# Patient Record
Sex: Male | Born: 1985
Health system: Southern US, Community
[De-identification: ages and names within clinical notes are randomized; demographics above are authoritative.]

## PROBLEM LIST (undated history)

## (undated) DIAGNOSIS — M1990 Unspecified osteoarthritis, unspecified site: Secondary | ICD-10-CM

## (undated) DIAGNOSIS — G4730 Sleep apnea, unspecified: Secondary | ICD-10-CM

## (undated) DIAGNOSIS — F419 Anxiety disorder, unspecified: Secondary | ICD-10-CM

## (undated) DIAGNOSIS — I10 Essential (primary) hypertension: Secondary | ICD-10-CM

## (undated) HISTORY — PX: KNEE SURGERY: SHX244

---
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
## 2005-03-11 ENCOUNTER — Emergency Department (HOSPITAL_COMMUNITY): Admission: EM | Admit: 2005-03-11 | Discharge: 2005-03-11 | Payer: Self-pay | Admitting: Emergency Medicine

## 2006-10-07 ENCOUNTER — Emergency Department (HOSPITAL_COMMUNITY): Admission: EM | Admit: 2006-10-07 | Discharge: 2006-10-07 | Payer: Self-pay | Admitting: Emergency Medicine

## 2006-10-12 ENCOUNTER — Emergency Department (HOSPITAL_COMMUNITY): Admission: EM | Admit: 2006-10-12 | Discharge: 2006-10-12 | Payer: Self-pay | Admitting: Emergency Medicine

## 2007-04-17 ENCOUNTER — Emergency Department (HOSPITAL_COMMUNITY): Admission: EM | Admit: 2007-04-17 | Discharge: 2007-04-17 | Payer: Self-pay | Admitting: Family Medicine

## 2007-04-20 ENCOUNTER — Emergency Department (HOSPITAL_COMMUNITY): Admission: EM | Admit: 2007-04-20 | Discharge: 2007-04-20 | Payer: Self-pay | Admitting: Family Medicine

## 2008-09-24 ENCOUNTER — Emergency Department (HOSPITAL_COMMUNITY): Admission: EM | Admit: 2008-09-24 | Discharge: 2008-09-24 | Payer: Self-pay | Admitting: Family Medicine

## 2009-02-19 ENCOUNTER — Emergency Department (HOSPITAL_COMMUNITY): Admission: EM | Admit: 2009-02-19 | Discharge: 2009-02-19 | Payer: Self-pay | Admitting: Emergency Medicine

## 2009-02-21 ENCOUNTER — Emergency Department (HOSPITAL_COMMUNITY): Admission: EM | Admit: 2009-02-21 | Discharge: 2009-02-21 | Payer: Self-pay | Admitting: Emergency Medicine

## 2009-09-14 ENCOUNTER — Inpatient Hospital Stay (HOSPITAL_COMMUNITY): Admission: EM | Admit: 2009-09-14 | Discharge: 2009-09-20 | Payer: Self-pay | Admitting: Emergency Medicine

## 2009-10-08 ENCOUNTER — Encounter: Admission: RE | Admit: 2009-10-08 | Discharge: 2010-01-06 | Payer: Self-pay | Admitting: Orthopedic Surgery

## 2010-04-08 ENCOUNTER — Emergency Department (HOSPITAL_COMMUNITY): Admission: EM | Admit: 2010-04-08 | Discharge: 2010-04-08 | Payer: Self-pay | Admitting: Family Medicine

## 2010-08-02 LAB — URINALYSIS, ROUTINE W REFLEX MICROSCOPIC
Bilirubin Urine: NEGATIVE
Hgb urine dipstick: NEGATIVE
Specific Gravity, Urine: 1.022 (ref 1.005–1.030)
pH: 6.5 (ref 5.0–8.0)

## 2010-08-02 LAB — TSH
TSH: 1.356 u[IU]/mL (ref 0.350–4.500)
TSH: 1.365 u[IU]/mL (ref 0.350–4.500)

## 2010-08-02 LAB — COMPREHENSIVE METABOLIC PANEL
ALT: 22 U/L (ref 0–53)
Albumin: 4 g/dL (ref 3.5–5.2)
Alkaline Phosphatase: 86 U/L (ref 39–117)
Calcium: 9 mg/dL (ref 8.4–10.5)
Calcium: 9.3 mg/dL (ref 8.4–10.5)
Chloride: 106 mEq/L (ref 96–112)
GFR calc Af Amer: >60 mL/min (ref >60)
GFR calc non Af Amer: >60 mL/min (ref >60)
Glucose, Bld: 115 mg/dL — ABNORMAL HIGH (ref 70–99)
Potassium: 4.2 mEq/L (ref 3.5–5.1)
Sodium: 138 mEq/L (ref 135–145)
Total Bilirubin: 0.3 mg/dL (ref 0.3–1.2)
Total Protein: 7.7 g/dL (ref 6.0–8.3)

## 2010-08-02 LAB — CBC
HCT: 30.1 % — ABNORMAL LOW (ref 39.0–52.0)
HCT: 41 % (ref 39.0–52.0)
Hemoglobin: 10.3 g/dL — ABNORMAL LOW (ref 13.0–17.0)
Hemoglobin: 10.4 g/dL — ABNORMAL LOW (ref 13.0–17.0)
Hemoglobin: 11 g/dL — ABNORMAL LOW (ref 13.0–17.0)
Hemoglobin: 11.4 g/dL — ABNORMAL LOW (ref 13.0–17.0)
Hemoglobin: 14.1 g/dL (ref 13.0–17.0)
Hemoglobin: 15 g/dL (ref 13.0–17.0)
MCHC: 33.5 g/dL (ref 30.0–36.0)
MCHC: 34.1 g/dL (ref 30.0–36.0)
MCHC: 34.2 g/dL (ref 30.0–36.0)
MCHC: 34.3 g/dL (ref 30.0–36.0)
MCV: 89 fL (ref 78.0–100.0)
MCV: 90.1 fL (ref 78.0–100.0)
MCV: 90.2 fL (ref 78.0–100.0)
MCV: 90.8 fL (ref 78.0–100.0)
Platelets: 360 10*3/uL (ref 150–400)
Platelets: 454 10*3/uL — ABNORMAL HIGH (ref 150–400)
RBC: 3.26 MIL/uL — ABNORMAL LOW (ref 4.22–5.81)
RBC: 3.32 MIL/uL — ABNORMAL LOW (ref 4.22–5.81)
RBC: 3.71 MIL/uL — ABNORMAL LOW (ref 4.22–5.81)
RBC: 4.6 MIL/uL (ref 4.22–5.81)
RBC: 4.9 MIL/uL (ref 4.22–5.81)
RDW: 13.4 % (ref 11.5–15.5)
RDW: 13.8 % (ref 11.5–15.5)
RDW: 14 % (ref 11.5–15.5)
WBC: 15 10*3/uL — ABNORMAL HIGH (ref 4.0–10.5)
WBC: 15.1 10*3/uL — ABNORMAL HIGH (ref 4.0–10.5)
WBC: 16.1 10*3/uL — ABNORMAL HIGH (ref 4.0–10.5)

## 2010-08-02 LAB — URINALYSIS, MICROSCOPIC ONLY
Bilirubin Urine: NEGATIVE
Glucose, UA: NEGATIVE mg/dL
Ketones, ur: 15 mg/dL — AB
Nitrite: NEGATIVE
Specific Gravity, Urine: 1.034 — ABNORMAL HIGH (ref 1.005–1.030)

## 2010-08-02 LAB — BASIC METABOLIC PANEL
BUN: 7 mg/dL (ref 6–23)
CO2: 29 mEq/L (ref 19–32)
CO2: 32 mEq/L (ref 19–32)
CO2: 32 mEq/L (ref 19–32)
Calcium: 8.8 mg/dL (ref 8.4–10.5)
Calcium: 8.8 mg/dL (ref 8.4–10.5)
Calcium: 8.8 mg/dL (ref 8.4–10.5)
Calcium: 9.4 mg/dL (ref 8.4–10.5)
Chloride: 98 mEq/L (ref 96–112)
Chloride: 98 mEq/L (ref 96–112)
Creatinine, Ser: 0.89 mg/dL (ref 0.4–1.5)
Creatinine, Ser: 0.92 mg/dL (ref 0.4–1.5)
Creatinine, Ser: 0.96 mg/dL (ref 0.4–1.5)
GFR calc Af Amer: >60 mL/min (ref >60)
GFR calc Af Amer: >60 mL/min (ref >60)
GFR calc Af Amer: >60 mL/min (ref >60)
GFR calc non Af Amer: >60 mL/min (ref >60)
GFR calc non Af Amer: >60 mL/min (ref >60)
Glucose, Bld: 102 mg/dL — ABNORMAL HIGH (ref 70–99)
Glucose, Bld: 92 mg/dL (ref 70–99)
Potassium: 4 mEq/L (ref 3.5–5.1)
Sodium: 134 mEq/L — ABNORMAL LOW (ref 135–145)
Sodium: 134 mEq/L — ABNORMAL LOW (ref 135–145)
Sodium: 135 mEq/L (ref 135–145)
Sodium: 137 mEq/L (ref 135–145)

## 2010-08-02 LAB — OPIATE, QUANTITATIVE, URINE
Hydrocodone: NEGATIVE NG/ML
Morphine, Confirm: 15226 NG/ML — ABNORMAL HIGH
Oxymorphone: NEGATIVE NG/ML

## 2010-08-02 LAB — URINE CULTURE: Colony Count: NO GROWTH

## 2010-08-02 LAB — LACTIC ACID, PLASMA: Lactic Acid, Venous: 3.6 mmol/L — ABNORMAL HIGH (ref 0.5–2.2)

## 2010-08-02 LAB — CK TOTAL AND CKMB (NOT AT ARMC)
CK, MB: 1.7 ng/mL (ref 0.3–4.0)
Total CK: 174 U/L (ref 7–232)

## 2010-08-02 LAB — POCT I-STAT, CHEM 8
Calcium, Ion: 1.07 mmol/L — ABNORMAL LOW (ref 1.12–1.32)
Chloride: 107 mEq/L (ref 96–112)
Hemoglobin: 16.3 g/dL (ref 13.0–17.0)
Potassium: 3.6 mEq/L (ref 3.5–5.1)
Sodium: 142 mEq/L (ref 135–145)
TCO2: 22 mmol/L (ref 0–100)

## 2010-08-02 LAB — HIGH SENSITIVITY CRP: CRP, High Sensitivity: 183.5 mg/L — ABNORMAL HIGH

## 2010-08-02 LAB — HEMOGLOBIN A1C
Hgb A1c MFr Bld: 5.7 % — ABNORMAL HIGH (ref <5.7)
Mean Plasma Glucose: 117 mg/dL — ABNORMAL HIGH (ref <117)

## 2010-08-02 LAB — DRUGS OF ABUSE SCREEN W/O ALC, ROUTINE URINE
Barbiturate Quant, Ur: NEGATIVE
Cocaine Metabolites: NEGATIVE
Creatinine,U: 225 mg/dL
Marijuana Metabolite: NEGATIVE
Opiate Screen, Urine: POSITIVE — AB
Propoxyphene: NEGATIVE

## 2010-08-02 LAB — SEDIMENTATION RATE: Sed Rate: 109 mm/hr — ABNORMAL HIGH (ref 0–16)

## 2010-08-02 LAB — PROTIME-INR: Prothrombin Time: 12.7 seconds (ref 11.6–15.2)

## 2010-08-02 LAB — SAMPLE TO BLOOD BANK

## 2010-08-02 LAB — LIPID PANEL
LDL Cholesterol: 156 mg/dL — ABNORMAL HIGH (ref 0–99)
Triglycerides: 65 mg/dL (ref <150)

## 2010-08-02 LAB — DIFFERENTIAL
Basophils Absolute: 0.1 10*3/uL (ref 0.0–0.1)
Eosinophils Absolute: 0.3 10*3/uL (ref 0.0–0.7)
Eosinophils Relative: 2 % (ref 0–5)
Lymphs Abs: 1.7 10*3/uL (ref 0.7–4.0)
Neutrophils Relative %: 76 % (ref 43–77)

## 2010-08-02 LAB — APTT: aPTT: 28 seconds (ref 24–37)

## 2010-08-02 LAB — TROPONIN I: Troponin I: 0.03 ng/mL (ref 0.00–0.06)

## 2010-08-02 LAB — D-DIMER, QUANTITATIVE: D-Dimer, Quant: 0.93 ug/mL-FEU — ABNORMAL HIGH (ref 0.00–0.48)

## 2010-08-23 LAB — POCT I-STAT, CHEM 8
Calcium, Ion: 1.19 mmol/L (ref 1.12–1.32)
Glucose, Bld: 94 mg/dL (ref 70–99)
HCT: 47 % (ref 39.0–52.0)
Hemoglobin: 16 g/dL (ref 13.0–17.0)
TCO2: 26 mmol/L (ref 0–100)

## 2010-08-23 LAB — POCT RAPID STREP A (OFFICE): Streptococcus, Group A Screen (Direct): NEGATIVE

## 2010-12-29 ENCOUNTER — Emergency Department (HOSPITAL_COMMUNITY): Payer: Self-pay

## 2010-12-29 ENCOUNTER — Emergency Department (HOSPITAL_COMMUNITY)
Admission: EM | Admit: 2010-12-29 | Discharge: 2010-12-29 | Disposition: A | Discharge status: Home / Self Care | Payer: Self-pay | Attending: Emergency Medicine | Admitting: Emergency Medicine

## 2010-12-29 DIAGNOSIS — I10 Essential (primary) hypertension: Secondary | ICD-10-CM | POA: Insufficient documentation

## 2010-12-29 DIAGNOSIS — R0602 Shortness of breath: Secondary | ICD-10-CM | POA: Insufficient documentation

## 2011-02-20 LAB — CULTURE, ROUTINE-ABSCESS

## 2011-02-25 ENCOUNTER — Emergency Department (HOSPITAL_COMMUNITY): Payer: Self-pay

## 2011-02-25 ENCOUNTER — Emergency Department (HOSPITAL_COMMUNITY)
Admission: EM | Admit: 2011-02-25 | Discharge: 2011-02-25 | Disposition: A | Discharge status: Home / Self Care | Payer: Self-pay | Attending: Emergency Medicine | Admitting: Emergency Medicine

## 2011-02-25 DIAGNOSIS — R05 Cough: Secondary | ICD-10-CM | POA: Insufficient documentation

## 2011-02-25 DIAGNOSIS — T46905A Adverse effect of unspecified agents primarily affecting the cardiovascular system, initial encounter: Secondary | ICD-10-CM | POA: Insufficient documentation

## 2011-02-25 DIAGNOSIS — R059 Cough, unspecified: Secondary | ICD-10-CM | POA: Insufficient documentation

## 2011-02-25 DIAGNOSIS — R079 Chest pain, unspecified: Secondary | ICD-10-CM | POA: Insufficient documentation

## 2011-02-25 DIAGNOSIS — I10 Essential (primary) hypertension: Secondary | ICD-10-CM | POA: Insufficient documentation

## 2011-02-25 DIAGNOSIS — R0602 Shortness of breath: Secondary | ICD-10-CM | POA: Insufficient documentation

## 2011-02-25 DIAGNOSIS — R42 Dizziness and giddiness: Secondary | ICD-10-CM | POA: Insufficient documentation

## 2011-02-25 LAB — URINE MICROSCOPIC-ADD ON

## 2011-02-25 LAB — POCT I-STAT, CHEM 8
BUN: 8 mg/dL (ref 6–23)
Calcium, Ion: 1.14 mmol/L (ref 1.12–1.32)
Chloride: 104 mEq/L (ref 96–112)
Creatinine, Ser: 1.2 mg/dL (ref 0.50–1.35)
Glucose, Bld: 100 mg/dL — ABNORMAL HIGH (ref 70–99)
HCT: 49 % (ref 39.0–52.0)
Hemoglobin: 16.7 g/dL (ref 13.0–17.0)
Potassium: 4.4 mEq/L (ref 3.5–5.1)
Sodium: 139 mEq/L (ref 135–145)
TCO2: 25 mmol/L (ref 0–100)

## 2011-02-25 LAB — URINALYSIS, ROUTINE W REFLEX MICROSCOPIC
Bilirubin Urine: NEGATIVE
Ketones, ur: NEGATIVE mg/dL
Specific Gravity, Urine: 1.012 (ref 1.005–1.030)
Urobilinogen, UA: 0.2 mg/dL (ref 0.0–1.0)
pH: 7.5 (ref 5.0–8.0)

## 2011-02-25 LAB — DIFFERENTIAL
Basophils Absolute: 0 10*3/uL (ref 0.0–0.1)
Eosinophils Relative: 0 % (ref 0–5)
Lymphocytes Relative: 8 % — ABNORMAL LOW (ref 12–46)
Lymphs Abs: 1.3 10*3/uL (ref 0.7–4.0)
Monocytes Absolute: 0.9 10*3/uL (ref 0.1–1.0)
Monocytes Relative: 5 % (ref 3–12)
Neutro Abs: 14.4 10*3/uL — ABNORMAL HIGH (ref 1.7–7.7)

## 2011-02-25 LAB — CBC
HCT: 45.2 % (ref 39.0–52.0)
Hemoglobin: 15.6 g/dL (ref 13.0–17.0)
MCHC: 34.5 g/dL (ref 30.0–36.0)
MCV: 87.1 fL (ref 78.0–100.0)

## 2011-02-27 ENCOUNTER — Emergency Department (HOSPITAL_COMMUNITY): Payer: Self-pay

## 2011-02-27 ENCOUNTER — Emergency Department (HOSPITAL_COMMUNITY)
Admission: EM | Admit: 2011-02-27 | Discharge: 2011-02-27 | Disposition: A | Discharge status: Home / Self Care | Payer: Self-pay | Attending: Emergency Medicine | Admitting: Emergency Medicine

## 2011-02-27 DIAGNOSIS — R05 Cough: Secondary | ICD-10-CM | POA: Insufficient documentation

## 2011-02-27 DIAGNOSIS — R42 Dizziness and giddiness: Secondary | ICD-10-CM | POA: Insufficient documentation

## 2011-02-27 DIAGNOSIS — D72829 Elevated white blood cell count, unspecified: Secondary | ICD-10-CM | POA: Insufficient documentation

## 2011-02-27 DIAGNOSIS — I10 Essential (primary) hypertension: Secondary | ICD-10-CM | POA: Insufficient documentation

## 2011-02-27 DIAGNOSIS — R059 Cough, unspecified: Secondary | ICD-10-CM | POA: Insufficient documentation

## 2011-02-27 DIAGNOSIS — R0602 Shortness of breath: Secondary | ICD-10-CM | POA: Insufficient documentation

## 2011-02-27 LAB — POCT I-STAT TROPONIN I: Troponin i, poc: 0.01 ng/mL (ref 0.00–0.08)

## 2011-02-27 LAB — CBC
MCHC: 34.9 g/dL (ref 30.0–36.0)
RDW: 14 % (ref 11.5–15.5)

## 2011-02-27 MED ORDER — IOHEXOL 300 MG/ML  SOLN: 100.0000 mL | Freq: Once | INTRAMUSCULAR | Status: DC | PRN

## 2011-04-22 ENCOUNTER — Encounter: Payer: Self-pay | Admitting: *Deleted

## 2011-04-22 ENCOUNTER — Other Ambulatory Visit: Payer: Self-pay

## 2011-04-22 ENCOUNTER — Emergency Department (INDEPENDENT_AMBULATORY_CARE_PROVIDER_SITE_OTHER)
Admission: EM | Admit: 2011-04-22 | Discharge: 2011-04-22 | Disposition: A | Discharge status: Home / Self Care | Payer: Self-pay | Source: Home / Self Care | Attending: Family Medicine | Admitting: Family Medicine

## 2011-04-22 DIAGNOSIS — IMO0001 Reserved for inherently not codable concepts without codable children: Secondary | ICD-10-CM

## 2011-04-22 HISTORY — DX: Essential (primary) hypertension: I10

## 2011-04-22 NOTE — ED Notes (Addendum)
C/O intermittent left upper arm pains w/ soreness in left chest associated w/ SOB since yesterday.   CP non-reproducible when present.  Denies any pain at present.

## 2011-04-22 NOTE — ED Provider Notes (Signed)
History     CSN: [id number] Arrival date & time: 04/22/2011 11:50 AM   First MD Initiated Contact with Patient 04/22/11 1122      Chief Complaint  Patient presents with  . Arm Pain  . Chest Pain    (Consider location/radiation/quality/duration/timing/severity/associated sxs/prior treatment) HPI Comments: Nathan Nelson presents for evaluation of LEFT arm pain intermittently over the last week. He reports a hx of HTN and takes HCTZ. He also reports some RIGHT shoulder pain intermittently, at rest and with movement. He denies any chest pain or shortness of breath. He works at [employer] and does a lot of reaching and repetitive motions with his arms.   Patient is a 25 y.o. male presenting with extremity pain. The history is provided by the patient.  Extremity Pain This is a new problem. The current episode started more than 2 days ago. The problem has not changed since onset.The symptoms are aggravated by nothing. The symptoms are relieved by nothing. He has tried nothing for the symptoms. The treatment provided no relief.    Past Medical History  Diagnosis Date  . Hypertension     Past Surgical History  Procedure Date  . Knee surgery     No family history on file.  History  Substance Use Topics  . Smoking status: Current Some Day Smoker    Types: Cigarettes  . Smokeless tobacco: Not on file  . Alcohol Use: Yes     occasionally      Review of Systems  Allergies  Lisinopril  Home Medications   Current Outpatient Rx  Name Route Sig Dispense Refill  . HYDROCHLOROTHIAZIDE PO Oral Take by mouth daily.      Marland Kitchen UNKNOWN TO PATIENT  Another HTN med; unk name       BP 148/76  Pulse 81  Temp(Src) 98.4 F (36.9 C) (Oral)  Resp 16  SpO2 100%  Physical Exam  ED Course  Procedures (including critical care time)  Labs Reviewed - No data to display No results found.   1. Myalgia and myositis, unspecified       MDM  ECG: normal sinus rhythm with sinus arrhythmia, rate  71       Richardo Priest, MD 04/22/11 1244

## 2011-05-11 ENCOUNTER — Emergency Department (HOSPITAL_COMMUNITY)
Admission: EM | Admit: 2011-05-11 | Discharge: 2011-05-11 | Disposition: A | Discharge status: Home / Self Care | Payer: 59 | Attending: Emergency Medicine | Admitting: Emergency Medicine

## 2011-05-11 ENCOUNTER — Encounter (HOSPITAL_COMMUNITY): Payer: Self-pay | Admitting: *Deleted

## 2011-05-11 ENCOUNTER — Other Ambulatory Visit: Payer: Self-pay

## 2011-05-11 ENCOUNTER — Emergency Department (HOSPITAL_COMMUNITY): Payer: 59

## 2011-05-11 DIAGNOSIS — R0602 Shortness of breath: Secondary | ICD-10-CM | POA: Insufficient documentation

## 2011-05-11 DIAGNOSIS — I10 Essential (primary) hypertension: Secondary | ICD-10-CM | POA: Insufficient documentation

## 2011-05-11 DIAGNOSIS — R079 Chest pain, unspecified: Secondary | ICD-10-CM | POA: Insufficient documentation

## 2011-05-11 LAB — CBC
HCT: 44.9 % (ref 39.0–52.0)
MCV: 89.4 fL (ref 78.0–100.0)
RBC: 5.02 MIL/uL (ref 4.22–5.81)
WBC: 13.4 10*3/uL — ABNORMAL HIGH (ref 4.0–10.5)

## 2011-05-11 LAB — RAPID URINE DRUG SCREEN, HOSP PERFORMED: Barbiturates: NOT DETECTED

## 2011-05-11 LAB — DIFFERENTIAL
Eosinophils Relative: 1 % (ref 0–5)
Lymphocytes Relative: 19 % (ref 12–46)
Lymphs Abs: 2.5 10*3/uL (ref 0.7–4.0)
Monocytes Absolute: 1.1 10*3/uL — ABNORMAL HIGH (ref 0.1–1.0)
Neutro Abs: 9.7 10*3/uL — ABNORMAL HIGH (ref 1.7–7.7)

## 2011-05-11 LAB — POCT I-STAT, CHEM 8
BUN: 14 mg/dL (ref 6–23)
Calcium, Ion: 1.16 mmol/L (ref 1.12–1.32)
Chloride: 100 mEq/L (ref 96–112)
Creatinine, Ser: 1 mg/dL (ref 0.50–1.35)
Sodium: 139 mEq/L (ref 135–145)
TCO2: 28 mmol/L (ref 0–100)

## 2011-05-11 NOTE — ED Provider Notes (Signed)
History     CSN: [id number]  Arrival date & time 05/11/11  1634   First MD Initiated Contact with Patient 05/11/11 2005      Chief Complaint  Patient presents with  . Chest Pain  . Shortness of Breath     HPI  History provided by the patient. Patient is a 25 year old male with history of hypertension who presents with complaints of intermittent left chest discomfort and shortness of breath for the past several weeks. Patient reports being evaluated at urgent care earlier this month for similar symptoms. She states that today he was at work and felt slightly more significant symptoms. Pain is described as a tight pressure feeling made worse with some breathing and movements of arm and body. He denies any strenuous activity or lifting at work symptoms began. Symptoms are also not changed with eating or drinking. Patient denies any cough, hemoptysis, recent travel, swelling in extremities lightheadedness or near syncope. Patient reports being a former tobacco smoker. Patient denies other drug use. he does admit to alcohol use occasionally. Patient has no other significant past medical history.    Past Medical History  Diagnosis Date  . Hypertension     Past Surgical History  Procedure Date  . Knee surgery     History reviewed. No pertinent family history.  History  Substance Use Topics  . Smoking status: Former Smoker    Types: Cigarettes  . Smokeless tobacco: Not on file  . Alcohol Use: Yes     occasionally      Review of Systems  Constitutional: Negative for fever, chills, diaphoresis and appetite change.  Respiratory: Positive for shortness of breath. Negative for cough and wheezing.   Cardiovascular: Positive for chest pain and palpitations. Negative for leg swelling.  Gastrointestinal: Negative for nausea, vomiting, abdominal pain, diarrhea and constipation.  All other systems reviewed and are negative.    Allergies  Lisinopril  Home Medications   Current  Outpatient Rx  Name Route Sig Dispense Refill  . AMLODIPINE BESYLATE 5 MG PO TABS Oral Take 5 mg by mouth daily.      Marland Kitchen HYDROCHLOROTHIAZIDE 25 MG PO TABS Oral Take 25 mg by mouth daily.      Carma Leaven M PLUS PO TABS Oral Take 2 tablets by mouth daily.        BP 142/90  Pulse 65  Temp(Src) 98.1 F (36.7 C) (Oral)  Resp 21  SpO2 100%  Physical Exam  Nursing note and vitals reviewed. Constitutional: He is oriented to person, place, and time. He appears well-developed and well-nourished.  HENT:  Head: Normocephalic and atraumatic.  Mouth/Throat: Oropharynx is clear and moist.  Eyes: Conjunctivae and EOM are normal. Pupils are equal, round, and reactive to light.  Neck: Normal range of motion. Neck supple. No tracheal deviation present.  Cardiovascular: Normal rate, regular rhythm and normal heart sounds.   No murmur heard. Pulmonary/Chest: Effort normal and breath sounds normal. No respiratory distress. He has no wheezes.       Mild tenderness to palpation under her left breast and anterior left shoulder.  Abdominal: Soft. Bowel sounds are normal. There is no tenderness. There is no rebound and no guarding.  Musculoskeletal: He exhibits no edema and no tenderness.  Neurological: He is alert and oriented to person, place, and time.  Skin: Skin is warm.       Large hyperpigmented patch over left shoulder and chest consistent with birthmark.  Psychiatric: He has a normal mood and affect.  His behavior is normal.    ED Course  Procedures (including critical care time)   Labs Reviewed  URINE RAPID DRUG SCREEN (HOSP PERFORMED)  CBC  DIFFERENTIAL  I-STAT, CHEM 8   Results for orders placed during the hospital encounter of 05/11/11  URINE RAPID DRUG SCREEN (HOSP PERFORMED)      Component Value Range   Opiates NONE DETECTED  NONE DETECTED    Cocaine NONE DETECTED  NONE DETECTED    Benzodiazepines NONE DETECTED  NONE DETECTED    Amphetamines NONE DETECTED  NONE DETECTED     Tetrahydrocannabinol POSITIVE (*) NONE DETECTED    Barbiturates NONE DETECTED  NONE DETECTED   CBC      Component Value Range   WBC 13.4 (*) 4.0 - 10.5 (K/uL)   RBC 5.02  4.22 - 5.81 (MIL/uL)   Hemoglobin 15.0  13.0 - 17.0 (g/dL)   HCT 14.7  82.9 - 56.2 (%)   MCV 89.4  78.0 - 100.0 (fL)   MCH 29.9  26.0 - 34.0 (pg)   MCHC 33.4  30.0 - 36.0 (g/dL)   RDW 13.0  86.5 - 78.4 (%)   Platelets 363  150 - 400 (K/uL)  DIFFERENTIAL      Component Value Range   Neutrophils Relative 72  43 - 77 (%)   Neutro Abs 9.7 (*) 1.7 - 7.7 (K/uL)   Lymphocytes Relative 19  12 - 46 (%)   Lymphs Abs 2.5  0.7 - 4.0 (K/uL)   Monocytes Relative 8  3 - 12 (%)   Monocytes Absolute 1.1 (*) 0.1 - 1.0 (K/uL)   Eosinophils Relative 1  0 - 5 (%)   Eosinophils Absolute 0.1  0.0 - 0.7 (K/uL)   Basophils Relative 0  0 - 1 (%)   Basophils Absolute 0.0  0.0 - 0.1 (K/uL)  POCT I-STAT, CHEM 8      Component Value Range   Sodium 139  135 - 145 (mEq/L)   Potassium 3.1 (*) 3.5 - 5.1 (mEq/L)   Chloride 100  96 - 112 (mEq/L)   BUN 14  6 - 23 (mg/dL)   Creatinine, Ser 6.96  0.50 - 1.35 (mg/dL)   Glucose, Bld 80  70 - 99 (mg/dL)   Calcium, Ion 2.95  2.84 - 1.32 (mmol/L)   TCO2 28  0 - 100 (mmol/L)   Hemoglobin 16.0  13.0 - 17.0 (g/dL)   HCT 13.2  44.0 - 10.2 (%)     Dg Chest 2 View  05/11/2011  *RADIOLOGY REPORT*  Clinical Data: Left anterior chest pain, shortness of breath and weakness.  History of smoking.  CHEST - 2 VIEW  Comparison: Chest radiograph performed 02/25/2011, and CTA of the chest performed 02/27/2011  Findings: The lungs are well-aerated and clear.  There is no evidence of focal opacification, pleural effusion or pneumothorax.  The heart is normal in size; the mediastinal contour is within normal limits.  No acute osseous abnormalities are seen.  IMPRESSION: No acute cardiopulmonary process seen.  Original Report Authenticated By: Tonia Ghent, M.D.     1. Chest pain       MDM  8:45 PM patient  seen and evaluated. Patient in no acute distress.  Pt is PERC negative.  Patient continues to do well with no symptoms at this time. She continues have normal respirations and good O2 sats    Date: 05/11/2011  Rate: 62  Rhythm: normal sinus rhythm and sinus arrhythmia  QRS Axis: normal  Intervals:  normal  ST/T Wave abnormalities: normal  Conduction Disutrbances:none  Narrative Interpretation:   Old EKG Reviewed: none available     Angus Seller, [place] 05/12/11 [phone]

## 2011-05-11 NOTE — ED Notes (Signed)
Pt states that he has been having left chest pain x2-3 days. Pain feels like a cramping and radiates to his back. Pt states that he has been coughing for the last 2-3 days. Pain increases with coughing. Currently pain is 6 out of 10. No history of asthma. Lung sounds are clear. No signs of respiratory or cardiac distress.  Heart sounds WNL. Pt stated that he felt lightheaded at work, but does not feel that way anymore.

## 2011-05-11 NOTE — ED Notes (Signed)
Pt reports several day hx of left sided chest pain under breast associated with sob, states pain is intermittent and states pain increases with certain movements. Pt has hx of same. Pt states he has been having right shoulder discomfort radiating into back also.

## 2011-05-15 ENCOUNTER — Emergency Department (HOSPITAL_COMMUNITY)
Admission: EM | Admit: 2011-05-15 | Discharge: 2011-05-15 | Discharge status: Against Medical Advice | Payer: 59 | Attending: Emergency Medicine | Admitting: Emergency Medicine

## 2011-05-15 ENCOUNTER — Encounter (HOSPITAL_COMMUNITY): Payer: Self-pay | Admitting: *Deleted

## 2011-05-15 DIAGNOSIS — Z0389 Encounter for observation for other suspected diseases and conditions ruled out: Secondary | ICD-10-CM | POA: Insufficient documentation

## 2011-05-15 NOTE — ED Provider Notes (Signed)
Medical screening examination/treatment/procedure(s) were performed by non-physician practitioner and as supervising physician I was immediately available for consultation/collaboration.  Jillane Po L Aariyah Sampey, MD 05/15/11 1643 

## 2011-05-15 NOTE — ED Notes (Signed)
Pt states "I have been taking bp meds, my heart starts beating real fast, I feel like I'm getting SOB, I've been seen @ [facility] about 8 times for the same"

## 2011-05-15 NOTE — ED Notes (Signed)
Pt. States he does not want to wait any longer. Advised he has already seen the nurse and the # of pts infront of him. He still chose to leave.

## 2011-05-16 ENCOUNTER — Emergency Department (HOSPITAL_COMMUNITY)
Admission: EM | Admit: 2011-05-16 | Discharge: 2011-05-16 | Disposition: A | Discharge status: Home / Self Care | Payer: 59 | Source: Home / Self Care | Attending: Family Medicine | Admitting: Family Medicine

## 2011-05-16 ENCOUNTER — Encounter (HOSPITAL_COMMUNITY): Payer: Self-pay | Admitting: *Deleted

## 2011-05-16 DIAGNOSIS — I10 Essential (primary) hypertension: Secondary | ICD-10-CM

## 2011-05-16 DIAGNOSIS — F411 Generalized anxiety disorder: Secondary | ICD-10-CM

## 2011-05-16 DIAGNOSIS — F419 Anxiety disorder, unspecified: Secondary | ICD-10-CM

## 2011-05-16 NOTE — ED Notes (Addendum)
Pt presented to [facility] ed yesterday anxiety did not wait to be seen - today pt feeling better - per pt remains with some feeling of anxiety thinks bp pill needs to be changed  - yesterday took a friends klonopin and felt better

## 2011-05-16 NOTE — ED Provider Notes (Signed)
History     CSN: [id number]  Arrival date & time 05/16/11  1134   First MD Initiated Contact with Patient 05/16/11 1307      Chief Complaint  Patient presents with  . Anxiety  . Hypertension    (Consider location/radiation/quality/duration/timing/severity/associated sxs/prior treatment) Patient is a 26 y.o. male presenting with anxiety and hypertension. The history is provided by the patient.  Anxiety This is a chronic problem. Episode onset: seen at [facility] yest for anxiety, took a klonopin from friend, , feels better today, wonders about an rx for same, trying to get off of all meds, drugs and caffeine, nicotine. The problem has been resolved. Pertinent negatives include no chest pain, no abdominal pain, no headaches and no shortness of breath.  Hypertension Pertinent negatives include no chest pain, no abdominal pain, no headaches and no shortness of breath.    Past Medical History  Diagnosis Date  . Hypertension     Past Surgical History  Procedure Date  . Knee surgery     History reviewed. No pertinent family history.  History  Substance Use Topics  . Smoking status: Former Smoker    Types: Cigarettes  . Smokeless tobacco: Not on file  . Alcohol Use: Yes     occasionally      Review of Systems  Constitutional: Negative.   HENT: Negative.   Respiratory: Negative for shortness of breath.   Cardiovascular: Negative for chest pain.  Gastrointestinal: Negative.  Negative for abdominal pain.  Neurological: Negative for headaches.  Psychiatric/Behavioral: The patient is nervous/anxious.     Allergies  Lisinopril  Home Medications   Current Outpatient Rx  Name Route Sig Dispense Refill  . AMLODIPINE BESYLATE 5 MG PO TABS Oral Take 5 mg by mouth daily.      Marland Kitchen HYDROCHLOROTHIAZIDE 25 MG PO TABS Oral Take 25 mg by mouth daily.      Carma Leaven M PLUS PO TABS Oral Take 2 tablets by mouth daily.        BP 153/94  Pulse 72  Temp(Src) 98.5 F (36.9 C) (Oral)  Resp  16  SpO2 97%  Physical Exam  Nursing note and vitals reviewed. Constitutional: He is oriented to person, place, and time. He appears well-developed and well-nourished.  HENT:  Mouth/Throat: Oropharynx is clear and moist.  Eyes: Conjunctivae are normal. Pupils are equal, round, and reactive to light.  Neck: Neck supple.  Cardiovascular: Normal rate, regular rhythm, normal heart sounds and intact distal pulses.   Pulmonary/Chest: Effort normal and breath sounds normal.  Abdominal: Soft. Bowel sounds are normal.  Neurological: He is alert and oriented to person, place, and time.  Skin: Skin is warm and dry.  Psychiatric: He has a normal mood and affect. His behavior is normal. Thought content normal.    ED Course  Procedures (including critical care time)  Labs Reviewed - No data to display No results found.   1. Hypertension   2. Anxiety disorder       MDM          Barkley Bruns, MD 05/16/11 1344
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
## 2011-06-20 ENCOUNTER — Ambulatory Visit (HOSPITAL_BASED_OUTPATIENT_CLINIC_OR_DEPARTMENT_OTHER): Payer: 59 | Attending: Internal Medicine | Admitting: Radiology

## 2011-06-20 VITALS — Ht 65.0 in | Wt 200.0 lb

## 2011-06-20 DIAGNOSIS — G4733 Obstructive sleep apnea (adult) (pediatric): Secondary | ICD-10-CM | POA: Insufficient documentation

## 2011-06-25 DIAGNOSIS — G4733 Obstructive sleep apnea (adult) (pediatric): Secondary | ICD-10-CM

## 2011-06-26 NOTE — Procedures (Addendum)
[name], Nathan Nelson                ACCOUNT NO.:  [id number]  MEDICAL RECORD NO.:  [id number]          PATIENT TYPE:  OUT  LOCATION:  SLEEP CENTER                 FACILITY:  [facility]  PHYSICIAN:  Clinton D. Maple Hudson, MD, FCCP, FACPDATE OF BIRTH:  28-May-1984  DATE OF STUDY:  06/20/2011                           NOCTURNAL POLYSOMNOGRAM  REFERRING PHYSICIAN:  Fleet Contras, M.D.  REFERRING DOCTOR:  Fleet Contras, MD.  INDICATION FOR STUDY:  Hypersomnia with sleep apnea.  EPWORTH SLEEPINESS SCORE:  8/24.  BMI 33.3.  Weight 200 pounds.  Height 65 inches.  Neck 15.5 inches.  MEDICATIONS:  Home medications are charted and reviewed.  SLEEP ARCHITECTURE:  Total sleep time 252.5 minutes with sleep efficiency 71%.  Stage I was 6.9%, stage II 73.9%, stage III 2%, REM 17.2% of total sleep time.  Sleep latency 87.5 minutes.  REM latency 70 minutes.  Awake after sleep onset 16.5 minutes.  Arousal index 17.3.  BEDTIME MEDICATION:  None.  RESPIRATORY DATA:  Apnea-hypopnea index (AHI) 10.7 per hour.  A total of 45 events was scored including 14 obstructive apneas, 1 mixed apnea, and 30 hypopneas.  Events were not positional.  REM AHI 5.5 per hour.  There were insufficient numbers of early events to permit application of split protocol, CPAP titration on this study night.  OXYGEN DATA:  Moderately loud snoring with oxygen desaturation to a nadir of 84% and a mean oxygen saturation through the study of 95.4% on room air.  CARDIAC DATA:  Sinus rhythm with occasional PVCs.  MOVEMENT-PARASOMNIA:  No significant movement disturbance.  No bathroom trips.  IMPRESSIONS-RECOMMENDATIONS: 1. Sleep architecture was noted for late onset of sleep around 12:30     a.m.  No bedtime medication. 2. Mild obstructive sleep apnea/hypopnea syndrome, AHI 10.7 per hour     with non-positional events, moderately loud snoring, and oxygen     desaturation to a nadir of 84% with mean oxygen saturation through     the  study of 95.4% on room air. 3. There were insufficient numbers of early events together with     delayed sleep onset, preventing application of     split protocol, CPAP titration on this study night.  Conservative     measures may include weight loss and encouragement to sleep off     flat of back.  If appropriate, consider return for dedicated CPAP     titration study.     Clinton D. Maple Hudson, MD, [facility], FACP Diplomate, Biomedical engineer of Sleep Medicine Electronically Signed    CDY/MEDQ  D:  06/25/2011 09:44:46  T:  06/26/2011 05:32:56  Job:  [id number]

## 2011-06-27 ENCOUNTER — Encounter (HOSPITAL_COMMUNITY): Payer: Self-pay | Admitting: *Deleted

## 2011-06-27 ENCOUNTER — Emergency Department (HOSPITAL_COMMUNITY)
Admission: EM | Admit: 2011-06-27 | Discharge: 2011-06-27 | Disposition: A | Discharge status: Home / Self Care | Payer: 59 | Attending: Emergency Medicine | Admitting: Emergency Medicine

## 2011-06-27 DIAGNOSIS — S0181XA Laceration without foreign body of other part of head, initial encounter: Secondary | ICD-10-CM

## 2011-06-27 DIAGNOSIS — S0120XA Unspecified open wound of nose, initial encounter: Secondary | ICD-10-CM | POA: Insufficient documentation

## 2011-06-27 DIAGNOSIS — S0180XA Unspecified open wound of other part of head, initial encounter: Secondary | ICD-10-CM | POA: Insufficient documentation

## 2011-06-27 DIAGNOSIS — I10 Essential (primary) hypertension: Secondary | ICD-10-CM | POA: Insufficient documentation

## 2011-06-27 MED ORDER — TETANUS-DIPHTH-ACELL PERTUSSIS 5-2.5-18.5 LF-MCG/0.5 IM SUSP
0.5000 mL | Freq: Once | INTRAMUSCULAR | Status: AC
  Administered 2011-06-27: 0.5 mL via INTRAMUSCULAR
  Filled 2011-06-27: qty 0.5

## 2011-06-27 NOTE — Discharge Instructions (Signed)
Your stitches will need to be removed in 5-7 days - this can be done at urgent care, at your primary care doctor's office, or here at the ER. The glue should fall off by itself. Keep the wounds clean and dry. Use antibacterial ointment such as Bacitracin or Neosporin over the areas. For scarring, you may use vitamin E, cocoa butter, or a scar cream called Mederma over the areas (once the stitches come out). Make sure to use sunscreen to the areas for at least the next year if you'll be spending a lot of time in the sun. You may use ibuprofen 800 mg over the counter three times a day as needed for headache. Return to the ER with bleeding from the areas, redness, or any other worrisome symptoms.  RESOURCE GUIDE  Dental Problems  Patients with Medicaid: [facility] [phone] [address]                                           [phone] [address] Phone:  [phone]                                                  Phone:  [phone]  If unable to pay or uninsured, contact:  [facility] or [facility]. to become qualified for the adult dental clinic.  Chronic Pain Problems Contact [facility]  [phone] Patients need to be referred by their primary care doctor.  Insufficient Money for Medicine Contact [employer] Way:  call "211" or [facility] 5746716386.  No Primary Care Doctor Call [facility]  770-557-6333 Other agencies that provide inexpensive medical care    [facility]  [phone]    [facility]  [phone]    [facility]  ([phone]    [facility]  [phone]    [facility]  [phone]    [facility]  [phone]  Psychological Services [facility]  [phone] [facility]  [phone] [facility]   [phone] (emergency services (959) [phone])  Substance Abuse Resources [facility]   864-251-7215 [facility] 540-597-0351 [facility] 847-107-8948 Floydene Flock (636)840-8857 Residential & Outpatient Substance Abuse Program  (715) [phone]  Abuse/Neglect [facility] Child Abuse Hotline (531) 161-5933 [facility] Child Abuse Hotline (463) 284-2905 (After Hours)  Emergency Shelter [facility] (218)569-8721  [facility] [place] [facility] (901)166-9479 [facility] [phone]  MRSA Hotline #:   7272491817    [facility] Resources  [facility]     [employer]                          [facility] Dept. [address] [place]                       [address]      [address]  [address]  Cristobal Goldmann Phone:  [phone]                                   Phone:  [phone]                 Phone:  [phone]  [facility] Phone:  [phone]  [facility] Child Abuse Hotline 848-826-0712 732-083-8406 (After Hours)  Facial Laceration A facial laceration is a cut on the face. Lacerations usually heal quickly, but they need special care to reduce scarring. It will take 1 to 2 years for the scar to lose its redness and to heal completely. TREATMENT  Some facial lacerations may not require closure. Some lacerations may not be able to be closed due to an increased risk of infection. It is important to see your caregiver as soon as possible after an injury to minimize the risk of infection and to maximize the opportunity for successful closure. If closure is appropriate, pain medicines may be given, if needed. The wound will be cleaned to help prevent infection. Your caregiver will use stitches (sutures), staples, wound glue (adhesive), or skin adhesive strips to repair the laceration. These tools bring the skin edges together to allow for faster  healing and a better cosmetic outcome. However, all wounds will heal with a scar.  Once the wound has healed, scarring can be minimized by covering the wound with sunscreen during the day for 1 full year. Use a sunscreen with an SPF of at least 30. Sunscreen helps to reduce the pigment that will form in the scar. When applying sunscreen to a completely healed wound, massage the scar for a few minutes to help reduce the appearance of the scar. Use circular motions with your fingertips, on and around the scar. Do not massage a healing wound. HOME CARE INSTRUCTIONS For sutures:  Keep the wound clean and dry.   If you were given a bandage (dressing), you should change it at least once a day. Also change the dressing if it becomes wet or dirty, or as directed by your caregiver.   Wash the wound with soap and water 2 times a day. Rinse the wound off with water to remove all soap. Pat the wound dry with a clean towel.   After cleaning, apply a thin layer of the antibiotic ointment recommended by your caregiver. This will help prevent infection and keep the dressing from sticking.   You may shower as usual after the first 24 hours. Do not soak the wound in water until the sutures are removed.   Only take over-the-counter or prescription medicines for pain, discomfort, or fever as directed by your caregiver.   Get your sutures removed as directed by your caregiver. With facial lacerations, sutures should usually be taken out after 4 to 5 days to avoid stitch marks.   Wait a few days after your sutures are removed before applying makeup.  For skin adhesive strips:  Keep the wound clean and dry.   Do not get the skin adhesive strips wet. You may bathe carefully, using caution to keep the wound dry.   If the wound gets wet, pat it dry with a clean towel.   Skin adhesive strips will fall off on their own. You  may trim the strips as the wound heals. Do not remove skin adhesive strips that are still  stuck to the wound. They will fall off in time.  For wound adhesive:  You may briefly wet your wound in the shower or bath. Do not soak or scrub the wound. Do not swim. Avoid periods of heavy perspiration until the skin adhesive has fallen off on its own. After showering or bathing, gently pat the wound dry with a clean towel.   Do not apply liquid medicine, cream medicine, ointment medicine, or makeup to your wound while the skin adhesive is in place. This may loosen the film before your wound is healed.   If a dressing is placed over the wound, be careful not to apply tape directly over the skin adhesive. This may cause the adhesive to be pulled off before the wound is healed.   Avoid prolonged exposure to sunlight or tanning lamps while the skin adhesive is in place. Exposure to ultraviolet light in the first year will darken the scar.   The skin adhesive will usually remain in place for 5 to 10 days, then naturally fall off the skin. Do not pick at the adhesive film.  You may need a tetanus shot if:  You cannot remember when you had your last tetanus shot.   You have never had a tetanus shot.  If you get a tetanus shot, your arm may swell, get red, and feel warm to the touch. This is common and not a problem. If you need a tetanus shot and you choose not to have one, there is a rare chance of getting tetanus. Sickness from tetanus can be serious. SEEK IMMEDIATE MEDICAL CARE IF:  You develop redness, pain, or swelling around the wound.   There is yellowish-white fluid (pus) coming from the wound.   You develop chills or a fever.  MAKE SURE YOU:  Understand these instructions.   Will watch your condition.   Will get help right away if you are not doing well or get worse.  Document Released: 06/08/2004 Document Revised: 01/11/2011 Document Reviewed: 10/24/2010 [facility] Patient Information 2012 [place], [place].
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
## 2011-06-27 NOTE — ED Provider Notes (Signed)
Medical screening examination/treatment/procedure(s) were performed by non-physician practitioner and as supervising physician I was immediately available for consultation/collaboration.   Glynn Octave, MD 06/27/11 [phone]

## 2011-07-03 ENCOUNTER — Encounter (HOSPITAL_COMMUNITY): Payer: Self-pay

## 2011-07-03 ENCOUNTER — Emergency Department (INDEPENDENT_AMBULATORY_CARE_PROVIDER_SITE_OTHER)
Admission: EM | Admit: 2011-07-03 | Discharge: 2011-07-03 | Disposition: A | Discharge status: Home Health | Payer: 59 | Source: Home / Self Care | Attending: Emergency Medicine | Admitting: Emergency Medicine

## 2011-07-03 DIAGNOSIS — Z4802 Encounter for removal of sutures: Secondary | ICD-10-CM

## 2011-07-03 NOTE — Discharge Instructions (Signed)
Do not get the steri strips wet. Do not peel them off. They will come off on their own in 3-5 days, and your cut will be fully healed at that time. No bacitracin near the steristrips.    Scar Minimization You will have a scar anytime you have surgery and a cut is made in the skin or you have something removed from your skin (mole, skin cancer, cyst). Although scars are unavoidable following surgery, there are ways to minimize their appearance. It is important to follow all the instructions you receive from your caregiver about wound care. How your wound heals will influence the appearance of your scar. If you do not follow the wound care instructions as directed, complications such as infection may occur. Wound instructions include keeping the wound clean, moist, and not letting the wound form a scab. Some people form scars that are raised and lumpy (hypertrophic) or larger than the initial wound (keloidal). HOME CARE INSTRUCTIONS   Follow wound care instructions as directed.   Keep the wound clean by washing it with soap and water.   Keep the wound moist with provided antibiotic cream or petroleum jelly until completely healed. Moisten twice a day for about 2 weeks.   Get stitches (sutures) taken out at the scheduled time.   Avoid touching or manipulating your wound unless needed. Wash your hands thoroughly before and after touching your wound.   Follow all restrictions such as limits on exercise or work. This depends on where your scar is located.   Keep the scar protected from sunburn. Cover the scar with sunscreen/sunblock with SPF 30 or higher.   Gently massage the scar using a circular motion to help minimize the appearance of the scar. Do this only after the wound has closed and all the sutures have been removed.   For hypertrophic or keloidal scars, there are several ways to treat and minimize their appearance. Methods include compression therapy, intralesional corticosteroids, laser  therapy, or surgery. These methods are performed by your caregiver.  Remember that the scar may appear lighter or darker than your normal skin color. This difference in color should even out with time. SEEK MEDICAL CARE IF:   You have an oral temperature above 102 F (38.9 C).   You develop signs of infection such as pain, redness, pus, and warmth.   You have questions or concerns.  Document Released: 10/19/2009 Document Revised: 12/28/2010 Document Reviewed: 10/19/2009 [facility] Patient Information 2012 [place], [place].
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
## 2011-07-03 NOTE — ED Notes (Signed)
Recheck and poss suture removal of laceration to face; NAD, no obvious signs of infection

## 2012-04-17 ENCOUNTER — Emergency Department (HOSPITAL_COMMUNITY): Payer: Self-pay

## 2012-04-17 ENCOUNTER — Encounter (HOSPITAL_COMMUNITY): Payer: Self-pay

## 2012-04-17 ENCOUNTER — Emergency Department (HOSPITAL_COMMUNITY)
Admission: EM | Admit: 2012-04-17 | Discharge: 2012-04-17 | Disposition: A | Discharge status: Home / Self Care | Payer: Self-pay | Attending: Emergency Medicine | Admitting: Emergency Medicine

## 2012-04-17 DIAGNOSIS — Z87891 Personal history of nicotine dependence: Secondary | ICD-10-CM | POA: Insufficient documentation

## 2012-04-17 DIAGNOSIS — I160 Hypertensive urgency: Secondary | ICD-10-CM

## 2012-04-17 DIAGNOSIS — R0602 Shortness of breath: Secondary | ICD-10-CM | POA: Insufficient documentation

## 2012-04-17 DIAGNOSIS — I10 Essential (primary) hypertension: Secondary | ICD-10-CM | POA: Insufficient documentation

## 2012-04-17 DIAGNOSIS — R42 Dizziness and giddiness: Secondary | ICD-10-CM | POA: Insufficient documentation

## 2012-04-17 LAB — CBC WITH DIFFERENTIAL/PLATELET
Basophils Relative: 1 % (ref 0–1)
Eosinophils Relative: 2 % (ref 0–5)
HCT: 46.9 % (ref 39.0–52.0)
Lymphs Abs: 1.6 10*3/uL (ref 0.7–4.0)
MCH: 29.8 pg (ref 26.0–34.0)
MCV: 89.5 fL (ref 78.0–100.0)
Monocytes Absolute: 1.1 10*3/uL — ABNORMAL HIGH (ref 0.1–1.0)
Monocytes Relative: 12 % (ref 3–12)
Neutro Abs: 5.8 10*3/uL (ref 1.7–7.7)
Platelets: 239 10*3/uL (ref 150–400)
RBC: 5.24 MIL/uL (ref 4.22–5.81)
WBC: 8.8 10*3/uL (ref 4.0–10.5)

## 2012-04-17 LAB — POCT I-STAT TROPONIN I: Troponin i, poc: 0.14 ng/mL (ref 0.00–0.08)

## 2012-04-17 LAB — COMPREHENSIVE METABOLIC PANEL
AST: 36 U/L (ref 0–37)
BUN: 13 mg/dL (ref 6–23)
CO2: 27 mEq/L (ref 19–32)
Calcium: 9.6 mg/dL (ref 8.4–10.5)
Chloride: 98 mEq/L (ref 96–112)
Creatinine, Ser: 1.03 mg/dL (ref 0.50–1.35)
GFR calc Af Amer: >90 mL/min (ref >90)
GFR calc non Af Amer: >90 mL/min (ref >90)
Glucose, Bld: 97 mg/dL (ref 70–99)
Total Bilirubin: 0.4 mg/dL (ref 0.3–1.2)

## 2012-04-17 MED ORDER — IOHEXOL 350 MG/ML SOLN
100.0000 mL | Freq: Once | INTRAVENOUS | Status: AC | PRN
  Administered 2012-04-17: 100 mL via INTRAVENOUS

## 2012-04-17 MED ORDER — AMLODIPINE BESYLATE 10 MG PO TABS: 10.0000 mg | ORAL_TABLET | Freq: Every day | ORAL | Status: DC

## 2012-04-17 MED ORDER — HYDROCHLOROTHIAZIDE 25 MG PO TABS: 25.0000 mg | ORAL_TABLET | Freq: Every day | ORAL | Status: DC

## 2012-04-17 MED ORDER — LABETALOL HCL 5 MG/ML IV SOLN
20.0000 mg | Freq: Once | INTRAVENOUS | Status: AC
  Administered 2012-04-17: 20 mg via INTRAVENOUS
  Filled 2012-04-17: qty 4

## 2012-04-17 NOTE — ED Notes (Signed)
Results of troponin shown to Dr. Freida Busman

## 2012-04-17 NOTE — ED Provider Notes (Signed)
History     CSN: [id number]  Arrival date & time 04/17/12  0814   First MD Initiated Contact with Patient 04/17/12 [phone]      Chief Complaint  Patient presents with  . Hypertension    (Consider location/radiation/quality/duration/timing/severity/associated sxs/prior treatment) HPI  She presents to the emergency department for evaluation of his high blood pressure.  The patient is normally on hydrochlorothiazide but has not taken it since this past summer he felt that  they are making him sick. He has family history of high blood pressure. His blood pressure is now in  the ED 217/147. He admits to feeling a bit light headed and having some shortness of breath with a  "weird sensation in his chest". He denies being sick recently with cough, diaphoresis or chest pains.  nad  Past Medical History  Diagnosis Date  . Hypertension     Past Surgical History  Procedure Date  . Knee surgery     History reviewed. No pertinent family history.  History  Substance Use Topics  . Smoking status: Former Smoker    Types: Cigarettes  . Smokeless tobacco: Not on file  . Alcohol Use: Yes     Comment: occasionally      Review of Systems  Review of Systems  Gen: no weight loss, fevers, chills, night sweats  Neck: no neck pain  Lungs:No wheezing, coughing or hemoptysis, + mild SOB CV: no chest pain, palpitations, dependent edema or orthopnea  Abd: no abdominal pain, nausea, vomiting  GU: no dysuria or gross hematuria  MSK:  No abnormalities  Neuro: no headache, no focal neurologic deficits  Skin: no abnormalities Psyche: negative.   Allergies  Lisinopril  Home Medications  No current outpatient prescriptions on file.  BP 180/100  Pulse 80  Temp 98.3 F (36.8 C) (Oral)  Resp 18  SpO2 100%  Physical Exam  Nursing note and vitals reviewed. Constitutional: He appears well-developed and well-nourished. No distress.  HENT:  Head: Normocephalic and atraumatic.  Eyes:  Pupils are equal, round, and reactive to light.  Neck: Normal range of motion. Neck supple.  Cardiovascular: Normal rate, regular rhythm and normal heart sounds.  Exam reveals no friction rub.   No murmur heard. Pulmonary/Chest: Effort normal. No respiratory distress. He has no wheezes. He has no rales.  Abdominal: Soft.  Neurological: He is alert.  Skin: Skin is warm and dry.    ED Course  Procedures (including critical care time)  Labs Reviewed  CBC WITH DIFFERENTIAL - Abnormal; Notable for the following:    Monocytes Absolute 1.1 (*)     All other components within normal limits  COMPREHENSIVE METABOLIC PANEL - Abnormal; Notable for the following:    Sodium 134 (*)     All other components within normal limits  POCT I-STAT TROPONIN I - Abnormal; Notable for the following:    Troponin i, poc 0.14 (*)     All other components within normal limits  POCT I-STAT TROPONIN I - Abnormal; Notable for the following:    Troponin i, poc 0.11 (*)     All other components within normal limits  TROPONIN I   Dg Chest 2 View  04/17/2012  *RADIOLOGY REPORT*  Clinical Data: Hypertension with dizziness  CHEST - 2 VIEW  Comparison: May 11, 2011  Findings: Lungs clear.  Heart size and pulmonary vascularity are normal.  No adenopathy.  No bone lesions.  IMPRESSION: Lungs are clear.   Original Report Authenticated By: Bretta Bang, M.D.  Ct Angio Chest Pe W/cm &/or Wo Cm  04/17/2012  *RADIOLOGY REPORT*  Clinical Data: Shortness of breath.  CT ANGIOGRAPHY CHEST  Technique:  Multidetector CT imaging of the chest using the standard protocol during bolus administration of intravenous contrast. Multiplanar reconstructed images including MIPs were obtained and reviewed to evaluate the vascular anatomy.  Contrast: OMNIPAQUE IOHEXOL 350 MG/ML SOLN  Comparison: PA and lateral chest earlier this same day and CT chest 02/27/2011.  Findings: No pulmonary embolus is identified.  Heart size is mildly  enlarged.  No pleural or pericardial effusion.  No axillary, hilar or mediastinal lymphadenopathy is present.  The thoracic aorta is unremarkable.  The lungs are clear.  Incidentally imaged upper abdomen is unremarkable.  No focal bony abnormality is identified.  IMPRESSION: Negative for pulmonary embolus or acute abnormality.  Mild cardiomegaly.   Original Report Authenticated By: Holley Dexter, M.D.      No diagnosis found.  Dx: hypertensive urgency  MDM  Pt given 20mg  IV Labetalol for his BP in which is decreased to 173/11. The patients Troponin came back at 0.14 on the POC troponin. After discussion with Dr. Freida Busman a CT chest of the chest ordered to r/o dissection or other pathology of SOB and dscomfort w/hypertension. The CT of the chest is negative. A second point of care Trop ordered and it came back lowe.r 0.11.  12:50pm I spoke with [facility]. He says if the patient is not having any pain or EKG changes and a Full regular (non POC) Troponin is negative then he would send the patient home and aggressively treat BP. If it is a positive Trop, will call [place] back and he will consult on patient.   1:16pm- The patients full troponin has come back negative. Will give blood pressure medications Norvasc and HCTZ. Pt needs to follow-up with family doctor  Pt has been advised of the symptoms that warrant their return to the ED. Patient has voiced understanding and has agreed to follow-up with the PCP or specialist.   Dorthula Matas, PA 04/17/12 1318

## 2012-04-17 NOTE — ED Notes (Signed)
Results shown to Dr. Freida Busman, troponin

## 2012-04-17 NOTE — ED Notes (Signed)
Transported to CT 

## 2012-04-17 NOTE — ED Notes (Signed)
Pt sts he had a bp of 180/130 last night.  Pt sts he has not had is blood pressure meds in about 8 months.  Pt sts his previous bp meds were making him sick.

## 2012-04-20 NOTE — ED Provider Notes (Signed)
Medical screening examination/treatment/procedure(s) were performed by non-physician practitioner and as supervising physician I was immediately available for consultation/collaboration.  Talibah Colasurdo T Eilee Schader, MD 04/20/12 0721 

## 2012-05-16 ENCOUNTER — Encounter: Payer: Self-pay | Admitting: *Deleted

## 2012-05-16 ENCOUNTER — Encounter: Payer: Self-pay | Admitting: Cardiovascular Disease

## 2012-05-16 DIAGNOSIS — I10 Essential (primary) hypertension: Secondary | ICD-10-CM | POA: Insufficient documentation

## 2012-05-20 ENCOUNTER — Encounter: Payer: 59 | Admitting: Cardiovascular Disease

## 2012-05-27 ENCOUNTER — Emergency Department (INDEPENDENT_AMBULATORY_CARE_PROVIDER_SITE_OTHER)
Admission: EM | Admit: 2012-05-27 | Discharge: 2012-05-27 | Disposition: A | Discharge status: Home / Self Care | Payer: Self-pay | Source: Home / Self Care | Attending: Emergency Medicine | Admitting: Emergency Medicine

## 2012-05-27 ENCOUNTER — Encounter (HOSPITAL_COMMUNITY): Payer: Self-pay | Admitting: *Deleted

## 2012-05-27 DIAGNOSIS — I10 Essential (primary) hypertension: Secondary | ICD-10-CM

## 2012-05-27 MED ORDER — HYDROCHLOROTHIAZIDE 25 MG PO TABS: 25.0000 mg | ORAL_TABLET | Freq: Every day | ORAL | Status: DC

## 2012-05-27 MED ORDER — AMLODIPINE BESYLATE 10 MG PO TABS: 10.0000 mg | ORAL_TABLET | Freq: Every day | ORAL | Status: DC

## 2012-05-27 NOTE — ED Provider Notes (Signed)
Medical screening examination/treatment/procedure(s) were performed by non-physician practitioner and as supervising physician I was immediately available for consultation/collaboration.  Leslee Home, M.D.   Reuben Likes, MD 05/27/12 [phone]

## 2012-05-27 NOTE — ED Notes (Signed)
Pt  Reports      He   Took  His  Last  bp  Med  Today  He  Needs  Refill of    hctz  25  Mg    Daily  And    amlodapine    10  Mg    Daily

## 2012-05-27 NOTE — ED Provider Notes (Signed)
History     CSN: [id number]  Arrival date & time 05/27/12  1042   First MD Initiated Contact with Patient 05/27/12 1109      Chief Complaint  Patient presents with  . Medication Refill    (Consider location/radiation/quality/duration/timing/severity/associated sxs/prior treatment) HPI Comments: 27 year old male with a history of hypertension presents with a request of refills of his medications. Is requesting refills of amlodipine 10 mg and HCTZ 25 mg. He states he has no physician. He also has a negative review of systems with no complaints or problems today. He is in the process of obtaining insurance through his new employer.    Past Medical History  Diagnosis Date  . Hypertension     Past Surgical History  Procedure Date  . Knee surgery     No family history on file.  History  Substance Use Topics  . Smoking status: Former Smoker    Types: Cigarettes  . Smokeless tobacco: Not on file  . Alcohol Use: Yes     Comment: occasionally      Review of Systems  All other systems reviewed and are negative.    Allergies  Lisinopril  Home Medications   Current Outpatient Rx  Name  Route  Sig  Dispense  Refill  . AMLODIPINE BESYLATE 10 MG PO TABS   Oral   Take 1 tablet (10 mg total) by mouth daily.   30 tablet   0   . AMLODIPINE BESYLATE 10 MG PO TABS   Oral   Take 1 tablet (10 mg total) by mouth daily.   30 tablet   0   . HYDROCHLOROTHIAZIDE 25 MG PO TABS   Oral   Take 1 tablet (25 mg total) by mouth daily.   30 tablet   0   . HYDROCHLOROTHIAZIDE 25 MG PO TABS   Oral   Take 1 tablet (25 mg total) by mouth daily.   30 tablet   0     BP 161/93  Pulse 76  Temp 99 F (37.2 C) (Oral)  Resp 16  SpO2 100%  Physical Exam  Constitutional: He is oriented to person, place, and time. He appears well-developed and well-nourished. No distress.  Eyes: EOM are normal.  Neck: Normal range of motion. Neck supple.  Cardiovascular: Normal rate, regular  rhythm and normal heart sounds.   Pulmonary/Chest: Effort normal and breath sounds normal. No respiratory distress.  Musculoskeletal: He exhibits no edema.  Neurological: He is alert and oriented to person, place, and time.  Skin: Skin is warm and dry.  Psychiatric: He has a normal mood and affect.    ED Course  Procedures (including critical care time)  Labs Reviewed - No data to display No results found.   1. HTN (hypertension)       MDM  Refill amlodipine 10 mg 1 daily #30 Hydrochlorothiazide 25 mg one daily #30 Hypertension information As soon as your insurance is in force must call to obtain a primary care provider. May call  [phone]        Hayden Rasmussen, NP 05/27/12 1239

## 2012-05-30 ENCOUNTER — Ambulatory Visit (INDEPENDENT_AMBULATORY_CARE_PROVIDER_SITE_OTHER): Payer: Self-pay | Admitting: Cardiovascular Disease

## 2012-05-30 ENCOUNTER — Encounter: Payer: Self-pay | Admitting: Cardiovascular Disease

## 2012-05-30 VITALS — BP 159/102 | HR 97 | Wt 204.0 lb

## 2012-05-30 DIAGNOSIS — J069 Acute upper respiratory infection, unspecified: Secondary | ICD-10-CM

## 2012-05-30 DIAGNOSIS — I10 Essential (primary) hypertension: Secondary | ICD-10-CM

## 2012-05-30 MED ORDER — HYDROCHLOROTHIAZIDE 25 MG PO TABS: 25.0000 mg | ORAL_TABLET | Freq: Every day | ORAL | Status: DC

## 2012-05-30 MED ORDER — AMLODIPINE BESYLATE 10 MG PO TABS: 10.0000 mg | ORAL_TABLET | Freq: Every day | ORAL | Status: DC

## 2012-05-30 NOTE — Assessment & Plan Note (Signed)
Continue current 2 drug Rx F/U with me in 3 months until he has primary.  Consider BMET on return

## 2012-05-30 NOTE — Progress Notes (Signed)
Patient ID: Nathan Nelson, male   DOB: Jul 19, 1985, 27 y.o.   MRN: [id number] 27 yo seen in ER 12/4 and 1/13 with HTN issues.  He has no primary care doctor.  On 12/4 BP was over 224/110  One troponin elevated and second one normal No chest pain BP Rx with iv labatolol  Returned two weeks latter for script for BP meds.  No chest pain dyspnea or palpitations.  Has URI now.  Discussed importance of getting a primary care MD and called over to [facility] family practice to help arrange.  Have called in 1 years worth of BP meds for him.    ROS: Denies fever, malais, weight loss, blurry vision, decreased visual acuity, cough, sputum, SOB, hemoptysis, pleuritic pain, palpitaitons, heartburn, abdominal pain, melena, lower extremity edema, claudication, or rash.  All other systems reviewed and negative   General: Affect appropriate Healthy:  appears stated age HEENT: normal Neck supple with no adenopathy JVP normal no bruits no thyromegaly Lungs clear with no wheezing and good diaphragmatic motion Heart:  S1/S2 no murmur,rub, gallop or click PMI normal Abdomen: benighn, BS positve, no tenderness, no AAA no bruit.  No HSM or HJR Distal pulses intact with no bruits No edema Neuro non-focal Skin warm and dry No muscular weakness  Medications Current Outpatient Prescriptions  Medication Sig Dispense Refill  . amLODipine (NORVASC) 10 MG tablet Take 1 tablet (10 mg total) by mouth daily.  30 tablet  0  . hydrochlorothiazide (HYDRODIURIL) 25 MG tablet Take 1 tablet (25 mg total) by mouth daily.  30 tablet  0  . Naproxen Sodium (ALEVE) 220 MG CAPS Take by mouth as needed.        Allergies Lisinopril  Family History: No family history on file.  Social History: History   Social History  . Marital Status: Legally Separated    Spouse Name: N/A    Number of Children: N/A  . Years of Education: N/A   Occupational History  . Not on file.   Social History Main Topics  . Smoking status: Former  Smoker    Types: Cigarettes  . Smokeless tobacco: Not on file  . Alcohol Use: Yes     Comment: occasionally  . Drug Use: Yes    Special: Marijuana  . Sexually Active:    Other Topics Concern  . Not on file   Social History Narrative  . No narrative on file    Electrocardiogram:  04/19/12  SR 87 no LVH  normal  Assessment and Plan

## 2012-05-30 NOTE — Patient Instructions (Addendum)
Your physician recommends that you continue on your current medications as directed. Please refer to the Current Medication list given to you today.  Your physician recommends that you schedule a follow-up appointment in: 3 months  

## 2012-05-30 NOTE — Assessment & Plan Note (Signed)
Symptomatic OTC care avoid pseudofed with HTN

## 2012-09-03 ENCOUNTER — Ambulatory Visit: Payer: Self-pay | Admitting: Cardiovascular Disease

## 2012-09-11 ENCOUNTER — Encounter: Payer: Self-pay | Admitting: Cardiovascular Disease

## 2012-10-29 ENCOUNTER — Encounter: Payer: Self-pay | Admitting: Cardiovascular Disease

## 2012-12-15 ENCOUNTER — Emergency Department (HOSPITAL_COMMUNITY)
Admission: EM | Admit: 2012-12-15 | Discharge: 2012-12-15 | Disposition: A | Discharge status: Home / Self Care | Payer: BC Managed Care – PPO | Attending: Emergency Medicine | Admitting: Emergency Medicine

## 2012-12-15 ENCOUNTER — Emergency Department (HOSPITAL_COMMUNITY): Payer: BC Managed Care – PPO

## 2012-12-15 ENCOUNTER — Encounter (HOSPITAL_COMMUNITY): Payer: Self-pay | Admitting: *Deleted

## 2012-12-15 DIAGNOSIS — R079 Chest pain, unspecified: Secondary | ICD-10-CM

## 2012-12-15 DIAGNOSIS — R609 Edema, unspecified: Secondary | ICD-10-CM | POA: Insufficient documentation

## 2012-12-15 DIAGNOSIS — I10 Essential (primary) hypertension: Secondary | ICD-10-CM | POA: Insufficient documentation

## 2012-12-15 DIAGNOSIS — R0789 Other chest pain: Secondary | ICD-10-CM | POA: Insufficient documentation

## 2012-12-15 DIAGNOSIS — Z79899 Other long term (current) drug therapy: Secondary | ICD-10-CM | POA: Insufficient documentation

## 2012-12-15 DIAGNOSIS — Z87891 Personal history of nicotine dependence: Secondary | ICD-10-CM | POA: Insufficient documentation

## 2012-12-15 LAB — CBC
HCT: 46.9 % (ref 39.0–52.0)
Hemoglobin: 16.8 g/dL (ref 13.0–17.0)
MCV: 88 fL (ref 78.0–100.0)
Platelets: 430 10*3/uL — ABNORMAL HIGH (ref 150–400)
RBC: 5.33 MIL/uL (ref 4.22–5.81)
WBC: 16 10*3/uL — ABNORMAL HIGH (ref 4.0–10.5)

## 2012-12-15 LAB — BASIC METABOLIC PANEL
CO2: 26 mEq/L (ref 19–32)
Chloride: 99 mEq/L (ref 96–112)
Creatinine, Ser: 1.09 mg/dL (ref 0.50–1.35)

## 2012-12-15 LAB — D-DIMER, QUANTITATIVE: D-Dimer, Quant: <0.27 ug/mL-FEU (ref 0.00–0.48)

## 2012-12-15 LAB — PRO B NATRIURETIC PEPTIDE: Pro B Natriuretic peptide (BNP): 10.5 pg/mL (ref 0–125)

## 2012-12-15 NOTE — ED Notes (Signed)
Old and new EKG given to Dr Rubin Payor

## 2012-12-15 NOTE — ED Provider Notes (Signed)
CSN: [id number]     Arrival date & time 12/15/12  0334 History     First MD Initiated Contact with Patient 12/15/12 0600     Chief Complaint  Patient presents with  . Shortness of Breath   (Consider location/radiation/quality/duration/timing/severity/associated sxs/prior Treatment) HPI Comments: Patient presents with a chief complaint of shortness of breath and intermittent chest pain.  He reports that the chest pain has been occurring intermittently over the past several months.  He reports that the pain typically only last a few seconds and then resolves.  Pain reports that sometimes the pain is located on the right side of his chest and sometimes the pain is located on the left side.   He states that the pain is worse with taking a deep breath.  He is currently not having any pain at this time.  He also reports that he has been feeling short of breath for the past couple of months.  However, he denies any shortness of breath at this time.  He denies any numbness, tingling, dizziness, syncope, nausea, or vomiting associated with the pain.  He states that he has been seen by Urgent Care and was told that his symptoms are related to anxiety.  He also states that he has been seen by Dr. Elease Hashimoto with Cardiology for this and states that he was told at that time that it was related to anxiety.  He states that he has been buying Xanax from a friend to self treat his anxiety.  He denies any prior history of DVT or PE.  Denies any surgeries or prolonged travel in the past 4 weeks.  Denies lower extremity edema.  He does have a history of HTN.  No history of DM or Hyperlipidemia.  No prior cardiac history.  He does not currently smoke.  Denies Cocaine use.  Patient is a 27 y.o. male presenting with shortness of breath. The history is provided by the patient.  Shortness of Breath Associated symptoms: chest pain   Associated symptoms: no abdominal pain, no cough, no diaphoresis, no fever, no hemoptysis, no  syncope, no vomiting and no wheezing     Past Medical History  Diagnosis Date  . Hypertension    Past Surgical History  Procedure Laterality Date  . Knee surgery     No family history on file. History  Substance Use Topics  . Smoking status: Former Smoker    Types: Cigarettes  . Smokeless tobacco: Not on file  . Alcohol Use: Yes     Comment: occasionally    Review of Systems  Constitutional: Negative for fever, chills and diaphoresis.  Respiratory: Positive for shortness of breath. Negative for cough, hemoptysis and wheezing.   Cardiovascular: Positive for chest pain. Negative for palpitations, leg swelling and syncope.  Gastrointestinal: Negative for nausea, vomiting and abdominal pain.  Neurological: Negative for numbness.  All other systems reviewed and are negative.    Allergies  Lisinopril  Home Medications   Current Outpatient Rx  Name  Route  Sig  Dispense  Refill  . amLODipine (NORVASC) 10 MG tablet   Oral   Take 1 tablet (10 mg total) by mouth daily.   30 tablet   11   . hydrochlorothiazide (HYDRODIURIL) 25 MG tablet   Oral   Take 1 tablet (25 mg total) by mouth daily.   30 tablet   11   . Naproxen Sodium (ALEVE) 220 MG CAPS   Oral   Take by mouth as needed.  BP 125/77  Pulse 85  Temp(Src) 98.1 F (36.7 C) (Oral)  Resp 15  SpO2 98% Physical Exam  Nursing note and vitals reviewed. Constitutional: He appears well-developed and well-nourished.  HENT:  Head: Normocephalic and atraumatic.  Neck: Normal range of motion. Neck supple.  Cardiovascular: Normal rate, regular rhythm and normal heart sounds.   Pulses:      Dorsalis pedis pulses are 2+ on the right side, and 2+ on the left side.  Pulmonary/Chest: Effort normal and breath sounds normal. No respiratory distress. He has no wheezes. He has no rales.  Abdominal: Soft. There is no tenderness.  Musculoskeletal: Normal range of motion.  No lower extremity edema bilaterally   Neurological: He is alert.  Skin: Skin is warm and dry.  Psychiatric: He has a normal mood and affect.    ED Course   Procedures (including critical care time)  Labs Reviewed  CBC - Abnormal; Notable for the following:    WBC 16.0 (*)    Platelets 430 (*)    All other components within normal limits  BASIC METABOLIC PANEL - Abnormal; Notable for the following:    Potassium 3.2 (*)    Glucose, Bld 100 (*)    All other components within normal limits  PRO B NATRIURETIC PEPTIDE  D-DIMER, QUANTITATIVE  POCT I-STAT TROPONIN I   Dg Chest 2 View  12/15/2012   *RADIOLOGY REPORT*  Clinical Data: Shortness of breath  CHEST - 2 VIEW  Comparison: Prior CT from 04/17/2012  Findings: Cardiac and mediastinal silhouettes are within normal limits.  The lungs are well inflated.  No consolidation, pleural effusion, or pulmonary edema is identified.  There is no pneumothorax.  No acute osseous abnormalities identified.  IMPRESSION: Normal radiograph the chest with no acute cardiopulmonary process.   Original Report Authenticated By: Rise Mu, M.D.   No diagnosis found.   Date: 12/15/2012  Rate: 110  Rhythm: sinus tachycardia  QRS Axis: normal  Intervals: normal  ST/T Wave abnormalities: normal  Conduction Disutrbances:none  Narrative Interpretation: LVH  Old EKG Reviewed: unchanged    MDM  Patient presents with intermittent chest pain and shortness of breath that has been present for the past 5 months.  He states that he has been told by Dr. Elease Hashimoto with Cardiology and Urgent Care that the pain was anxiety.  No ischemic changes on EKG.  Troponin negative.  D-dimer negative.  CXR negative.  Labs unremarkable.  Lungs CTAB on exam.  Feel that the patient is stable for discharge.  Patient instructed to follow up with PCP.  Return precautions given.  Pascal Lux [place], PA-C 12/15/12 1601

## 2012-12-15 NOTE — ED Notes (Signed)
Pt states sometimes hurts to take deep breath

## 2012-12-15 NOTE — ED Notes (Signed)
Pt d/c'd from monitor, continuous pulse oximetry and blood pressure cuff; pt getting dressed to be discharged home; family at bedside 

## 2012-12-15 NOTE — ED Provider Notes (Signed)
Medical screening examination/treatment/procedure(s) were performed by non-physician practitioner and as supervising physician I was immediately available for consultation/collaboration.  Juliet Rude. Rubin Payor, MD 12/15/12 2234

## 2012-12-15 NOTE — ED Notes (Signed)
Pt states Back pain that radiates to chest, with SOB. Denies Diaphoresis, Nausea, vomiting. Pt able to speak in full sentences. Thinks maybe its his anxiety.

## 2013-03-19 ENCOUNTER — Other Ambulatory Visit: Payer: Self-pay | Admitting: Family Medicine

## 2013-03-19 ENCOUNTER — Ambulatory Visit
Admission: RE | Admit: 2013-03-19 | Discharge: 2013-03-19 | Disposition: A | Discharge status: Home / Self Care | Payer: BC Managed Care – PPO | Source: Ambulatory Visit | Attending: Family Medicine | Admitting: Family Medicine

## 2013-03-19 DIAGNOSIS — R079 Chest pain, unspecified: Secondary | ICD-10-CM

## 2013-06-20 ENCOUNTER — Other Ambulatory Visit: Payer: Self-pay

## 2013-06-20 MED ORDER — HYDROCHLOROTHIAZIDE 25 MG PO TABS: 25.0000 mg | ORAL_TABLET | Freq: Every day | ORAL | Status: DC

## 2013-06-27 ENCOUNTER — Emergency Department (HOSPITAL_BASED_OUTPATIENT_CLINIC_OR_DEPARTMENT_OTHER): Payer: Worker's Compensation

## 2013-06-27 ENCOUNTER — Other Ambulatory Visit: Payer: Self-pay

## 2013-06-27 ENCOUNTER — Encounter (HOSPITAL_BASED_OUTPATIENT_CLINIC_OR_DEPARTMENT_OTHER): Payer: Self-pay | Admitting: Emergency Medicine

## 2013-06-27 ENCOUNTER — Emergency Department (HOSPITAL_BASED_OUTPATIENT_CLINIC_OR_DEPARTMENT_OTHER)
Admission: EM | Admit: 2013-06-27 | Discharge: 2013-06-27 | Disposition: A | Discharge status: Home / Self Care | Payer: Worker's Compensation | Attending: Emergency Medicine | Admitting: Emergency Medicine

## 2013-06-27 DIAGNOSIS — Y9389 Activity, other specified: Secondary | ICD-10-CM | POA: Insufficient documentation

## 2013-06-27 DIAGNOSIS — Y990 Civilian activity done for income or pay: Secondary | ICD-10-CM | POA: Insufficient documentation

## 2013-06-27 DIAGNOSIS — S9780XA Crushing injury of unspecified foot, initial encounter: Secondary | ICD-10-CM | POA: Insufficient documentation

## 2013-06-27 DIAGNOSIS — W230XXA Caught, crushed, jammed, or pinched between moving objects, initial encounter: Secondary | ICD-10-CM | POA: Insufficient documentation

## 2013-06-27 DIAGNOSIS — Z79899 Other long term (current) drug therapy: Secondary | ICD-10-CM | POA: Insufficient documentation

## 2013-06-27 DIAGNOSIS — I10 Essential (primary) hypertension: Secondary | ICD-10-CM | POA: Insufficient documentation

## 2013-06-27 DIAGNOSIS — Y9289 Other specified places as the place of occurrence of the external cause: Secondary | ICD-10-CM | POA: Insufficient documentation

## 2013-06-27 DIAGNOSIS — S9781XA Crushing injury of right foot, initial encounter: Secondary | ICD-10-CM

## 2013-06-27 DIAGNOSIS — Z87891 Personal history of nicotine dependence: Secondary | ICD-10-CM | POA: Insufficient documentation

## 2013-06-27 MED ORDER — AMLODIPINE BESYLATE 10 MG PO TABS: 10.0000 mg | ORAL_TABLET | Freq: Every day | ORAL | Status: DC

## 2013-06-27 MED ORDER — NAPROXEN 250 MG PO TABS
500.0000 mg | ORAL_TABLET | Freq: Once | ORAL | Status: AC
  Administered 2013-06-27: 500 mg via ORAL
  Filled 2013-06-27: qty 2

## 2013-06-27 MED ORDER — NAPROXEN SODIUM 550 MG PO TABS: ORAL_TABLET | ORAL | Status: DC

## 2013-06-27 NOTE — ED Provider Notes (Signed)
CSN: [id number]     Arrival date & time 06/27/13  0547 History   First MD Initiated Contact with Patient 06/27/13 [phone]     Chief Complaint  Patient presents with  . Foot Injury     (Consider location/radiation/quality/duration/timing/severity/associated sxs/prior Treatment) HPI This is a 28 year old male who had a palate run over his right foot at work just prior to arrival. He is having moderate pain with associated swelling on the lateral aspect of the right foot. There is no numbness or functional deficit distally. Pain is worse with palpation or weightbearing. He is able to bear weight. He denies other injury.  Past Medical History  Diagnosis Date  . Hypertension    Past Surgical History  Procedure Laterality Date  . Knee surgery     No family history on file. History  Substance Use Topics  . Smoking status: Former Smoker    Types: Cigarettes  . Smokeless tobacco: Not on file  . Alcohol Use: Yes     Comment: occasionally    Review of Systems  All other systems reviewed and are negative.   Allergies  Lisinopril  Home Medications   Current Outpatient Rx  Name  Route  Sig  Dispense  Refill  . amLODipine (NORVASC) 10 MG tablet   Oral   Take 1 tablet (10 mg total) by mouth daily.   30 tablet   11   . hydrochlorothiazide (HYDRODIURIL) 25 MG tablet   Oral   Take 1 tablet (25 mg total) by mouth daily.   30 tablet   1     .Marland Kitchen.Patient needs to contact office to schedule  App ...   . Multiple Vitamins-Minerals (MULTIVITAMIN GUMMIES ADULT PO)   Oral   Take 1 tablet by mouth daily.         . Naproxen Sodium (ALEVE) 220 MG CAPS   Oral   Take 220 mg by mouth daily.           BP 156/96  Pulse 102  Temp(Src) 98.7 F (37.1 C) (Oral)  Resp 16  Ht 5\' 6"  (1.676 m)  Wt 200 lb (90.719 kg)  BMI 32.30 kg/m2  SpO2 100%  Physical Exam General: Well-developed, well-nourished male in no acute distress; appearance consistent with age of record HENT: normocephalic;  atraumatic Eyes: Normal appearance Neck: supple Heart: regular rate and rhythm Lungs: Normal respiratory effort and excursion Abdomen: soft; nondistended Extremities: No deformity; full range of motion; pulses normal; tenderness and swelling over the base of the right fifth metatarsal; right foot distally neurovascularly intact with intact tendon function Neurologic: Awake, alert and oriented; motor function intact in all extremities and symmetric; no facial droop Skin: Warm and dry Psychiatric: Normal mood and affect    ED Course  Procedures (including critical care time)    MDM  Nursing notes and vitals signs, including pulse oximetry, reviewed.  Summary of this visit's results, reviewed by myself:  Imaging Studies: Dg Foot Complete Right  06/27/2013   CLINICAL DATA:  Trauma.  Right lateral foot pain.  Foot injury.  EXAM: RIGHT FOOT COMPLETE - 3+ VIEW  COMPARISON:  None.  FINDINGS: Anatomic alignment of the bones of the right foot. There is no fracture identified midfoot and hindfoot joints appear within normal limits. Metatarsals intact. Soft tissues are within normal limits.  IMPRESSION: Negative.   Electronically Signed   By: Andreas NewportGeoffrey  Lamke M.D.   On: 06/27/2013 06:20       Hanley SeamenJohn L Aviv Rota, MD 06/27/13 [phone]

## 2013-06-27 NOTE — ED Notes (Signed)
Reports having a pallet "scarpe" across the top of right foot.

## 2013-07-03 ENCOUNTER — Emergency Department (HOSPITAL_COMMUNITY): Payer: Managed Care, Other (non HMO)

## 2013-07-03 ENCOUNTER — Emergency Department (HOSPITAL_COMMUNITY)
Admission: EM | Admit: 2013-07-03 | Discharge: 2013-07-03 | Disposition: A | Discharge status: Home / Self Care | Payer: Managed Care, Other (non HMO) | Attending: Emergency Medicine | Admitting: Emergency Medicine

## 2013-07-03 ENCOUNTER — Encounter (HOSPITAL_COMMUNITY): Payer: Self-pay | Admitting: Emergency Medicine

## 2013-07-03 DIAGNOSIS — R079 Chest pain, unspecified: Secondary | ICD-10-CM | POA: Insufficient documentation

## 2013-07-03 DIAGNOSIS — Z79899 Other long term (current) drug therapy: Secondary | ICD-10-CM | POA: Insufficient documentation

## 2013-07-03 DIAGNOSIS — I10 Essential (primary) hypertension: Secondary | ICD-10-CM | POA: Insufficient documentation

## 2013-07-03 DIAGNOSIS — F17200 Nicotine dependence, unspecified, uncomplicated: Secondary | ICD-10-CM | POA: Insufficient documentation

## 2013-07-03 DIAGNOSIS — Z0000 Encounter for general adult medical examination without abnormal findings: Secondary | ICD-10-CM

## 2013-07-03 MED ORDER — NAPROXEN 375 MG PO TABS: 375.0000 mg | ORAL_TABLET | Freq: Two times a day (BID) | ORAL | Status: DC

## 2013-07-03 MED ORDER — IBUPROFEN 800 MG PO TABS
800.0000 mg | ORAL_TABLET | Freq: Once | ORAL | Status: AC
  Administered 2013-07-03: 800 mg via ORAL
  Filled 2013-07-03: qty 1
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
## 2013-07-03 NOTE — ED Notes (Signed)
Pt. reports painful lump at right lower chest for 1 1/2 weeks , denies injury , pain when palpated and certain positions /movement. Respirations unlabored . No nausea or diaphoresis .

## 2013-07-12 ENCOUNTER — Encounter (HOSPITAL_BASED_OUTPATIENT_CLINIC_OR_DEPARTMENT_OTHER): Payer: Self-pay | Admitting: Emergency Medicine

## 2013-07-12 ENCOUNTER — Emergency Department (HOSPITAL_BASED_OUTPATIENT_CLINIC_OR_DEPARTMENT_OTHER)
Admission: EM | Admit: 2013-07-12 | Discharge: 2013-07-12 | Disposition: A | Discharge status: Home / Self Care | Payer: Managed Care, Other (non HMO) | Attending: Emergency Medicine | Admitting: Emergency Medicine

## 2013-07-12 ENCOUNTER — Emergency Department (HOSPITAL_BASED_OUTPATIENT_CLINIC_OR_DEPARTMENT_OTHER): Payer: Managed Care, Other (non HMO)

## 2013-07-12 DIAGNOSIS — Z79899 Other long term (current) drug therapy: Secondary | ICD-10-CM | POA: Insufficient documentation

## 2013-07-12 DIAGNOSIS — F17200 Nicotine dependence, unspecified, uncomplicated: Secondary | ICD-10-CM | POA: Insufficient documentation

## 2013-07-12 DIAGNOSIS — Z87828 Personal history of other (healed) physical injury and trauma: Secondary | ICD-10-CM | POA: Insufficient documentation

## 2013-07-12 DIAGNOSIS — M779 Enthesopathy, unspecified: Secondary | ICD-10-CM

## 2013-07-12 DIAGNOSIS — M6580 Other synovitis and tenosynovitis, unspecified site: Secondary | ICD-10-CM | POA: Insufficient documentation

## 2013-07-12 DIAGNOSIS — I10 Essential (primary) hypertension: Secondary | ICD-10-CM | POA: Insufficient documentation

## 2013-07-12 DIAGNOSIS — Z9889 Other specified postprocedural states: Secondary | ICD-10-CM | POA: Insufficient documentation

## 2013-07-12 HISTORY — DX: Anxiety disorder, unspecified: F41.9

## 2013-07-12 MED ORDER — IBUPROFEN 800 MG PO TABS: 800.0000 mg | ORAL_TABLET | Freq: Three times a day (TID) | ORAL | Status: DC

## 2013-07-12 NOTE — ED Notes (Signed)
Pt c/o left knee pain x 2 days.  History of surgery 2 years ago for same knee.  No known injury.

## 2013-07-12 NOTE — Discharge Instructions (Signed)
Tendinitis °Tendinitis is swelling and inflammation of the tendons. Tendons are band-like tissues that connect muscle to bone. Tendinitis commonly occurs in the:  °· Shoulders (rotator cuff). °· Heels (Achilles tendon). °· Elbows (triceps tendon). °CAUSES °Tendinitis is usually caused by overusing the tendon, muscles, and joints involved. When the tissue surrounding a tendon (synovium) becomes inflamed, it is called tenosynovitis. Tendinitis commonly develops in people whose jobs require repetitive motions. °SYMPTOMS °· Pain. °· Tenderness. °· Mild swelling. °DIAGNOSIS °Tendinitis is usually diagnosed by physical exam. Your caregiver may also order X-rays or other imaging tests. °TREATMENT °Your caregiver may recommend certain medicines or exercises for your treatment. °HOME CARE INSTRUCTIONS  °· Use a sling or splint for as long as directed by your caregiver until the pain decreases. °· Put ice on the injured area. °· Put ice in a plastic bag. °· Place a towel between your skin and the bag. °· Leave the ice on for 15-20 minutes, 03-04 times a day. °· Avoid using the limb while the tendon is painful. Perform gentle range of motion exercises only as directed by your caregiver. Stop exercises if pain or discomfort increase, unless directed otherwise by your caregiver. °· Only take over-the-counter or prescription medicines for pain, discomfort, or fever as directed by your caregiver. °SEEK MEDICAL CARE IF:  °· Your pain and swelling increase. °· You develop new, unexplained symptoms, especially increased numbness in the hands. °MAKE SURE YOU:  °· Understand these instructions. °· Will watch your condition. °· Will get help right away if you are not doing well or get worse. °Document Released: 04/28/2000 Document Revised: 07/24/2011 Document Reviewed: 07/18/2010 °[employer]® Patient Information ©2014 [employer]. ° °
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
## 2013-07-19 ENCOUNTER — Emergency Department (HOSPITAL_BASED_OUTPATIENT_CLINIC_OR_DEPARTMENT_OTHER)
Admission: EM | Admit: 2013-07-19 | Discharge: 2013-07-19 | Disposition: A | Discharge status: Home / Self Care | Payer: Managed Care, Other (non HMO) | Attending: Emergency Medicine | Admitting: Emergency Medicine

## 2013-07-19 ENCOUNTER — Emergency Department (HOSPITAL_BASED_OUTPATIENT_CLINIC_OR_DEPARTMENT_OTHER): Payer: Managed Care, Other (non HMO)

## 2013-07-19 ENCOUNTER — Encounter (HOSPITAL_BASED_OUTPATIENT_CLINIC_OR_DEPARTMENT_OTHER): Payer: Self-pay | Admitting: Emergency Medicine

## 2013-07-19 DIAGNOSIS — Z79899 Other long term (current) drug therapy: Secondary | ICD-10-CM | POA: Insufficient documentation

## 2013-07-19 DIAGNOSIS — F17200 Nicotine dependence, unspecified, uncomplicated: Secondary | ICD-10-CM | POA: Insufficient documentation

## 2013-07-19 DIAGNOSIS — I10 Essential (primary) hypertension: Secondary | ICD-10-CM | POA: Insufficient documentation

## 2013-07-19 DIAGNOSIS — R0789 Other chest pain: Secondary | ICD-10-CM | POA: Insufficient documentation

## 2013-07-19 DIAGNOSIS — F411 Generalized anxiety disorder: Secondary | ICD-10-CM | POA: Insufficient documentation

## 2013-07-19 LAB — CBC WITH DIFFERENTIAL/PLATELET
BASOS ABS: 0.1 10*3/uL (ref 0.0–0.1)
BASOS PCT: 0 % (ref 0–1)
Eosinophils Absolute: 0.2 10*3/uL (ref 0.0–0.7)
Eosinophils Relative: 1 % (ref 0–5)
HEMATOCRIT: 43.5 % (ref 39.0–52.0)
Hemoglobin: 14.5 g/dL (ref 13.0–17.0)
LYMPHS PCT: 32 % (ref 12–46)
Lymphs Abs: 3.9 10*3/uL (ref 0.7–4.0)
MCH: 29.8 pg (ref 26.0–34.0)
MCHC: 33.3 g/dL (ref 30.0–36.0)
MCV: 89.3 fL (ref 78.0–100.0)
Monocytes Absolute: 1.6 10*3/uL — ABNORMAL HIGH (ref 0.1–1.0)
Monocytes Relative: 13 % — ABNORMAL HIGH (ref 3–12)
NEUTROS ABS: 6.5 10*3/uL (ref 1.7–7.7)
NEUTROS PCT: 53 % (ref 43–77)
Platelets: 361 10*3/uL (ref 150–400)
RBC: 4.87 MIL/uL (ref 4.22–5.81)
RDW: 13.6 % (ref 11.5–15.5)
WBC: 12.3 10*3/uL — AB (ref 4.0–10.5)

## 2013-07-19 LAB — BASIC METABOLIC PANEL
BUN: 17 mg/dL (ref 6–23)
CO2: 30 mEq/L (ref 19–32)
CREATININE: 1.2 mg/dL (ref 0.50–1.35)
Calcium: 9.9 mg/dL (ref 8.4–10.5)
Chloride: 97 mEq/L (ref 96–112)
GFR, EST NON AFRICAN AMERICAN: 81 mL/min — AB (ref >90)
Glucose, Bld: 83 mg/dL (ref 70–99)
Potassium: 4.1 mEq/L (ref 3.7–5.3)
Sodium: 138 mEq/L (ref 137–147)

## 2013-07-19 LAB — TROPONIN I

## 2013-07-19 LAB — D-DIMER, QUANTITATIVE (NOT AT ARMC)

## 2013-07-19 MED ORDER — ASPIRIN 81 MG PO CHEW
324.0000 mg | CHEWABLE_TABLET | Freq: Once | ORAL | Status: AC
  Administered 2013-07-19: 324 mg via ORAL
  Filled 2013-07-19: qty 4

## 2013-07-19 NOTE — ED Notes (Addendum)
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 Pt states he was taking Celexa 20mg  daily and stopped it one week ago because he "ran out." Denies being gradually taken off of med.

## 2013-07-19 NOTE — ED Notes (Signed)
Report to Bobbie, RN

## 2013-07-19 NOTE — Discharge Instructions (Signed)
Chest Pain (Nonspecific) °It is often hard to give a specific diagnosis for the cause of chest pain. There is always a chance that your pain could be related to something serious, such as a heart attack or a blood clot in the lungs. You need to follow up with your caregiver for further evaluation. °CAUSES  °· Heartburn. °· Pneumonia or bronchitis. °· Anxiety or stress. °· Inflammation around your heart (pericarditis) or lung (pleuritis or pleurisy). °· A blood clot in the lung. °· A collapsed lung (pneumothorax). It can develop suddenly on its own (spontaneous pneumothorax) or from injury (trauma) to the chest. °· Shingles infection (herpes zoster virus). °The chest wall is composed of bones, muscles, and cartilage. Any of these can be the source of the pain. °· The bones can be bruised by injury. °· The muscles or cartilage can be strained by coughing or overwork. °· The cartilage can be affected by inflammation and become sore (costochondritis). °DIAGNOSIS  °Lab tests or other studies, such as X-rays, electrocardiography, stress testing, or cardiac imaging, may be needed to find the cause of your pain.  °TREATMENT  °· Treatment depends on what may be causing your chest pain. Treatment may include: °· Acid blockers for heartburn. °· Anti-inflammatory medicine. °· Pain medicine for inflammatory conditions. °· Antibiotics if an infection is present. °· You may be advised to change lifestyle habits. This includes stopping smoking and avoiding alcohol, caffeine, and chocolate. °· You may be advised to keep your head raised (elevated) when sleeping. This reduces the chance of acid going backward from your stomach into your esophagus. °· Most of the time, nonspecific chest pain will improve within 2 to 3 days with rest and mild pain medicine. °HOME CARE INSTRUCTIONS  °· If antibiotics were prescribed, take your antibiotics as directed. Finish them even if you start to feel better. °· For the next few days, avoid physical  activities that bring on chest pain. Continue physical activities as directed. °· Do not smoke. °· Avoid drinking alcohol. °· Only take over-the-counter or prescription medicine for pain, discomfort, or fever as directed by your caregiver. °· Follow your caregiver's suggestions for further testing if your chest pain does not go away. °· Keep any follow-up appointments you made. If you do not go to an appointment, you could develop lasting (chronic) problems with pain. If there is any problem keeping an appointment, you must call to reschedule. °SEEK MEDICAL CARE IF:  °· You think you are having problems from the medicine you are taking. Read your medicine instructions carefully. °· Your chest pain does not go away, even after treatment. °· You develop a rash with blisters on your chest. °SEEK IMMEDIATE MEDICAL CARE IF:  °· You have increased chest pain or pain that spreads to your arm, neck, jaw, back, or abdomen. °· You develop shortness of breath, an increasing cough, or you are coughing up blood. °· You have severe back or abdominal pain, feel nauseous, or vomit. °· You develop severe weakness, fainting, or chills. °· You have a fever. °THIS IS AN EMERGENCY. Do not wait to see if the pain will go away. Get medical help at once. Call your local emergency services (911 in [place] Do not drive yourself to the hospital. °MAKE SURE YOU:  °· Understand these instructions. °· Will watch your condition. °· Will get help right away if you are not doing well or get worse. °Document Released: 02/08/2005 Document Revised: 07/24/2011 Document Reviewed: 12/05/2007 °ExitCare® Patient Information ©2014 [employer]. ° °

## 2013-07-19 NOTE — ED Provider Notes (Signed)
CSN: [id number]     Arrival date & time 07/19/13  0459 History   First MD Initiated Contact with Patient 07/19/13 [phone]     Chief Complaint  Patient presents with  . Chest Pain     (Consider location/radiation/quality/duration/timing/severity/associated sxs/prior Treatment) HPI This is a 28 year old male with a history of hypertension. He is here with chest pain that began while he was working about 3 AM this morning. The pain is located to the left of this sternum and is well localized. He describes as feeling like an ache. It was moderate in severity at its worst. It was worse with activity or movement but not improve with rest. He denies shortness of breath but states it felt difficult to take a deep breath. He denies diaphoresis or nausea.  He has been worked up for atypical chest pain in the past and was told by Dr. Eden EmmsNishan that this was likely due to anxiety.  Past Medical History  Diagnosis Date  . Hypertension   . Anxiety    Past Surgical History  Procedure Laterality Date  . Knee surgery     History reviewed. No pertinent family history. History  Substance Use Topics  . Smoking status: Current Every Day Smoker    Types: Cigarettes  . Smokeless tobacco: Not on file  . Alcohol Use: Yes     Comment: occasionally    Review of Systems  All other systems reviewed and are negative.    Allergies  Lisinopril  Home Medications   Current Outpatient Rx  Name  Route  Sig  Dispense  Refill  . amLODipine (NORVASC) 10 MG tablet   Oral   Take 1 tablet (10 mg total) by mouth daily.   30 tablet   1     .Marland Kitchen.Patient needs to contact office to schedule  App ...   . hydrochlorothiazide (HYDRODIURIL) 25 MG tablet   Oral   Take 1 tablet (25 mg total) by mouth daily.   30 tablet   1     .Marland Kitchen.Patient needs to contact office to schedule  App ...   . ibuprofen (ADVIL,MOTRIN) 800 MG tablet   Oral   Take 1 tablet (800 mg total) by mouth 3 (three) times daily.   21 tablet   0   .  LORazepam (ATIVAN) 0.5 MG tablet   Oral   Take 0.5 mg by mouth every 6 (six) hours as needed for anxiety.         . citalopram (CELEXA) 20 MG tablet   Oral   Take 20 mg by mouth daily.          BP 127/77  Pulse 69  Temp(Src) 98.9 F (37.2 C) (Oral)  Resp 18  Ht 5\' 6"  (1.676 m)  Wt 200 lb (90.719 kg)  BMI 32.30 kg/m2  SpO2 98%  Physical Exam General: Well-developed, well-nourished male in no acute distress; appearance consistent with age of record HENT: normocephalic; atraumatic Eyes: pupils equal, round and reactive to light; extraocular muscles intact Neck: supple Heart: regular rate and rhythm; no murmurs, rubs or gallops Lungs: clear to auscultation bilaterally Chest: Nontender Abdomen: soft; nondistended; nontender; no masses or hepatosplenomegaly; bowel sounds present Extremities: No deformity; full range of motion; pulses normal Neurologic: Awake, alert and oriented; motor function intact in all extremities and symmetric; no facial droop Skin: Warm and dry Psychiatric: Normal mood and affect    ED Course  Procedures (including critical care time)   EKG Interpretation   Date/Time:  Saturday  July 19 2013 05:07:39 EST Ventricular Rate:  85 PR Interval:  152 QRS Duration: 86 QT Interval:  352 QTC Calculation: 418 R Axis:   87 Text Interpretation:  Normal sinus rhythm Normal ECG Rate is slower  Confirmed by Jakaree Pickard  MD, Jonny Ruiz ([id number]) on 07/19/2013 5:12:33 AM      MDM   Nursing notes and vitals signs, including pulse oximetry, reviewed.  Summary of this visit's results, reviewed by myself:  Labs:  Results for orders placed during the hospital encounter of 07/19/13 (from the past 24 hour(s))  D-DIMER, QUANTITATIVE     Status: None   Collection Time    07/19/13  5:20 AM      Result Value Ref Range   D-Dimer, Quant <0.27  0.00 - 0.48 ug/mL-FEU  TROPONIN I     Status: None   Collection Time    07/19/13  5:20 AM      Result Value Ref Range   Troponin  I <0.30  <0.30 ng/mL  BASIC METABOLIC PANEL     Status: Abnormal   Collection Time    07/19/13  5:20 AM      Result Value Ref Range   Sodium 138  137 - 147 mEq/L   Potassium 4.1  3.7 - 5.3 mEq/L   Chloride 97  96 - 112 mEq/L   CO2 30  19 - 32 mEq/L   Glucose, Bld 83  70 - 99 mg/dL   BUN 17  6 - 23 mg/dL   Creatinine, Ser 8.46  0.50 - 1.35 mg/dL   Calcium 9.9  8.4 - 96.2 mg/dL   GFR calc non Af Amer 81 (*) >90 mL/min   GFR calc Af Amer >90  >90 mL/min  CBC WITH DIFFERENTIAL     Status: Abnormal   Collection Time    07/19/13  5:20 AM      Result Value Ref Range   WBC 12.3 (*) 4.0 - 10.5 K/uL   RBC 4.87  4.22 - 5.81 MIL/uL   Hemoglobin 14.5  13.0 - 17.0 g/dL   HCT 95.2  84.1 - 32.4 %   MCV 89.3  78.0 - 100.0 fL   MCH 29.8  26.0 - 34.0 pg   MCHC 33.3  30.0 - 36.0 g/dL   RDW 40.1  02.7 - 25.3 %   Platelets 361  150 - 400 K/uL   Neutrophils Relative % 53  43 - 77 %   Neutro Abs 6.5  1.7 - 7.7 K/uL   Lymphocytes Relative 32  12 - 46 %   Lymphs Abs 3.9  0.7 - 4.0 K/uL   Monocytes Relative 13 (*) 3 - 12 %   Monocytes Absolute 1.6 (*) 0.1 - 1.0 K/uL   Eosinophils Relative 1  0 - 5 %   Eosinophils Absolute 0.2  0.0 - 0.7 K/uL   Basophils Relative 0  0 - 1 %   Basophils Absolute 0.1  0.0 - 0.1 K/uL   CXR appears unchanged from last month. Chest pain is atypical. Will refer back to Dr. Eden Emms.      Hanley Seamen, MD 07/19/13 [phone]

## 2013-07-23 ENCOUNTER — Encounter: Payer: Self-pay | Admitting: Physician Assistant

## 2013-07-24 ENCOUNTER — Encounter: Payer: Self-pay | Admitting: Physician Assistant

## 2013-08-01 ENCOUNTER — Encounter (HOSPITAL_BASED_OUTPATIENT_CLINIC_OR_DEPARTMENT_OTHER): Payer: Self-pay | Admitting: Emergency Medicine

## 2013-08-01 ENCOUNTER — Other Ambulatory Visit: Payer: Self-pay

## 2013-08-01 ENCOUNTER — Emergency Department (HOSPITAL_BASED_OUTPATIENT_CLINIC_OR_DEPARTMENT_OTHER)
Admission: EM | Admit: 2013-08-01 | Discharge: 2013-08-02 | Discharge status: Against Medical Advice | Payer: Managed Care, Other (non HMO) | Attending: Emergency Medicine | Admitting: Emergency Medicine

## 2013-08-01 DIAGNOSIS — M25519 Pain in unspecified shoulder: Secondary | ICD-10-CM | POA: Insufficient documentation

## 2013-08-01 DIAGNOSIS — F17200 Nicotine dependence, unspecified, uncomplicated: Secondary | ICD-10-CM | POA: Insufficient documentation

## 2013-08-01 DIAGNOSIS — I10 Essential (primary) hypertension: Secondary | ICD-10-CM | POA: Insufficient documentation

## 2013-08-01 MED ORDER — AMLODIPINE BESYLATE 10 MG PO TABS: 10.0000 mg | ORAL_TABLET | Freq: Every day | ORAL | Status: DC

## 2013-08-01 NOTE — ED Notes (Signed)
Right shoulder pain x 1 week-denies injury-states pain is worse with movement-denies pain at present

## 2013-08-02 NOTE — ED Notes (Signed)
Pt at desk stating that he has to go pick up son, not willing to wait for md eval.  Ambulatory without difficulty, nad, moving right arm around without any problems.

## 2013-08-09 ENCOUNTER — Emergency Department (HOSPITAL_BASED_OUTPATIENT_CLINIC_OR_DEPARTMENT_OTHER)
Admission: EM | Admit: 2013-08-09 | Discharge: 2013-08-09 | Disposition: A | Discharge status: Home / Self Care | Payer: Managed Care, Other (non HMO) | Attending: Emergency Medicine | Admitting: Emergency Medicine

## 2013-08-09 ENCOUNTER — Encounter (HOSPITAL_BASED_OUTPATIENT_CLINIC_OR_DEPARTMENT_OTHER): Payer: Self-pay | Admitting: Emergency Medicine

## 2013-08-09 DIAGNOSIS — R55 Syncope and collapse: Secondary | ICD-10-CM

## 2013-08-09 DIAGNOSIS — R011 Cardiac murmur, unspecified: Secondary | ICD-10-CM | POA: Insufficient documentation

## 2013-08-09 DIAGNOSIS — I10 Essential (primary) hypertension: Secondary | ICD-10-CM | POA: Insufficient documentation

## 2013-08-09 DIAGNOSIS — Z79899 Other long term (current) drug therapy: Secondary | ICD-10-CM | POA: Insufficient documentation

## 2013-08-09 DIAGNOSIS — R42 Dizziness and giddiness: Secondary | ICD-10-CM | POA: Insufficient documentation

## 2013-08-09 DIAGNOSIS — F17200 Nicotine dependence, unspecified, uncomplicated: Secondary | ICD-10-CM | POA: Insufficient documentation

## 2013-08-09 DIAGNOSIS — F411 Generalized anxiety disorder: Secondary | ICD-10-CM | POA: Insufficient documentation

## 2013-08-09 DIAGNOSIS — R0789 Other chest pain: Secondary | ICD-10-CM

## 2013-08-09 LAB — CBC WITH DIFFERENTIAL/PLATELET
BASOS PCT: 0 % (ref 0–1)
Basophils Absolute: 0 10*3/uL (ref 0.0–0.1)
Eosinophils Absolute: 0.2 10*3/uL (ref 0.0–0.7)
Eosinophils Relative: 2 % (ref 0–5)
HEMATOCRIT: 45.6 % (ref 39.0–52.0)
HEMOGLOBIN: 15.3 g/dL (ref 13.0–17.0)
LYMPHS PCT: 27 % (ref 12–46)
Lymphs Abs: 3.1 10*3/uL (ref 0.7–4.0)
MCH: 30.1 pg (ref 26.0–34.0)
MCHC: 33.6 g/dL (ref 30.0–36.0)
MCV: 89.6 fL (ref 78.0–100.0)
MONO ABS: 1.1 10*3/uL — AB (ref 0.1–1.0)
MONOS PCT: 9 % (ref 3–12)
NEUTROS ABS: 7.1 10*3/uL (ref 1.7–7.7)
NEUTROS PCT: 61 % (ref 43–77)
Platelets: ADEQUATE 10*3/uL (ref 150–400)
RBC: 5.09 MIL/uL (ref 4.22–5.81)
RDW: 13.8 % (ref 11.5–15.5)
WBC: 11.5 10*3/uL — AB (ref 4.0–10.5)

## 2013-08-09 LAB — RAPID URINE DRUG SCREEN, HOSP PERFORMED
Amphetamines: NOT DETECTED
Barbiturates: NOT DETECTED
Benzodiazepines: POSITIVE — AB
Cocaine: NOT DETECTED
OPIATES: NOT DETECTED
Tetrahydrocannabinol: NOT DETECTED

## 2013-08-09 LAB — BASIC METABOLIC PANEL
BUN: 15 mg/dL (ref 6–23)
CALCIUM: 10.1 mg/dL (ref 8.4–10.5)
CO2: 30 meq/L (ref 19–32)
Chloride: 101 mEq/L (ref 96–112)
Creatinine, Ser: 1 mg/dL (ref 0.50–1.35)
GFR calc Af Amer: >90 mL/min (ref >90)
GLUCOSE: 73 mg/dL (ref 70–99)
POTASSIUM: 4 meq/L (ref 3.7–5.3)
Sodium: 140 mEq/L (ref 137–147)

## 2013-08-09 LAB — URINALYSIS, ROUTINE W REFLEX MICROSCOPIC
Bilirubin Urine: NEGATIVE
GLUCOSE, UA: NEGATIVE mg/dL
Hgb urine dipstick: NEGATIVE
KETONES UR: NEGATIVE mg/dL
Leukocytes, UA: NEGATIVE
NITRITE: NEGATIVE
PH: 7 (ref 5.0–8.0)
PROTEIN: NEGATIVE mg/dL
Specific Gravity, Urine: 1.018 (ref 1.005–1.030)
Urobilinogen, UA: 0.2 mg/dL (ref 0.0–1.0)

## 2013-08-09 LAB — TROPONIN I

## 2013-08-09 MED ORDER — IBUPROFEN 400 MG PO TABS
600.0000 mg | ORAL_TABLET | Freq: Once | ORAL | Status: AC
  Administered 2013-08-09: 600 mg via ORAL
  Filled 2013-08-09 (×2): qty 1

## 2013-08-09 NOTE — ED Notes (Signed)
Pt resting easy at this time. NAD noted. Call bell within reach. Awaiting results at this time

## 2013-08-09 NOTE — ED Notes (Signed)
MD at bedside. 

## 2013-08-09 NOTE — ED Notes (Signed)
Pt reports CP that started tonight. Described pain as constant but similar to pain he had here previously.  Pt on phone during assessment.  Rates pain 5/10, described as dull in nature.

## 2013-08-09 NOTE — ED Provider Notes (Signed)
CSN: [id number]     Arrival date & time 08/09/13  [id number] History   First MD Initiated Contact with Patient 08/09/13 0255     Chief Complaint  Patient presents with  . Chest Pain     (Consider location/radiation/quality/duration/timing/severity/associated sxs/prior Treatment) HPI Comments: Pt comes in with cc of chest pain. Hx of HTN, and previous chest pains. Pt has intermittent chest pain, unprovoked. Current pain started at midnight, while at work. He reports the pain as dull pain, midternal, radiates slightly to the left idiopathic thrombocytopenic purpura is not exertional, or pleuritic. No new cough. Denies substance abuse. NO hx of PE, DVT, no risk factors for the same. No new leg swelling.  Pt reports that today, he felt like he might pass out. He had no palpitations, nausea, dib, diaphoresis.  No family hx of premature CAD, or unexplained deaths in young people. No family or personal hx of dysrhythmias.  Patient is a 28 y.o. male presenting with chest pain. The history is provided by the patient.  Chest Pain Associated symptoms: dizziness   Associated symptoms: no abdominal pain, no cough and no shortness of breath     Past Medical History  Diagnosis Date  . Hypertension   . Anxiety    Past Surgical History  Procedure Laterality Date  . Knee surgery     No family history on file. History  Substance Use Topics  . Smoking status: Current Every Day Smoker    Types: Cigarettes  . Smokeless tobacco: Not on file  . Alcohol Use: Yes     Comment: occasionally    Review of Systems  Constitutional: Negative for activity change and appetite change.  Respiratory: Negative for cough and shortness of breath.   Cardiovascular: Positive for chest pain.  Gastrointestinal: Negative for abdominal pain.  Genitourinary: Negative for dysuria.  Neurological: Positive for dizziness and light-headedness. Negative for syncope.  All other systems reviewed and are  negative.      Allergies  Lisinopril  Home Medications   Current Outpatient Rx  Name  Route  Sig  Dispense  Refill  . ALPRAZolam (XANAX PO)   Oral   Take by mouth.         Marland Kitchen. amLODipine (NORVASC) 10 MG tablet   Oral   Take 1 tablet (10 mg total) by mouth daily.   30 tablet   0     .Marland Kitchen.Patient needs to contact office to schedule  App ...   . citalopram (CELEXA) 20 MG tablet   Oral   Take 20 mg by mouth daily.         . hydrochlorothiazide (HYDRODIURIL) 25 MG tablet   Oral   Take 1 tablet (25 mg total) by mouth daily.   30 tablet   1     .Marland Kitchen.Patient needs to contact office to schedule  App ...   . ibuprofen (ADVIL,MOTRIN) 800 MG tablet   Oral   Take 1 tablet (800 mg total) by mouth 3 (three) times daily.   21 tablet   0   . LORazepam (ATIVAN) 0.5 MG tablet   Oral   Take 0.5 mg by mouth every 6 (six) hours as needed for anxiety.         . naproxen sodium (ANAPROX) 220 MG tablet   Oral   Take 220 mg by mouth 2 (two) times daily with a meal.          BP 152/97  Pulse 81  Temp(Src) 97.7 F (36.5 C) (Oral)  Resp 18  Ht 5\' 5"  (1.651 m)  Wt 200 lb (90.719 kg)  BMI 33.28 kg/m2  SpO2 100% Physical Exam  Nursing note and vitals reviewed. Constitutional: He is oriented to person, place, and time. He appears well-developed.  HENT:  Head: Normocephalic and atraumatic.  Eyes: Conjunctivae and EOM are normal. Pupils are equal, round, and reactive to light.  Neck: Normal range of motion. Neck supple. No JVD present.  Cardiovascular: Normal rate, regular rhythm and intact distal pulses.   Murmur heard. Left sternal boarder, 2nd IC space  Pulmonary/Chest: Effort normal and breath sounds normal. No respiratory distress.  Abdominal: Soft. Bowel sounds are normal. He exhibits no distension. There is no tenderness. There is no rebound and no guarding.  Neurological: He is alert and oriented to person, place, and time.  Skin: Skin is warm.    ED Course   Procedures (including critical care time) Labs Review Labs Reviewed  CBC WITH DIFFERENTIAL  BASIC METABOLIC PANEL  TROPONIN I  URINALYSIS, ROUTINE W REFLEX MICROSCOPIC  URINE RAPID DRUG SCREEN (HOSP PERFORMED)   Imaging Review No results found.   EKG Interpretation   Date/Time:  Saturday August 09 2013 03:28:37 EDT Ventricular Rate:  63 PR Interval:  140 QRS Duration: 92 QT Interval:  378 QTC Calculation: 386 R Axis:   102 Text Interpretation:  Normal sinus rhythm with sinus arrhythmia Rightward  axis Borderline ECG Confirmed by Rhunette Croft, MD, Manolo Bosket ([id number]) on 08/09/2013  3:31:44 AM      MDM   Final diagnoses:  None    Pt comes in with cc of chest pain. Very atypical chest pain. Hx of HTN. Pain is left sided, and has been present intermittently for a while. Pt has HEART score of just 1 (for 2 risk factors  -smoking, and HTN). Pt's has a 2/5 systolic murmur - left IC space, otherwise normal cardiovascular exam. Family hx is negative for premature CAD, dysrhythmias, EKG tracing is reassuring.  Seen in the ER for atypical chest pain before. Cards have seen him before, 1/14 - and there was a recommendation for possible BMET at that time. Pt appeared to have had a NSTEMI/ HTN emergency prior to the evaluation.  Will get 2 trops, if negative, will ask him to see Cardiologist again, to see if any provocative testing is indicated.  Derwood Kaplan, MD 08/09/13 [phone]

## 2013-08-09 NOTE — ED Notes (Signed)
Pt reports he is out of Xanax. Sts he takes the Xanax when he goes to work.  Reports this is second time the pain has come back, and it's always happened at work.

## 2013-08-09 NOTE — ED Notes (Signed)
Pt made aware of wait at this time.  Pt aware that we are awaiting test results from blood drawn in 1 hour

## 2013-08-09 NOTE — Discharge Instructions (Signed)
We saw you in the ER for the chest pain/shortness of breath/near fainting All of our cardiac workup is normal, including labs, EKG. We are not sure what is causing your discomfort, but we feel comfortable sending you home at this time. The workup in the ER is not complete, and you should follow up with your primary care doctor for further evaluation. Return to the ER if there is worsening of your symptoms.   Chest Pain (Nonspecific) It is often hard to give a specific diagnosis for the cause of chest pain. There is always a chance that your pain could be related to something serious, such as a heart attack or a blood clot in the lungs. You need to follow up with your caregiver for further evaluation. CAUSES   Heartburn.  Pneumonia or bronchitis.  Anxiety or stress.  Inflammation around your heart (pericarditis) or lung (pleuritis or pleurisy).  A blood clot in the lung.  A collapsed lung (pneumothorax). It can develop suddenly on its own (spontaneous pneumothorax) or from injury (trauma) to the chest.  Shingles infection (herpes zoster virus). The chest wall is composed of bones, muscles, and cartilage. Any of these can be the source of the pain.  The bones can be bruised by injury.  The muscles or cartilage can be strained by coughing or overwork.  The cartilage can be affected by inflammation and become sore (costochondritis). DIAGNOSIS  Lab tests or other studies, such as X-rays, electrocardiography, stress testing, or cardiac imaging, may be needed to find the cause of your pain.  TREATMENT   Treatment depends on what may be causing your chest pain. Treatment may include:  Acid blockers for heartburn.  Anti-inflammatory medicine.  Pain medicine for inflammatory conditions.  Antibiotics if an infection is present.  You may be advised to change lifestyle habits. This includes stopping smoking and avoiding alcohol, caffeine, and chocolate.  You may be advised to keep  your head raised (elevated) when sleeping. This reduces the chance of acid going backward from your stomach into your esophagus.  Most of the time, nonspecific chest pain will improve within 2 to 3 days with rest and mild pain medicine. HOME CARE INSTRUCTIONS   If antibiotics were prescribed, take your antibiotics as directed. Finish them even if you start to feel better.  For the next few days, avoid physical activities that bring on chest pain. Continue physical activities as directed.  Do not smoke.  Avoid drinking alcohol.  Only take over-the-counter or prescription medicine for pain, discomfort, or fever as directed by your caregiver.  Follow your caregiver's suggestions for further testing if your chest pain does not go away.  Keep any follow-up appointments you made. If you do not go to an appointment, you could develop lasting (chronic) problems with pain. If there is any problem keeping an appointment, you must call to reschedule. SEEK MEDICAL CARE IF:   You think you are having problems from the medicine you are taking. Read your medicine instructions carefully.  Your chest pain does not go away, even after treatment.  You develop a rash with blisters on your chest. SEEK IMMEDIATE MEDICAL CARE IF:   You have increased chest pain or pain that spreads to your arm, neck, jaw, back, or abdomen.  You develop shortness of breath, an increasing cough, or you are coughing up blood.  You have severe back or abdominal pain, feel nauseous, or vomit.  You develop severe weakness, fainting, or chills.  You have a fever. THIS  IS AN EMERGENCY. Do not wait to see if the pain will go away. Get medical help at once. Call your local emergency services (911 in [place] Do not drive yourself to the hospital. MAKE SURE YOU:   Understand these instructions.  Will watch your condition.  Will get help right away if you are not doing well or get worse. Document Released: 02/08/2005 Document  Revised: 07/24/2011 Document Reviewed: 12/05/2007 [facility] Patient Information 2014 [place], [place]. Near-Syncope Near-syncope (commonly known as near fainting) is sudden weakness, dizziness, or feeling like you might pass out. During an episode of near-syncope, you may also develop pale skin, have tunnel vision, or feel sick to your stomach (nauseous). Near-syncope may occur when getting up after sitting or while standing for a long time. It is caused by a sudden decrease in blood flow to the brain. This decrease can result from various causes or triggers, most of which are not serious. However, because near-syncope can sometimes be a sign of something serious, a medical evaluation is required. The specific cause is often not determined. HOME CARE INSTRUCTIONS  Monitor your condition for any changes. The following actions may help to alleviate any discomfort you are experiencing:  Have someone stay with you until you feel stable.  Lie down right away if you start feeling like you might faint. Breathe deeply and steadily. Wait until all the symptoms have passed. Most of these episodes last only a few minutes. You may feel tired for several hours.   Drink enough fluids to keep your urine clear or pale yellow.   If you are taking blood pressure or heart medicine, get up slowly when seated or lying down. Take several minutes to sit and then stand. This can reduce dizziness.  Follow up with your health care provider as directed. SEEK IMMEDIATE MEDICAL CARE IF:   You have a severe headache.   You have unusual pain in the chest, abdomen, or back.   You are bleeding from the mouth or rectum, or you have black or tarry stool.   You have an irregular or very fast heartbeat.   You have repeated fainting or have seizure-like jerking during an episode.   You faint when sitting or lying down.   You have confusion.   You have difficulty walking.   You have severe weakness.   You  have vision problems.  MAKE SURE YOU:   Understand these instructions.  Will watch your condition.  Will get help right away if you are not doing well or get worse. Document Released: 05/01/2005 Document Revised: 01/01/2013 Document Reviewed: 10/04/2012 [facility] Patient Information 2014 [place], [place].

## 2013-11-12 ENCOUNTER — Emergency Department (HOSPITAL_COMMUNITY)
Admission: EM | Admit: 2013-11-12 | Discharge: 2013-11-12 | Disposition: A | Discharge status: Home / Self Care | Payer: Managed Care, Other (non HMO) | Attending: Emergency Medicine | Admitting: Emergency Medicine

## 2013-11-12 ENCOUNTER — Encounter (HOSPITAL_COMMUNITY): Payer: Self-pay | Admitting: Emergency Medicine

## 2013-11-12 DIAGNOSIS — Z888 Allergy status to other drugs, medicaments and biological substances status: Secondary | ICD-10-CM | POA: Insufficient documentation

## 2013-11-12 DIAGNOSIS — Z79899 Other long term (current) drug therapy: Secondary | ICD-10-CM | POA: Insufficient documentation

## 2013-11-12 DIAGNOSIS — F411 Generalized anxiety disorder: Secondary | ICD-10-CM | POA: Insufficient documentation

## 2013-11-12 DIAGNOSIS — F17200 Nicotine dependence, unspecified, uncomplicated: Secondary | ICD-10-CM | POA: Insufficient documentation

## 2013-11-12 DIAGNOSIS — K089 Disorder of teeth and supporting structures, unspecified: Secondary | ICD-10-CM | POA: Insufficient documentation

## 2013-11-12 DIAGNOSIS — K0889 Other specified disorders of teeth and supporting structures: Secondary | ICD-10-CM

## 2013-11-12 DIAGNOSIS — I10 Essential (primary) hypertension: Secondary | ICD-10-CM | POA: Insufficient documentation

## 2013-11-12 MED ORDER — TRAMADOL HCL 50 MG PO TABS: 50.0000 mg | ORAL_TABLET | Freq: Four times a day (QID) | ORAL | Status: DC | PRN

## 2013-11-12 MED ORDER — PENICILLIN V POTASSIUM 500 MG PO TABS: 500.0000 mg | ORAL_TABLET | Freq: Four times a day (QID) | ORAL | Status: DC

## 2013-11-12 NOTE — ED Provider Notes (Signed)
CSN: [id number]     Arrival date & time 11/12/13  2137 History  This chart was scribed for Nathan SilkHannah Hermena Swint, PA-C working with Gilda Creasehristopher J. Pollina, MD by Evon Slackerrance Branch, ED Scribe. This patient was seen in room TR07C/TR07C and the patient's care was started at 10:34 PM.    Chief Complaint  Patient presents with  . Dental Pain   Patient is a 28 y.o. male presenting with tooth pain. The history is provided by the patient. No language interpreter was used.  Dental Pain Associated symptoms: no fever    HPI Comments: Nathan Nelson is a 28 y.o. male who presents to the Emergency Department complaining of dental pain onset 7 days. He states that his pain has been intermittent, but recently worsened today. He describes the pain as aching and sharp. He states that the pain radiates to his ear. He denies fever, chills, nausea, or vomiting.   He states that he has a Education officer, communitydentist appointment tomorrow. [place] [facility] [place] and he cannot remember their name.   Past Medical History  Diagnosis Date  . Hypertension   . Anxiety    Past Surgical History  Procedure Laterality Date  . Knee surgery     History reviewed. No pertinent family history. History  Substance Use Topics  . Smoking status: Current Every Day Smoker    Types: Cigarettes  . Smokeless tobacco: Not on file  . Alcohol Use: Yes     Comment: occasionally    Review of Systems  Constitutional: Negative for fever and chills.  HENT: Positive for dental problem. Negative for sore throat and trouble swallowing.   Gastrointestinal: Negative for nausea and vomiting.   Allergies  Lisinopril  Home Medications   Prior to Admission medications   Medication Sig Start Date End Date Taking? Authorizing Provider  ALPRAZolam Prudy Feeler(XANAX) 0.5 MG tablet Take 0.5 mg by mouth daily as needed for anxiety.   Yes Historical Provider, MD  amLODipine (NORVASC) 10 MG tablet Take 1 tablet (10 mg total) by mouth daily. 08/01/13  Yes Wendall StadePeter C Nishan, MD   hydrochlorothiazide (HYDRODIURIL) 25 MG tablet Take 1 tablet (25 mg total) by mouth daily. 06/20/13  Yes Wendall StadePeter C Nishan, MD   Triage Vitals: BP 167/108  Pulse 91  Temp(Src) 97.8 F (36.6 C) (Oral)  Resp 18  SpO2 100%  Physical Exam  Nursing note and vitals reviewed. Constitutional: He is oriented to person, place, and time. He appears well-developed and well-nourished. No distress.  HENT:  Head: Normocephalic and atraumatic.  Right Ear: External ear normal.  Left Ear: External ear normal.  Nose: Nose normal.  Generally poor dentition, swelling to gum around right lower molar. No drainable abscess.  No trismus, submental edema, or tongue elevation.   Eyes: Conjunctivae are normal.  Neck: Normal range of motion. No tracheal deviation present.  Cardiovascular: Normal rate, regular rhythm and normal heart sounds.   Pulmonary/Chest: Effort normal and breath sounds normal. No stridor.  Abdominal: Soft. He exhibits no distension. There is no tenderness.  Musculoskeletal: Normal range of motion.  Neurological: He is alert and oriented to person, place, and time.  Skin: Skin is warm and dry. He is not diaphoretic.  Psychiatric: He has a normal mood and affect. His behavior is normal.    ED Course  Dental Date/Time: 11/12/2013 10:43 PM Performed by: Mora BellmanMERRELL, Koston Hennes S Authorized by: Mora BellmanMERRELL, Loomis Anacker S Consent: Verbal consent obtained. written consent not obtained. The procedure was performed in an emergent situation. Risks  and benefits: risks, benefits and alternatives were discussed Consent given by: patient Patient understanding: patient states understanding of the procedure being performed Patient consent: the patient's understanding of the procedure matches consent given Required items: required blood products, implants, devices, and special equipment available Patient identity confirmed: verbally with patient and arm band Time out: Immediately prior to procedure a "time out" was  called to verify the correct patient, procedure, equipment, support staff and site/side marked as required. Local anesthesia used: yes Anesthesia: nerve block Local anesthetic: bupivacaine 0.5% with epinephrine Anesthetic total: 1.8 ml Patient sedated: no Patient tolerance: Patient tolerated the procedure well with no immediate complications.   (including critical care time) DIAGNOSTIC STUDIES: Oxygen Saturation is 100% on RA, normal by my interpretation.    COORDINATION OF CARE:    Labs Review Labs Reviewed - No data to display  Imaging Review No results found.   EKG Interpretation None      MDM   Final diagnoses:  Pain, dental    Patient with toothache.  No gross abscess.  Exam unconcerning for Ludwig's angina or spread of infection.  Will treat with penicillin and pain medicine.  Urged patient to follow-up with dentist.    I personally performed the services described in this documentation, which was scribed in my presence. The recorded information has been reviewed and is accurate.       Mora BellmanHannah S [name], PA-C 11/12/13 2252

## 2013-11-12 NOTE — Discharge Instructions (Signed)
Dental Pain  Toothache is pain in or around a tooth. It may get worse with chewing or with cold or heat.   HOME CARE  · Your dentist may use a numbing medicine during treatment. If so, you may need to avoid eating until the medicine wears off. Ask your dentist about this.  · Only take medicine as told by your dentist or doctor.  · Avoid chewing food near the painful tooth until after all treatment is done. Ask your dentist about this.  GET HELP RIGHT AWAY IF:   · The problem gets worse or new problems appear.  · You have a fever.  · There is redness and puffiness (swelling) of the face, jaw, or neck.  · You cannot open your mouth.  · There is pain in the jaw.  · There is very bad pain that is not helped by medicine.  MAKE SURE YOU:   · Understand these instructions.  · Will watch your condition.  · Will get help right away if you are not doing well or get worse.  Document Released: 10/18/2007 Document Revised: 07/24/2011 Document Reviewed: 10/18/2007  [employer]® Patient Information ©2015 [employer]. This information is not intended to replace advice given to you by your health care provider. Make sure you discuss any questions you have with your health care provider.

## 2013-11-12 NOTE — ED Notes (Signed)
Pt states he has a dental appt tomorrow

## 2013-11-12 NOTE — ED Notes (Signed)
Patient with dental pain that got worse today.  Patient states that he can't go to dentist until tomorrow.

## 2013-11-13 NOTE — ED Provider Notes (Signed)
Medical screening examination/treatment/procedure(s) were performed by non-physician practitioner and as supervising physician I was immediately available for consultation/collaboration.   EKG Interpretation None        Christopher J. Pollina, MD 11/13/13 0009 

## 2013-12-15 ENCOUNTER — Emergency Department (HOSPITAL_BASED_OUTPATIENT_CLINIC_OR_DEPARTMENT_OTHER): Payer: Managed Care, Other (non HMO)

## 2013-12-15 ENCOUNTER — Encounter (HOSPITAL_BASED_OUTPATIENT_CLINIC_OR_DEPARTMENT_OTHER): Payer: Self-pay | Admitting: Emergency Medicine

## 2013-12-15 ENCOUNTER — Emergency Department (HOSPITAL_BASED_OUTPATIENT_CLINIC_OR_DEPARTMENT_OTHER)
Admission: EM | Admit: 2013-12-15 | Discharge: 2013-12-16 | Disposition: A | Discharge status: Home / Self Care | Payer: Managed Care, Other (non HMO) | Attending: Emergency Medicine | Admitting: Emergency Medicine

## 2013-12-15 DIAGNOSIS — R071 Chest pain on breathing: Secondary | ICD-10-CM | POA: Insufficient documentation

## 2013-12-15 DIAGNOSIS — R0789 Other chest pain: Secondary | ICD-10-CM

## 2013-12-15 DIAGNOSIS — R079 Chest pain, unspecified: Secondary | ICD-10-CM | POA: Insufficient documentation

## 2013-12-15 DIAGNOSIS — I10 Essential (primary) hypertension: Secondary | ICD-10-CM | POA: Insufficient documentation

## 2013-12-15 DIAGNOSIS — F411 Generalized anxiety disorder: Secondary | ICD-10-CM | POA: Insufficient documentation

## 2013-12-15 DIAGNOSIS — F17200 Nicotine dependence, unspecified, uncomplicated: Secondary | ICD-10-CM | POA: Insufficient documentation

## 2013-12-15 DIAGNOSIS — Z79899 Other long term (current) drug therapy: Secondary | ICD-10-CM | POA: Insufficient documentation

## 2013-12-15 LAB — CBC WITH DIFFERENTIAL/PLATELET
Basophils Absolute: 0 10*3/uL (ref 0.0–0.1)
Basophils Relative: 0 % (ref 0–1)
EOS PCT: 6 % — AB (ref 0–5)
Eosinophils Absolute: 0.5 10*3/uL (ref 0.0–0.7)
HEMATOCRIT: 45.8 % (ref 39.0–52.0)
HEMOGLOBIN: 15.6 g/dL (ref 13.0–17.0)
LYMPHS ABS: 2 10*3/uL (ref 0.7–4.0)
Lymphocytes Relative: 23 % (ref 12–46)
MCH: 30.5 pg (ref 26.0–34.0)
MCHC: 34.1 g/dL (ref 30.0–36.0)
MCV: 89.5 fL (ref 78.0–100.0)
MONO ABS: 0.9 10*3/uL (ref 0.1–1.0)
MONOS PCT: 10 % (ref 3–12)
NEUTROS ABS: 5.3 10*3/uL (ref 1.7–7.7)
Neutrophils Relative %: 61 % (ref 43–77)
Platelets: 380 10*3/uL (ref 150–400)
RBC: 5.12 MIL/uL (ref 4.22–5.81)
RDW: 14.6 % (ref 11.5–15.5)
WBC: 8.7 10*3/uL (ref 4.0–10.5)

## 2013-12-15 LAB — COMPREHENSIVE METABOLIC PANEL
ALT: 23 U/L (ref 0–53)
ANION GAP: 14 (ref 5–15)
AST: 23 U/L (ref 0–37)
Albumin: 3.8 g/dL (ref 3.5–5.2)
Alkaline Phosphatase: 99 U/L (ref 39–117)
BILIRUBIN TOTAL: 0.7 mg/dL (ref 0.3–1.2)
BUN: 13 mg/dL (ref 6–23)
CHLORIDE: 95 meq/L — AB (ref 96–112)
CO2: 28 mEq/L (ref 19–32)
CREATININE: 1 mg/dL (ref 0.50–1.35)
Calcium: 10 mg/dL (ref 8.4–10.5)
GFR calc non Af Amer: >90 mL/min (ref >90)
Glucose, Bld: 126 mg/dL — ABNORMAL HIGH (ref 70–99)
POTASSIUM: 3.4 meq/L — AB (ref 3.7–5.3)
Sodium: 137 mEq/L (ref 137–147)
Total Protein: 7.7 g/dL (ref 6.0–8.3)

## 2013-12-15 LAB — TROPONIN I

## 2013-12-15 MED ORDER — SODIUM CHLORIDE 0.9 % IV SOLN
Freq: Once | INTRAVENOUS | Status: AC
  Administered 2013-12-15: 23:00:00 via INTRAVENOUS

## 2013-12-15 MED ORDER — GI COCKTAIL ~~LOC~~
30.0000 mL | Freq: Once | ORAL | Status: AC
  Administered 2013-12-16: 30 mL via ORAL
  Filled 2013-12-15: qty 30

## 2013-12-15 MED ORDER — KETOROLAC TROMETHAMINE 30 MG/ML IJ SOLN
30.0000 mg | Freq: Once | INTRAMUSCULAR | Status: AC
  Administered 2013-12-15: 30 mg via INTRAVENOUS
  Filled 2013-12-15: qty 1

## 2013-12-15 NOTE — ED Notes (Signed)
Pt reports chest pain onset while in bed at 2115 that increased while driving to work tonight pt reports hx of HTN

## 2013-12-15 NOTE — ED Provider Notes (Signed)
CSN: [id number]     Arrival date & time 12/15/13  2237 History  This chart was scribed for Ulys Favia Smitty CordsK Daren Yeagle-Rasch, MD by Modena JanskyAlbert Thayil, ED Scribe. This patient was seen in room MH02/MH02 and the patient's care was started at 11:14 PM.   Chief Complaint  Patient presents with  . Chest Pain   Patient is a 28 y.o. male presenting with chest pain. The history is provided by the patient. No language interpreter was used.  Chest Pain Pain location:  L chest Pain quality: aching   Pain radiates to:  Does not radiate Pain radiates to the back: no   Pain severity:  Moderate Onset quality:  Sudden Timing:  Constant Progression:  Worsening Chronicity:  New Context: lifting   Relieved by:  Nothing Worsened by:  Exertion Ineffective treatments: Xanex. Associated symptoms: no claudication, no cough, no diaphoresis, no lower extremity edema, no numbness, no orthopnea, no palpitations, no syncope and not vomiting   Risk factors: no aortic disease   No swelling nor pain in the legs.  No long car trips or plane trips symptoms occur with lifting heavy objects at work.  When it started he started to feel anxious and took a xanax.   HPI Comments: Nathan Nelson is a 28 y.o. male with a hx of HTN and anxiety who presents to the Emergency Department complaining of constant moderate chest pain that started today. He reports that the pain started while he was in bed and got worse when he started driving to work today. He states that the pain is left sided. He reports that the pain is exacerbated by heavy lifting. He describes the pain as a achy feeling. He rates the severity of the pain currently as a 5/10. He reports that he took Xanex and pain medication with no relief. He denies any injury to left chest area. He reports associated SOB. He states that he sometimes lifts weights at a gym. He denies any recent long trips. He also denies any swelling or pain in his legs.    Past Medical History  Diagnosis Date  .  Hypertension   . Anxiety    Past Surgical History  Procedure Laterality Date  . Knee surgery     History reviewed. No pertinent family history. History  Substance Use Topics  . Smoking status: Current Every Day Smoker    Types: Cigarettes  . Smokeless tobacco: Not on file  . Alcohol Use: Yes     Comment: occasionally    Review of Systems  Constitutional: Negative for diaphoresis.  Respiratory: Negative for cough.   Cardiovascular: Positive for chest pain. Negative for palpitations, orthopnea, claudication, leg swelling and syncope.  Gastrointestinal: Negative for vomiting.  Neurological: Negative for numbness.  All other systems reviewed and are negative.   Allergies  Lisinopril  Home Medications   Prior to Admission medications   Medication Sig Start Date End Date Taking? Authorizing Provider  ALPRAZolam Prudy Feeler(XANAX) 0.5 MG tablet Take 0.5 mg by mouth daily as needed for anxiety.    Historical Provider, MD  amLODipine (NORVASC) 10 MG tablet Take 1 tablet (10 mg total) by mouth daily. 08/01/13   Wendall StadePeter C Nishan, MD  hydrochlorothiazide (HYDRODIURIL) 25 MG tablet Take 1 tablet (25 mg total) by mouth daily. 06/20/13   Wendall StadePeter C Nishan, MD  penicillin v potassium (VEETID) 500 MG tablet Take 1 tablet (500 mg total) by mouth 4 (four) times daily. 11/12/13   Mora BellmanHannah S Merrell, PA-C  traMADol Janean Sark(ULTRAM) 50  MG tablet Take 1 tablet (50 mg total) by mouth every 6 (six) hours as needed. 11/12/13   Mora Bellman, PA-C   BP 129/87  Pulse 68  Temp(Src) 98.5 F (36.9 C)  Resp 16  Ht 5\' 5"  (1.651 m)  Wt 200 lb (90.719 kg)  BMI 33.28 kg/m2  SpO2 100% Physical Exam  Nursing note and vitals reviewed. Constitutional: He is oriented to person, place, and time. He appears well-developed and well-nourished. No distress.  HENT:  Head: Normocephalic and atraumatic.  Mouth/Throat: Oropharynx is clear and moist.  Moist mucous membranes.   Eyes: EOM are normal. Pupils are equal, round, and reactive to  light.  Neck: Normal range of motion. Neck supple. No tracheal deviation present.  Cardiovascular: Normal rate and regular rhythm.   Pulmonary/Chest: Effort normal. No respiratory distress. He has no wheezes. He has no rales. He exhibits tenderness.  Abdominal: Soft. There is no tenderness. There is no rebound and no guarding.  Hyperactive bowel sounds  Musculoskeletal: Normal range of motion. He exhibits no edema and no tenderness.  No cords  Neurological: He is alert and oriented to person, place, and time. He displays normal reflexes.  Skin: Skin is warm and dry.  Psychiatric: He has a normal mood and affect. His behavior is normal.    ED Course  Procedures (including critical care time) DIAGNOSTIC STUDIES: Oxygen Saturation is 100% on RA, normal by my interpretation.    COORDINATION OF CARE: 11:18 PM- Pt advised of plan for treatment which includes medication, radiology, and labs and pt agrees.  Labs Review Labs Reviewed  CBC WITH DIFFERENTIAL - Abnormal; Notable for the following:    Eosinophils Relative 6 (*)    All other components within normal limits  COMPREHENSIVE METABOLIC PANEL - Abnormal; Notable for the following:    Potassium 3.4 (*)    Chloride 95 (*)    Glucose, Bld 126 (*)    All other components within normal limits  TROPONIN I    Imaging Review No results found.   EKG Interpretation   Date/Time:  Monday December 15 2013 22:44:52 EDT Ventricular Rate:  64 PR Interval:  132 QRS Duration: 98 QT Interval:  404 QTC Calculation: 416 R Axis:   91 Text Interpretation:  Normal sinus rhythm with sinus arrhythmia Rightward  axis Borderline ECG No significant change since last tracing Confirmed by  WARD,  DO, KRISTEN ([id number]) on 12/15/2013 10:55:09 PM      MDM   Final diagnoses:  None    PERC negative wells 0, highly doubt PE.  Highly Doubt ACS. Symptoms worsen with lifting at the gym and at work indicative of MSK pain.  Will treat for pain   I  personally performed the services described in this documentation, which was scribed in my presence. The recorded information has been reviewed and is accurate.    Jasmine Awe, MD 12/16/13 [phone]

## 2013-12-16 ENCOUNTER — Encounter (HOSPITAL_BASED_OUTPATIENT_CLINIC_OR_DEPARTMENT_OTHER): Payer: Self-pay | Admitting: Emergency Medicine

## 2013-12-16 LAB — TROPONIN I: Troponin I: <0.3 ng/mL (ref <0.30)

## 2013-12-16 MED ORDER — METHOCARBAMOL 500 MG PO TABS: 500.0000 mg | ORAL_TABLET | Freq: Two times a day (BID) | ORAL | Status: DC

## 2013-12-16 MED ORDER — MELOXICAM 7.5 MG PO TABS: 7.5000 mg | ORAL_TABLET | Freq: Every day | ORAL | Status: DC

## 2013-12-16 NOTE — Discharge Instructions (Signed)

## 2014-01-27 ENCOUNTER — Encounter (HOSPITAL_BASED_OUTPATIENT_CLINIC_OR_DEPARTMENT_OTHER): Payer: Self-pay | Admitting: Emergency Medicine

## 2014-01-27 ENCOUNTER — Emergency Department (HOSPITAL_BASED_OUTPATIENT_CLINIC_OR_DEPARTMENT_OTHER)
Admission: EM | Admit: 2014-01-27 | Discharge: 2014-01-27 | Disposition: A | Discharge status: Home / Self Care | Payer: Managed Care, Other (non HMO) | Attending: Emergency Medicine | Admitting: Emergency Medicine

## 2014-01-27 ENCOUNTER — Emergency Department (HOSPITAL_BASED_OUTPATIENT_CLINIC_OR_DEPARTMENT_OTHER): Payer: Managed Care, Other (non HMO)

## 2014-01-27 DIAGNOSIS — F411 Generalized anxiety disorder: Secondary | ICD-10-CM | POA: Diagnosis not present

## 2014-01-27 DIAGNOSIS — F17200 Nicotine dependence, unspecified, uncomplicated: Secondary | ICD-10-CM | POA: Diagnosis not present

## 2014-01-27 DIAGNOSIS — R071 Chest pain on breathing: Secondary | ICD-10-CM | POA: Diagnosis not present

## 2014-01-27 DIAGNOSIS — I10 Essential (primary) hypertension: Secondary | ICD-10-CM | POA: Insufficient documentation

## 2014-01-27 DIAGNOSIS — Z79899 Other long term (current) drug therapy: Secondary | ICD-10-CM | POA: Diagnosis not present

## 2014-01-27 DIAGNOSIS — R0789 Other chest pain: Secondary | ICD-10-CM

## 2014-01-27 DIAGNOSIS — R1013 Epigastric pain: Secondary | ICD-10-CM | POA: Insufficient documentation

## 2014-01-27 DIAGNOSIS — R079 Chest pain, unspecified: Secondary | ICD-10-CM | POA: Diagnosis present

## 2014-01-27 DIAGNOSIS — Z791 Long term (current) use of non-steroidal anti-inflammatories (NSAID): Secondary | ICD-10-CM | POA: Insufficient documentation

## 2014-01-27 MED ORDER — OMEPRAZOLE 20 MG PO CPDR: 20.0000 mg | DELAYED_RELEASE_CAPSULE | Freq: Every day | ORAL | Status: DC

## 2014-01-27 MED ORDER — MELOXICAM 7.5 MG PO TABS: 7.5000 mg | ORAL_TABLET | Freq: Every day | ORAL | Status: DC

## 2014-01-27 MED ORDER — METHOCARBAMOL 500 MG PO TABS: 500.0000 mg | ORAL_TABLET | Freq: Two times a day (BID) | ORAL | Status: DC

## 2014-01-27 MED ORDER — KETOROLAC TROMETHAMINE 60 MG/2ML IM SOLN
60.0000 mg | Freq: Once | INTRAMUSCULAR | Status: AC
  Administered 2014-01-27: 60 mg via INTRAMUSCULAR
  Filled 2014-01-27: qty 2

## 2014-01-27 MED ORDER — GI COCKTAIL ~~LOC~~
30.0000 mL | Freq: Once | ORAL | Status: AC
  Administered 2014-01-27: 30 mL via ORAL
  Filled 2014-01-27: qty 30

## 2014-01-27 NOTE — ED Notes (Signed)
Pt reports intermittent epigastric pain while working that radiates through to back area with associated sob while working.

## 2014-01-27 NOTE — ED Notes (Signed)
Patient transported to X-ray ambulatory with tech. 

## 2014-01-27 NOTE — ED Provider Notes (Addendum)
CSN: [id number]     Arrival date & time 01/27/14  0302 History   First MD Initiated Contact with Patient 01/27/14 [phone]     Chief Complaint  Patient presents with  . Chest Pain     (Consider location/radiation/quality/duration/timing/severity/associated sxs/prior Treatment) Patient is a 28 y.o. male presenting with chest pain. The history is provided by the patient.  Chest Pain Pain location:  Epigastric Pain quality: aching   Pain radiates to:  Does not radiate (has pain in back too but not continguous) Pain severity:  Moderate Onset quality:  Gradual Timing:  Constant Progression:  Unchanged Chronicity:  Recurrent Context: lifting and movement   Context: no trauma   Relieved by:  Nothing Worsened by:  Nothing tried Ineffective treatments:  None tried Associated symptoms: no anorexia, no cough, no diaphoresis, no fatigue, no fever, no lower extremity edema, no nausea, no orthopnea, no palpitations, no PND, no syncope and not vomiting   Risk factors: no aortic disease   Started with lifting at work.  Hurts to move.  No cough.  No leg swelling no palpitations.  No long car trips or plane trips.    Past Medical History  Diagnosis Date  . Hypertension   . Anxiety    Past Surgical History  Procedure Laterality Date  . Knee surgery     No family history on file. History  Substance Use Topics  . Smoking status: Light Tobacco Smoker    Types: Cigarettes  . Smokeless tobacco: Not on file  . Alcohol Use: Yes     Comment: occasionally    Review of Systems  Constitutional: Negative for fever, diaphoresis and fatigue.  Respiratory: Negative for cough and wheezing.   Cardiovascular: Positive for chest pain. Negative for palpitations, orthopnea, leg swelling, syncope and PND.  Gastrointestinal: Negative for nausea, vomiting and anorexia.  All other systems reviewed and are negative.     Allergies  Lisinopril  Home Medications   Prior to Admission medications    Medication Sig Start Date End Date Taking? Authorizing Provider  ALPRAZolam Prudy Feeler) 0.5 MG tablet Take 0.5 mg by mouth daily as needed for anxiety.    Historical Provider, MD  amLODipine (NORVASC) 10 MG tablet Take 1 tablet (10 mg total) by mouth daily. 08/01/13   Wendall Stade, MD  hydrochlorothiazide (HYDRODIURIL) 25 MG tablet Take 1 tablet (25 mg total) by mouth daily. 06/20/13   Wendall Stade, MD  meloxicam (MOBIC) 7.5 MG tablet Take 1 tablet (7.5 mg total) by mouth daily. 12/16/13   Dyllan Hughett K Lowell Makara-Rasch, MD  meloxicam (MOBIC) 7.5 MG tablet Take 1 tablet (7.5 mg total) by mouth daily. 01/27/14   Amberley Hamler K Johnel Yielding-Rasch, MD  methocarbamol (ROBAXIN) 500 MG tablet Take 1 tablet (500 mg total) by mouth 2 (two) times daily. 12/16/13   Kemaya Dorner K Latiqua Daloia-Rasch, MD  methocarbamol (ROBAXIN) 500 MG tablet Take 1 tablet (500 mg total) by mouth 2 (two) times daily. 01/27/14   Madalyne Husk K Mozella Rexrode-Rasch, MD  omeprazole (PRILOSEC) 20 MG capsule Take 1 capsule (20 mg total) by mouth daily. 01/27/14   Mainor Hellmann K Ramondo Dietze-Rasch, MD  penicillin v potassium (VEETID) 500 MG tablet Take 1 tablet (500 mg total) by mouth 4 (four) times daily. 11/12/13   Mora Bellman, PA-C  traMADol (ULTRAM) 50 MG tablet Take 1 tablet (50 mg total) by mouth every 6 (six) hours as needed. 11/12/13   Ramon Dredge Merrell, PA-C   BP 154/93  Pulse 96  Temp(Src) 98.1 F (36.7 C) (  Oral)  Resp 20  Ht  (1.626 m)  Wt 190 lb (86.183 kg)  BMI 32.60 kg/m2  SpO2 100% Physical Exam  Constitutional: He is oriented to person, place, and time. He appears well-developed and well-nourished. No distress.  HENT:  Head: Normocephalic and atraumatic.  Mouth/Throat: Oropharynx is clear and moist.  Eyes: Conjunctivae are normal. Pupils are equal, round, and reactive to light.  Neck: Normal range of motion. Neck supple.  Cardiovascular: Normal rate, regular rhythm and intact distal pulses.   Pulmonary/Chest: Effort normal and breath sounds normal. No respiratory  distress. He has no wheezes. He has no rales. He exhibits tenderness.  Abdominal: Soft. Bowel sounds are increased. There is no tenderness. There is no rebound and no guarding.  Musculoskeletal: Normal range of motion. He exhibits no edema and no tenderness.  Neurological: He is alert and oriented to person, place, and time.  Skin: Skin is warm and dry.  Psychiatric: He has a normal mood and affect.    ED Course  Procedures (including critical care time) Labs Review Labs Reviewed - No data to display  Imaging Review Dg Chest 2 View  01/27/2014   CLINICAL DATA:  Intermittent epigastric pain radiating to the back. Shortness of breath.  EXAM: CHEST  2 VIEW  COMPARISON:  Chest radiograph December 15, 2013  FINDINGS: The heart size and mediastinal contours are within normal limits. Both lungs are clear. The visualized skeletal structures are non acute; gynecomastia.  IMPRESSION: No active cardiopulmonary disease.   Electronically Signed   By: Awilda Metro   On: 01/27/2014 03:47     EKG Interpretation   Date/Time:  Tuesday January 27 2014 03:18:21 EDT Ventricular Rate:  77 PR Interval:  144 QRS Duration: 88 QT Interval:  360 QTC Calculation: 407 R Axis:   92 Text Interpretation:  Normal sinus rhythm with sinus arrhythmia Rightward  axis Confirmed by [facility]  MD, Holman Bonsignore ([id number]) on 01/27/2014 3:44:19 AM      MDM   Final diagnoses:  Chest wall pain  Seen regularly for same following lifting at work.  EKG unchanged and there are no acute findings on CXR.  Highly doubt ACS as patient has history of same with multiple recent work ups and unchanged EKG.   PERC negative wells 0 highly doubt PE.  Patient remains active and has not been bed bound for any reason.  He denies travel history or leg pain nor swelling.    States medications resolved pain,  Still having a lot of gas on exam.  Pain is classically MSK pain will add GERD medication as well.  Have advised lifting strategies.Marland Kitchen        Jasmine Awe, MD 01/27/14 [phone]

## 2014-01-27 NOTE — Discharge Instructions (Signed)

## 2014-01-27 NOTE — ED Notes (Signed)
MD at bedside. 

## 2014-02-13 ENCOUNTER — Encounter (HOSPITAL_BASED_OUTPATIENT_CLINIC_OR_DEPARTMENT_OTHER): Payer: Self-pay | Admitting: Emergency Medicine

## 2014-02-13 ENCOUNTER — Emergency Department (HOSPITAL_BASED_OUTPATIENT_CLINIC_OR_DEPARTMENT_OTHER)
Admission: EM | Admit: 2014-02-13 | Discharge: 2014-02-13 | Disposition: A | Discharge status: Home / Self Care | Payer: Managed Care, Other (non HMO) | Attending: Emergency Medicine | Admitting: Emergency Medicine

## 2014-02-13 ENCOUNTER — Emergency Department (HOSPITAL_BASED_OUTPATIENT_CLINIC_OR_DEPARTMENT_OTHER): Payer: Managed Care, Other (non HMO)

## 2014-02-13 DIAGNOSIS — Z9889 Other specified postprocedural states: Secondary | ICD-10-CM | POA: Diagnosis not present

## 2014-02-13 DIAGNOSIS — Y9289 Other specified places as the place of occurrence of the external cause: Secondary | ICD-10-CM | POA: Insufficient documentation

## 2014-02-13 DIAGNOSIS — Z720 Tobacco use: Secondary | ICD-10-CM | POA: Diagnosis not present

## 2014-02-13 DIAGNOSIS — Z79899 Other long term (current) drug therapy: Secondary | ICD-10-CM | POA: Insufficient documentation

## 2014-02-13 DIAGNOSIS — S99811A Other specified injuries of right ankle, initial encounter: Secondary | ICD-10-CM | POA: Diagnosis present

## 2014-02-13 DIAGNOSIS — S93401A Sprain of unspecified ligament of right ankle, initial encounter: Secondary | ICD-10-CM | POA: Diagnosis not present

## 2014-02-13 DIAGNOSIS — X58XXXA Exposure to other specified factors, initial encounter: Secondary | ICD-10-CM | POA: Insufficient documentation

## 2014-02-13 DIAGNOSIS — Z791 Long term (current) use of non-steroidal anti-inflammatories (NSAID): Secondary | ICD-10-CM | POA: Diagnosis not present

## 2014-02-13 DIAGNOSIS — I10 Essential (primary) hypertension: Secondary | ICD-10-CM | POA: Diagnosis not present

## 2014-02-13 DIAGNOSIS — Y990 Civilian activity done for income or pay: Secondary | ICD-10-CM | POA: Diagnosis not present

## 2014-02-13 DIAGNOSIS — F419 Anxiety disorder, unspecified: Secondary | ICD-10-CM | POA: Insufficient documentation

## 2014-02-13 DIAGNOSIS — Y9389 Activity, other specified: Secondary | ICD-10-CM | POA: Diagnosis not present

## 2014-02-13 DIAGNOSIS — Z792 Long term (current) use of antibiotics: Secondary | ICD-10-CM | POA: Diagnosis not present

## 2014-02-13 MED ORDER — TRAMADOL HCL 50 MG PO TABS: 50.0000 mg | ORAL_TABLET | Freq: Four times a day (QID) | ORAL | Status: DC | PRN

## 2014-02-13 MED ORDER — MELOXICAM 7.5 MG PO TABS: 7.5000 mg | ORAL_TABLET | Freq: Every day | ORAL | Status: DC

## 2014-02-13 NOTE — Discharge Instructions (Signed)
Acute Ankle Sprain with Phase II Rehab An acute ankle sprain is a partial or complete tear in one or more of the ligaments of the ankle due to traumatic injury. The severity of the injury depends on both the number of ligaments sprained and the grade of sprain. There are 3 grades of sprains.  A grade 1 sprain is a mild sprain. There is a slight pull without obvious tearing. There is no loss of strength, and the muscle and ligament are the correct length.  A grade 2 sprain is a moderate sprain. There is tearing of fibers within the substance of the ligament where it connects two bones or two cartilages. The length of the ligament is increased, and there is usually decreased strength.  A grade 3 sprain is a complete rupture of the ligament and is uncommon. In addition to the grade of sprain, there are 3 types of ankle sprains.  Lateral ankle sprains. This is a sprain of one or more of the 3 ligaments on the outer side (lateral) of the ankle. These are the most common sprains. Medial ankle sprains. There is one large triangular ligament on the inner side (medial) of the ankle that is susceptible to injury. Medial ankle sprains are less common. Syndesmosis, "high ankle," sprains. The syndesmosis is the ligament that connects the two bones of the lower leg. Syndesmosis sprains usually only occur with very severe ankle sprains. SYMPTOMS  Pain, tenderness, and swelling in the ankle, starting at the side of injury that may progress to the whole ankle and foot with time.  "Pop" or tearing sensation at the time of injury.  Bruising that may spread to the heel.  Impaired ability to walk soon after injury. CAUSES   Acute ankle sprains are caused by trauma placed on the ankle that temporarily forces or pries the anklebone (talus) out of its normal socket.  Stretching or tearing of the ligaments that normally hold the joint in place (usually due to a twisting injury). RISK INCREASES WITH:  Previous  ankle sprain.  Sports in which the foot may land awkwardly (basketball, volleyball, soccer) or walking or running on uneven or rough surfaces.  Shoes with inadequate support to prevent sideways motion when stress occurs.  Poor strength and flexibility.  Poor balance skills.  Contact sports. PREVENTION  Warm up and stretch properly before activity.  Maintain physical fitness:  Ankle and leg flexibility, muscle strength, and endurance.  Cardiovascular fitness.  Balance training activities.  Use proper technique and have a coach correct improper technique.  Taping, protective strapping, bracing, or high-top tennis shoes may help prevent injury. Initially, tape is best. However, it loses most of its support function within 10 to 15 minutes.  Wear proper fitted protective shoes. Combining high-top shoes with taping or bracing is more effective than using either alone.  Provide the ankle with support during sports and practice activities for 12 months following injury. PROGNOSIS   If treated properly, ankle sprains can be expected to recover completely. However, the length of recovery depends on the degree of injury.  A grade 1 sprain usually heals enough in 5 to 7 days to allow modified activity and requires an average of 6 weeks to heal completely.  A grade 2 sprain requires 6 to 10 weeks to heal completely.  A grade 3 sprain requires 12 to 16 weeks to heal.  A syndesmosis sprain often takes more than 3 months to heal. RELATED COMPLICATIONS   Frequent recurrence of symptoms may result   in a chronic problem. Appropriately addressing the problem the first time decreases the frequency of recurrence and optimizes healing time. Severity of initial sprain does not predict the likelihood of later instability.  Injury to other structures (bone, cartilage, or tendon).  Chronically unstable or arthritic ankle joint are possible with repeated sprains. TREATMENT Treatment initially  involves the use of ice, medicine, and compression bandages to help reduce pain and inflammation. Ankle sprains are usually immobilized in a walking cast or boot to allow for healing. Crutches may be recommended to reduce pressure on the injury. After immobilization, strengthening and stretching exercises may be necessary to regain strength and a full range of motion. Surgery is rarely needed to treat ankle sprains. MEDICATION   Nonsteroidal anti-inflammatory medicines, such as aspirin and ibuprofen (do not take for the first 3 days after injury or within 7 days before surgery), or other minor pain relievers, such as acetaminophen, are often recommended. Take these as directed by your caregiver. Contact your caregiver immediately if any bleeding, stomach upset, or signs of an allergic reaction occur from these medicines.  Ointments applied to the skin may be helpful.  Pain relievers may be prescribed as necessary by your caregiver. Do not take prescription pain medicine for longer than 4 to 7 days. Use only as directed and only as much as you need. HEAT AND COLD  Cold treatment (icing) is used to relieve pain and reduce inflammation for acute and chronic cases. Cold should be applied for 10 to 15 minutes every 2 to 3 hours for inflammation and pain and immediately after any activity that aggravates your symptoms. Use ice packs or an ice massage.  Heat treatment may be used before performing stretching and strengthening activities prescribed by your caregiver. Use a heat pack or a warm soak. SEEK IMMEDIATE MEDICAL CARE IF:   Pain, swelling, or bruising worsens despite treatment.  You experience pain, numbness, discoloration, or coldness in the foot or toes.  New, unexplained symptoms develop. (Drugs used in treatment may produce side effects.) EXERCISES  PHASE II EXERCISES RANGE OF MOTION (ROM) AND STRETCHING EXERCISES - Ankle Sprain, Acute-Phase II, Weeks 3 to 4 After your physician, physical  therapist, or athletic trainer feels your knee has made progress significant enough to begin more advanced exercises, he or she may recommend completing some of the following exercises. Although each person heals at different rates, most people will be ready for these exercises between 3 and 4 weeks after their injury. Do not begin these exercises until you have your caregiver's permission. He or she may also advise you to continue with the exercises which you completed in Phase I of your rehabilitation. While completing these exercises, remember:   Restoring tissue flexibility helps normal motion to return to the joints. This allows healthier, less painful movement and activity.  An effective stretch should be held for at least 30 seconds.  A stretch should never be painful. You should only feel a gentle lengthening or release in the stretched tissue. RANGE OF MOTION - Ankle Plantar Flexion   Sit with your right / left leg crossed over your opposite knee.  Use your opposite hand to pull the top of your foot and toes toward you.  You should feel a gentle stretch on the top of your foot/ankle. Hold this position for __________. Repeat __________ times. Complete __________ times per day.  RANGE OF MOTION - Ankle Eversion  Sit with your right / left ankle crossed over your opposite knee.    Grip your foot with your opposite hand, placing your thumb on the top of your foot and your fingers across the bottom of your foot.  Gently push your foot downward with a slight rotation so your littlest toes rise slightly  You should feel a gentle stretch on the inside of your ankle. Hold the stretch for __________ seconds. Repeat __________ times. Complete this exercise __________ times per day.  RANGE OF MOTION - Ankle Inversion  Sit with your right / left ankle crossed over your opposite knee.  Grip your foot with your opposite hand, placing your thumb on the bottom of your foot and your fingers across  the top of your foot.  Gently pull your foot so the smallest toe comes toward you and your thumb pushes the inside of the ball of your foot away from you.  You should feel a gentle stretch on the outside of your ankle. Hold the stretch for __________ seconds. Repeat __________ times. Complete this exercise __________ times per day.  STRETCH - Gastrocsoleus  Sit with your right / left leg extended. Holding onto both ends of a belt or towel, loop it around the ball of your foot.  Keeping your right / left ankle and foot relaxed and your knee straight, pull your foot and ankle toward you using the belt/towel.  You should feel a gentle stretch behind your calf or knee. Hold this position for __________ seconds. Repeat __________ times. Complete this stretch __________ times per day.  RANGE OF MOTION - Ankle Dorsiflexion, Active Assisted  Remove shoes and sit on a chair that is preferably not on a carpeted surface.  Place right / left foot under knee. Extend your opposite leg for support.  Keeping your heel down, slide your right / left foot back toward the chair until you feel a stretch at your ankle or calf. If you do not feel a stretch, slide your bottom forward to the edge of the chair while still keeping your heel down.  Hold this stretch for __________ seconds. Repeat __________ times. Complete this stretch __________ times per day.  STRETCH - Gastroc, Standing   Place hands on wall.  Extend right / left leg and place a folded washcloth under the arch of your foot for support. Keep the front knee somewhat bent.  Slightly point your toes inward on your back foot.  Keeping your right / left heel on the floor and your knee straight, shift your weight toward the wall, not allowing your back to arch.  You should feel a gentle stretch in the calf. Hold this position for __________ seconds. Repeat __________ times. Complete this stretch __________ times per day. STRETCH - Soleus,  Standing  Place hands on wall.  Extend right / left leg and place a folded washcloth under the arch of your foot for support. Keep the front knee somewhat bent.  Slightly point your toes inward on your back foot.  Keep your right / left heel on the floor, bend your back knee, and slightly shift your weight over the back leg so that you feel a gentle stretch deep in your back calf.  Hold this position for __________ seconds. Repeat __________ times. Complete this stretch __________ times per day. STRETCH - Gastrocsoleus, Standing Note: This exercise can place a lot of stress on your foot and ankle. Please complete this exercise only if specifically instructed by your caregiver.   Place the ball of your right / left foot on a step, keeping your other   foot firmly on the same step.  Hold on to the wall or a rail for balance.  Slowly lift your other foot, allowing your body weight to press your heel down over the edge of the step.  You should feel a stretch in your right / left calf.  Hold this position for __________ seconds.  Repeat this exercise with a slight bend in your knee. Repeat __________ times. Complete this stretch __________ times per day.  STRENGTHENING EXERCISES - Ankle Sprain, Acute-Phase II Around 3 to 4 weeks after your injury, you may progress to some of these exercises in your rehabilitation program. Do not begin these until you have your caregiver's permission. Although your condition has improved, the Phase I exercises will continue to be helpful and you may continue to complete them. As you complete strengthening exercises, remember:   Strong muscles with good endurance tolerate stress better.  Do the exercises as initially prescribed by your caregiver. Progress slowly with each exercise, gradually increasing the number of repetitions and weight used under his or her guidance.  You may experience muscle soreness or fatigue, but the pain or discomfort you are trying  to eliminate should never worsen during these exercises. If this pain does worsen, stop and make certain you are following the directions exactly. If the pain is still present after adjustments, discontinue the exercise until you can discuss the trouble with your caregiver. STRENGTH - Plantar-flexors, Standing  Stand with your feet shoulder width apart. Steady yourself with a wall or table using as little support as needed.  Keeping your weight evenly spread over the width of your feet, rise up on your toes.*  Hold this position for __________ seconds. Repeat __________ times. Complete this exercise __________ times per day.  *If this is too easy, shift your weight toward your right / left leg until you feel challenged. Ultimately, you may be asked to do this exercise with your right / left foot only. STRENGTH - Dorsiflexors and Plantar-flexors, Heel/toe Walking  Dorsiflexion: Walk on your heels only. Keep your toes as high as possible.  Walk for ____________________ seconds/feet.  Repeat __________ times. Complete __________ times per day.  Plantar flexion: Walk on your toes only. Keep your heels as high as possible.  Walk for ____________________ seconds/feet. Repeat __________ times. Complete __________ times per day.  BALANCE - Tandem Walking  Place your uninjured foot on a line 2 to 4 inches wide and at least 10 feet long.  Keeping your balance without using anything for extra support, place your right / left heel directly in front of your other foot.  Slowly raise your back foot up, lifting from the heel to the toes, and place it directly in front of the right / left foot.  Continue to walk along the line slowly. Walk for ____________________ feet. Repeat ____________________ times. Complete ____________________ times per day. BALANCE - Inversion/Eversion Use caution, these are advanced level exercises. Do not begin them until you are advised to do so.   Create a balance  board using a sturdy board about 1  feet long and at 1 to 1  feet wide and a 1  inch diameter rod or pipe that is as long as the board's width. A copper pipe or a solid broomstick work well.  Stand on a non-carpeted surface near a countertop or wall. Step onto the board so that your feet are hip-width apart and equally straddle the rod/pipe.  Keeping your feet in place, complete these two exercises   without shifting your upper body or hips:  Tip the board from side-to-side. Control the movement so the board does not forcefully strike the ground. The board should silently tap the ground.  Tip the board side-to-side without striking the ground. Occasionally pause and maintain a steady position at various points.  Repeat the first two exercises, but use only your right / left foot. Place your right / left foot directly over the rod/pipe. Repeat __________ times. Complete this exercise __________ times a day. BALANCE - Plantar/Dorsi Flexion Use caution, these are advanced level exercises. Do not begin them until you are advised to do so.   Create a balance board using a sturdy board about 1  feet long and at 1 to 1  feet wide and a 1  inch diameter rod or pipe that is as long as the board's width. A copper pipe or a solid broomstick work well.  Stand on a non-carpeted surface near a countertop or wall. Stand on the board so that the rod/pipe runs under the arches in your feet.  Keeping your feet in place, complete these two exercises without shifting your upper body or hips:  Tip the board from side-to-side. Control the movement so the board does not forcefully strike the ground. The board should silently tap the ground.  Tip the board side-to-side without striking the ground. Occasionally pause and maintain a steady position at various points.  Repeat the first two exercises, but use only your right / left foot. Stand in the center of the board. Repeat __________ times. Complete this  exercise __________ times a day. STRENGTH - Plantar-flexors, Eccentric Note: This exercise can place a lot of stress on your foot and ankle. Please complete this exercise only if specifically instructed by your caregiver.   Place the balls of your feet on a step. With your hands, use only enough support from a wall or rail to keep your balance.  Keep your knees straight and rise up on your toes.  Slowly shift your weight entirely to your toes and pick up your opposite foot. Gently and with controlled movement, lower your weight through your right / left foot so that your heel drops below the level of the step. You will feel a slight stretch in the back of your calf at the ending position.  Use the healthy leg to help rise up onto the balls of both feet, then lower weight only on the right / left leg again. Build up to 15 repetitions. Then progress to 3 consecutive sets of 15 repetitions.*  After completing the above exercise, complete the same exercise with a slight knee bend (about 30 degrees). Again, build up to 15 repetitions. Then progress to 3 consecutive sets of 15 repetitions.* Perform this exercise __________ times per day.  *When you easily complete 3 sets of 15, your physician, physical therapist, or athletic trainer may advise you to add resistance by wearing a backpack filled with additional weight. Document Released: 08/21/2005 Document Revised: 07/24/2011 Document Reviewed: 08/13/2008 [employer] Patient Information 2015 [employer]. This information is not intended to replace advice given to you by your health care provider. Make sure you discuss any questions you have with your health care provider.  

## 2014-02-13 NOTE — ED Notes (Signed)
Pt was at work tonight and twisted ankle, able to walk on it after injury but pain has increased through the night

## 2014-02-13 NOTE — ED Provider Notes (Signed)
CSN: [id number]     Arrival date & time 02/13/14  0602 History   First MD Initiated Contact with Patient 02/13/14 [phone]     Chief Complaint  Patient presents with  . Ankle Pain     (Consider location/radiation/quality/duration/timing/severity/associated sxs/prior Treatment) Patient is a 28 y.o. male presenting with ankle pain. The history is provided by the patient.  Ankle Pain Location:  Ankle Injury: yes   Mechanism of injury comment:  Twisted at work Ankle location:  R ankle Pain details:    Quality:  Aching   Radiates to:  Does not radiate   Severity:  Moderate   Onset quality:  Sudden   Timing:  Constant   Progression:  Unchanged Chronicity:  New Foreign body present:  No foreign bodies Relieved by:  Nothing Worsened by:  Nothing tried Ineffective treatments:  None tried Associated symptoms: no back pain   Risk factors: no concern for non-accidental trauma     Past Medical History  Diagnosis Date  . Hypertension   . Anxiety    Past Surgical History  Procedure Laterality Date  . Knee surgery     History reviewed. No pertinent family history. History  Substance Use Topics  . Smoking status: Light Tobacco Smoker    Types: Cigarettes  . Smokeless tobacco: Not on file  . Alcohol Use: Yes     Comment: occasionally    Review of Systems  Musculoskeletal: Negative for back pain.  All other systems reviewed and are negative.     Allergies  Lisinopril  Home Medications   Prior to Admission medications   Medication Sig Start Date End Date Taking? Authorizing Provider  amLODipine (NORVASC) 10 MG tablet Take 1 tablet (10 mg total) by mouth daily. 08/01/13  Yes Wendall Stade, MD  hydrochlorothiazide (HYDRODIURIL) 25 MG tablet Take 1 tablet (25 mg total) by mouth daily. 06/20/13  Yes Wendall Stade, MD  ALPRAZolam Prudy Feeler) 0.5 MG tablet Take 0.5 mg by mouth daily as needed for anxiety.    Historical Provider, MD  meloxicam (MOBIC) 7.5 MG tablet Take 1 tablet (7.5  mg total) by mouth daily. 12/16/13   Heike Pounds K Andrae Claunch-Rasch, MD  meloxicam (MOBIC) 7.5 MG tablet Take 1 tablet (7.5 mg total) by mouth daily. 01/27/14   Nasif Bos K Breydon Senters-Rasch, MD  meloxicam (MOBIC) 7.5 MG tablet Take 1 tablet (7.5 mg total) by mouth daily. 02/13/14   Violanda Bobeck K Braden Cimo-Rasch, MD  methocarbamol (ROBAXIN) 500 MG tablet Take 1 tablet (500 mg total) by mouth 2 (two) times daily. 12/16/13   Michale Weikel K Kayte Borchard-Rasch, MD  methocarbamol (ROBAXIN) 500 MG tablet Take 1 tablet (500 mg total) by mouth 2 (two) times daily. 01/27/14   Marselino Slayton K Anahla Bevis-Rasch, MD  omeprazole (PRILOSEC) 20 MG capsule Take 1 capsule (20 mg total) by mouth daily. 01/27/14   Lakrista Scaduto K Josslin Sanjuan-Rasch, MD  penicillin v potassium (VEETID) 500 MG tablet Take 1 tablet (500 mg total) by mouth 4 (four) times daily. 11/12/13   Mora Bellman, PA-C  traMADol (ULTRAM) 50 MG tablet Take 1 tablet (50 mg total) by mouth every 6 (six) hours as needed. 11/12/13   Mora Bellman, PA-C  traMADol (ULTRAM) 50 MG tablet Take 1 tablet (50 mg total) by mouth every 6 (six) hours as needed. 02/13/14   Gilda Abboud K Kasiyah Platter-Rasch, MD   BP 158/104  Pulse 100  Temp(Src) 99 F (37.2 C) (Oral)  Resp 16  Ht 5\' 5"  (1.651 m)  Wt 190 lb (86.183 kg)  BMI 31.62 kg/m2  SpO2 100% Physical Exam  Constitutional: He is oriented to person, place, and time. He appears well-developed and well-nourished. No distress.  HENT:  Head: Normocephalic and atraumatic.  Mouth/Throat: Oropharynx is clear and moist.  Eyes: Conjunctivae are normal. Pupils are equal, round, and reactive to light.  Neck: Normal range of motion. Neck supple.  Cardiovascular: Normal rate, regular rhythm and intact distal pulses.   Pulmonary/Chest: Effort normal and breath sounds normal. He has no wheezes. He has no rales.  Abdominal: Soft. Bowel sounds are normal. There is no tenderness.  Musculoskeletal: Normal range of motion.       Right ankle: Normal. Achilles tendon normal.  Neurological: He is alert  and oriented to person, place, and time. He has normal reflexes.  Skin: Skin is warm and dry.  Psychiatric: He has a normal mood and affect.    ED Course  Procedures (including critical care time) Labs Review Labs Reviewed - No data to display  Imaging Review Dg Ankle Complete Right  02/13/2014   CLINICAL DATA:  Right ankle pain.  Twisting injury at work.  EXAM: RIGHT ANKLE - COMPLETE 3+ VIEW  COMPARISON:  Right foot 06/27/2013  FINDINGS: There is no evidence of fracture, dislocation, or joint effusion. There is no evidence of arthropathy or other focal bone abnormality. Soft tissues are unremarkable.  IMPRESSION: Negative.   Electronically Signed   By: Burman NievesWilliam  Stevens M.D.   On: 02/13/2014 06:23     EKG Interpretation None      MDM   Final diagnoses:  Ankle sprain, right, initial encounter    R.I.C.E. Therapy and pain medication ASO places    Ari Bernabei K Ceejay Kegley-Rasch, MD 02/13/14 [phone]

## 2014-03-16 ENCOUNTER — Emergency Department (HOSPITAL_BASED_OUTPATIENT_CLINIC_OR_DEPARTMENT_OTHER)
Admission: EM | Admit: 2014-03-16 | Discharge: 2014-03-16 | Disposition: A | Discharge status: Home / Self Care | Payer: Managed Care, Other (non HMO) | Attending: Emergency Medicine | Admitting: Emergency Medicine

## 2014-03-16 ENCOUNTER — Encounter (HOSPITAL_BASED_OUTPATIENT_CLINIC_OR_DEPARTMENT_OTHER): Payer: Self-pay

## 2014-03-16 DIAGNOSIS — Z79899 Other long term (current) drug therapy: Secondary | ICD-10-CM | POA: Diagnosis not present

## 2014-03-16 DIAGNOSIS — Z791 Long term (current) use of non-steroidal anti-inflammatories (NSAID): Secondary | ICD-10-CM | POA: Insufficient documentation

## 2014-03-16 DIAGNOSIS — Z792 Long term (current) use of antibiotics: Secondary | ICD-10-CM | POA: Diagnosis not present

## 2014-03-16 DIAGNOSIS — F419 Anxiety disorder, unspecified: Secondary | ICD-10-CM | POA: Diagnosis not present

## 2014-03-16 DIAGNOSIS — Z87891 Personal history of nicotine dependence: Secondary | ICD-10-CM | POA: Insufficient documentation

## 2014-03-16 DIAGNOSIS — R0789 Other chest pain: Secondary | ICD-10-CM | POA: Insufficient documentation

## 2014-03-16 DIAGNOSIS — J209 Acute bronchitis, unspecified: Secondary | ICD-10-CM | POA: Insufficient documentation

## 2014-03-16 DIAGNOSIS — I10 Essential (primary) hypertension: Secondary | ICD-10-CM | POA: Diagnosis not present

## 2014-03-16 DIAGNOSIS — M791 Myalgia: Secondary | ICD-10-CM | POA: Diagnosis present

## 2014-03-16 LAB — RAPID STREP SCREEN (MED CTR MEBANE ONLY): Streptococcus, Group A Screen (Direct): NEGATIVE

## 2014-03-16 MED ORDER — AZITHROMYCIN 250 MG PO TABS: 250.0000 mg | ORAL_TABLET | Freq: Every day | ORAL | Status: DC

## 2014-03-16 MED ORDER — HYDROCOD POLST-CHLORPHEN POLST 10-8 MG/5ML PO LQCR: 5.0000 mL | Freq: Two times a day (BID) | ORAL | Status: DC | PRN

## 2014-03-16 NOTE — ED Provider Notes (Signed)
CSN: [id number]     Arrival date & time 03/16/14  1830 History   First MD Initiated Contact with Patient 03/16/14 1849     Chief Complaint  Patient presents with  . Generalized Body Aches     (Consider location/radiation/quality/duration/timing/severity/associated sxs/prior Treatment) Patient is a 28 y.o. male presenting with URI. The history is provided by the patient.  URI Presenting symptoms: congestion, cough, facial pain, fever, rhinorrhea and sore throat   Presenting symptoms: no ear pain   Severity:  Moderate Onset quality:  Gradual Duration:  2 days Timing:  Constant Progression:  Worsening Chronicity:  New Relieved by:  Nothing Worsened by:  Nothing tried Ineffective treatments:  Decongestant and OTC medications Associated symptoms: myalgias and sinus pain   Associated symptoms: no headaches and no wheezing   Risk factors: sick contacts    Nathan Nelson is a 28 y.o. male who presents to the ED with generalized body aches, sore throat and cough x 2 days. Patient states he has been around his niece who has bronchitis and similar symptoms.  Past Medical History  Diagnosis Date  . Hypertension   . Anxiety    Past Surgical History  Procedure Laterality Date  . Knee surgery     No family history on file. History  Substance Use Topics  . Smoking status: Former Smoker    Quit date: 03/02/2014  . Smokeless tobacco: Not on file  . Alcohol Use: Yes     Comment: occasionally    Review of Systems  Constitutional: Positive for fever and chills.  HENT: Positive for congestion, rhinorrhea and sore throat. Negative for ear pain, mouth sores and trouble swallowing.   Eyes: Negative for redness, itching and visual disturbance.  Respiratory: Positive for cough and chest tightness. Negative for shortness of breath and wheezing.   Cardiovascular: Negative for chest pain and leg swelling.  Gastrointestinal: Negative for nausea, vomiting and abdominal pain.  Genitourinary:  Negative for dysuria, urgency, frequency and decreased urine volume.  Musculoskeletal: Positive for myalgias.  Skin: Negative for rash.  Neurological: Negative for dizziness, light-headedness and headaches.  Psychiatric/Behavioral: Negative for confusion. The patient is not nervous/anxious.       Allergies  Lisinopril  Home Medications   Prior to Admission medications   Medication Sig Start Date End Date Taking? Authorizing Provider  ALPRAZolam Prudy Feeler) 0.5 MG tablet Take 0.5 mg by mouth daily as needed for anxiety.    Historical Provider, MD  amLODipine (NORVASC) 10 MG tablet Take 1 tablet (10 mg total) by mouth daily. 08/01/13   Wendall Stade, MD  hydrochlorothiazide (HYDRODIURIL) 25 MG tablet Take 1 tablet (25 mg total) by mouth daily. 06/20/13   Wendall Stade, MD  meloxicam (MOBIC) 7.5 MG tablet Take 1 tablet (7.5 mg total) by mouth daily. 12/16/13   April K Palumbo-Rasch, MD  meloxicam (MOBIC) 7.5 MG tablet Take 1 tablet (7.5 mg total) by mouth daily. 01/27/14   April K Palumbo-Rasch, MD  meloxicam (MOBIC) 7.5 MG tablet Take 1 tablet (7.5 mg total) by mouth daily. 02/13/14   April K Palumbo-Rasch, MD  methocarbamol (ROBAXIN) 500 MG tablet Take 1 tablet (500 mg total) by mouth 2 (two) times daily. 12/16/13   April K Palumbo-Rasch, MD  methocarbamol (ROBAXIN) 500 MG tablet Take 1 tablet (500 mg total) by mouth 2 (two) times daily. 01/27/14   April K Palumbo-Rasch, MD  omeprazole (PRILOSEC) 20 MG capsule Take 1 capsule (20 mg total) by mouth daily. 01/27/14   April  Smitty CordsK Palumbo-Rasch, MD  penicillin v potassium (VEETID) 500 MG tablet Take 1 tablet (500 mg total) by mouth 4 (four) times daily. 11/12/13   Mora BellmanHannah S Merrell, PA-C  traMADol (ULTRAM) 50 MG tablet Take 1 tablet (50 mg total) by mouth every 6 (six) hours as needed. 11/12/13   Mora BellmanHannah S Merrell, PA-C  traMADol (ULTRAM) 50 MG tablet Take 1 tablet (50 mg total) by mouth every 6 (six) hours as needed. 02/13/14   April K Palumbo-Rasch, MD   BP 141/91  mmHg  Pulse 90  Temp(Src) 98.2 F (36.8 C) (Oral)  Resp 18  Ht 5\' 7"  (1.702 m)  Wt 190 lb (86.183 kg)  BMI 29.75 kg/m2  SpO2 99% Physical Exam  Constitutional: He is oriented to person, place, and time. He appears well-developed and well-nourished.  HENT:  Head: Normocephalic and atraumatic.  Right Ear: Tympanic membrane normal.  Left Ear: Tympanic membrane normal.  Nose: Mucosal edema and rhinorrhea present.  Mouth/Throat: Uvula is midline and mucous membranes are normal. Posterior oropharyngeal erythema present.  Eyes: EOM are normal.  Neck: Neck supple.  Cardiovascular: Normal rate, regular rhythm and normal heart sounds.   Pulmonary/Chest: Effort normal. He has no wheezes. Rhonchi: occasional. He has no rales.  Abdominal: Soft. Bowel sounds are normal. There is no tenderness.  Musculoskeletal: Normal range of motion.  Neurological: He is alert and oriented to person, place, and time. No cranial nerve deficit.  Skin: Skin is warm and dry.  Psychiatric: He has a normal mood and affect. His behavior is normal.  Nursing note and vitals reviewed.   ED Course  Procedures  Rapid strep negative  MDM  28 y.o. male with cough, cold, congestion and body aches. Will treat for bronchitis. Discussed with the patient clinical and lab findings and plan of care and he voices understanding. All questioned fully answered. He will return if any problems arise.    Medication List    STOP taking these medications        meloxicam 7.5 MG tablet  Commonly known as:  MOBIC     methocarbamol 500 MG tablet  Commonly known as:  ROBAXIN     penicillin v potassium 500 MG tablet  Commonly known as:  VEETID      TAKE these medications        azithromycin 250 MG tablet  Commonly known as:  ZITHROMAX  Take 1 tablet (250 mg total) by mouth daily. Take first 2 tablets together, then 1 every day until finished.     chlorpheniramine-HYDROcodone 10-8 MG/5ML Lqcr  Commonly known as:  TUSSIONEX  PENNKINETIC ER  Take 5 mLs by mouth every 12 (twelve) hours as needed for cough.     omeprazole 20 MG capsule  Commonly known as:  PRILOSEC  Take 1 capsule (20 mg total) by mouth daily.     traMADol 50 MG tablet  Commonly known as:  ULTRAM  Take 1 tablet (50 mg total) by mouth every 6 (six) hours as needed.      ASK your doctor about these medications        ALPRAZolam 0.5 MG tablet  Commonly known as:  XANAX  Take 0.5 mg by mouth daily as needed for anxiety.     amLODipine 10 MG tablet  Commonly known as:  NORVASC  Take 1 tablet (10 mg total) by mouth daily.     hydrochlorothiazide 25 MG tablet  Commonly known as:  HYDRODIURIL  Take 1 tablet (25 mg total) by  mouth daily.           [address]Hope SadorusM Neese, NP 03/20/14 0004  Gilda Creasehristopher J. Pollina, MD 03/20/14 [phone]

## 2014-03-16 NOTE — ED Notes (Signed)
Pt c/o body aches, cough, congestion runny nose x 2 days

## 2014-03-16 NOTE — ED Notes (Signed)
C/o body aches, cough, sore throat x 2 days

## 2014-03-18 LAB — CULTURE, GROUP A STREP

## 2014-06-07 ENCOUNTER — Emergency Department (HOSPITAL_BASED_OUTPATIENT_CLINIC_OR_DEPARTMENT_OTHER)
Admission: EM | Admit: 2014-06-07 | Discharge: 2014-06-07 | Disposition: A | Discharge status: Home / Self Care | Payer: Managed Care, Other (non HMO) | Attending: Emergency Medicine | Admitting: Emergency Medicine

## 2014-06-07 ENCOUNTER — Encounter (HOSPITAL_BASED_OUTPATIENT_CLINIC_OR_DEPARTMENT_OTHER): Payer: Self-pay | Admitting: *Deleted

## 2014-06-07 ENCOUNTER — Emergency Department (HOSPITAL_BASED_OUTPATIENT_CLINIC_OR_DEPARTMENT_OTHER): Payer: Managed Care, Other (non HMO)

## 2014-06-07 DIAGNOSIS — F419 Anxiety disorder, unspecified: Secondary | ICD-10-CM | POA: Insufficient documentation

## 2014-06-07 DIAGNOSIS — R0789 Other chest pain: Secondary | ICD-10-CM | POA: Diagnosis not present

## 2014-06-07 DIAGNOSIS — Z79899 Other long term (current) drug therapy: Secondary | ICD-10-CM | POA: Insufficient documentation

## 2014-06-07 DIAGNOSIS — Z87891 Personal history of nicotine dependence: Secondary | ICD-10-CM | POA: Insufficient documentation

## 2014-06-07 DIAGNOSIS — I10 Essential (primary) hypertension: Secondary | ICD-10-CM | POA: Insufficient documentation

## 2014-06-07 DIAGNOSIS — R1013 Epigastric pain: Secondary | ICD-10-CM | POA: Diagnosis present

## 2014-06-07 DIAGNOSIS — Z792 Long term (current) use of antibiotics: Secondary | ICD-10-CM | POA: Diagnosis not present

## 2014-06-07 MED ORDER — NAPROXEN 250 MG PO TABS
500.0000 mg | ORAL_TABLET | Freq: Once | ORAL | Status: AC
  Administered 2014-06-07: 500 mg via ORAL
  Filled 2014-06-07: qty 2

## 2014-06-07 NOTE — ED Notes (Signed)
C/o right shoulder and arm pain with epigastric soreness. States he does a lot of lifting at work. Also soreness is in back as well. Pain is worse with movement and deep breaths. No other sx. Onset 1 week ago.

## 2014-06-07 NOTE — ED Provider Notes (Signed)
CSN: [id number]     Arrival date & time 06/07/14  1204 History   First MD Initiated Contact with Patient 06/07/14 1223     Chief Complaint  Patient presents with  . epigastric pain      (Consider location/radiation/quality/duration/timing/severity/associated sxs/prior Treatment) HPI Comments: 29 year old male with no significant medical history several high blood pressure, no current smoking presents with right epigastric lower chest wall tenderness worse with movement and palpation. Patient constantly lifting at work. No specific injury called. No shortness of breath, no recent surgeries, no leg swelling, no blood clot history, no hemoptysis. Symptoms have been constant for about 1 week. No specific exertional symptoms or worsening..  The history is provided by the patient.    Past Medical History  Diagnosis Date  . Hypertension   . Anxiety    Past Surgical History  Procedure Laterality Date  . Knee surgery     No family history on file. History  Substance Use Topics  . Smoking status: Former Smoker    Quit date: 03/02/2014  . Smokeless tobacco: Not on file  . Alcohol Use: Yes     Comment: occasionally    Review of Systems  Constitutional: Negative for fever and chills.  HENT: Negative for congestion.   Eyes: Negative for visual disturbance.  Respiratory: Negative for shortness of breath.   Cardiovascular: Negative for chest pain.  Gastrointestinal: Negative for vomiting and abdominal pain.  Genitourinary: Negative for dysuria and flank pain.  Musculoskeletal: Negative for back pain, neck pain and neck stiffness.  Skin: Negative for rash.  Neurological: Negative for light-headedness and headaches.      Allergies  Lisinopril  Home Medications   Prior to Admission medications   Medication Sig Start Date End Date Taking? Authorizing Provider  ALPRAZolam Prudy Feeler(XANAX) 0.5 MG tablet Take 0.5 mg by mouth daily as needed for anxiety.   Yes Historical Provider, MD   amLODipine (NORVASC) 10 MG tablet Take 1 tablet (10 mg total) by mouth daily. 08/01/13  Yes Wendall StadePeter C Nishan, MD  hydrochlorothiazide (HYDRODIURIL) 25 MG tablet Take 1 tablet (25 mg total) by mouth daily. 06/20/13  Yes Wendall StadePeter C Nishan, MD  azithromycin (ZITHROMAX) 250 MG tablet Take 1 tablet (250 mg total) by mouth daily. Take first 2 tablets together, then 1 every day until finished. 03/16/14   Hope Orlene OchM Neese, NP  chlorpheniramine-HYDROcodone (TUSSIONEX PENNKINETIC ER) 10-8 MG/5ML LQCR Take 5 mLs by mouth every 12 (twelve) hours as needed for cough. 03/16/14   Hope Orlene OchM Neese, NP  omeprazole (PRILOSEC) 20 MG capsule Take 1 capsule (20 mg total) by mouth daily. 01/27/14   April K Palumbo-Rasch, MD  traMADol (ULTRAM) 50 MG tablet Take 1 tablet (50 mg total) by mouth every 6 (six) hours as needed. 02/13/14   April K Palumbo-Rasch, MD   BP 143/87 mmHg  Pulse 73  Temp(Src) 98 F (36.7 C) (Oral)  Resp 18  Ht 5\' 6"  (1.676 m)  Wt 190 lb (86.183 kg)  BMI 30.68 kg/m2  SpO2 100% Physical Exam  Constitutional: He is oriented to person, place, and time. He appears well-developed and well-nourished.  HENT:  Head: Normocephalic and atraumatic.  Eyes: Conjunctivae are normal. Right eye exhibits no discharge. Left eye exhibits no discharge.  Neck: Normal range of motion. Neck supple. No tracheal deviation present.  Cardiovascular: Normal rate and regular rhythm.   Pulmonary/Chest: Effort normal and breath sounds normal.  Abdominal: Soft. He exhibits no distension. There is no tenderness. There is no guarding.  Musculoskeletal: He exhibits tenderness. He exhibits no edema.  Patient has significant tenderness to palpation right anterior lower ribs, no wound or signs of cellulitis.  Neurological: He is alert and oriented to person, place, and time.  Skin: Skin is warm. No rash noted.  Psychiatric: He has a normal mood and affect.  Nursing note and vitals reviewed.   ED Course  Procedures (including critical care  time) Labs Review Labs Reviewed - No data to display  Imaging Review Dg Chest 2 View  06/07/2014   CLINICAL DATA:  Cough, congestion and anterior chest wall pain. History of hypertension. Initial encounter.  EXAM: CHEST  2 VIEW  COMPARISON:  Radiographs 01/27/2014 and 12/15/2013.  FINDINGS: The heart size and mediastinal contours are normal. The lungs are clear. There is no pleural effusion or pneumothorax. No acute osseous findings are identified.  IMPRESSION: Stable examination.  No active cardiopulmonary process.   Electronically Signed   By: Roxy Horseman M.D.   On: 06/07/2014 13:33     EKG Interpretation   Date/Time:  Sunday June 07 2014 12:15:16 EST Ventricular Rate:  63 PR Interval:  140 QRS Duration: 92 QT Interval:  366 QTC Calculation: 374 R Axis:   95 Text Interpretation:  Normal sinus rhythm with sinus arrhythmia Rightward  axis Borderline ECG similar to previous Confirmed by Coryn Mosso  MD, Zamiah Tollett  ([id number]) on 06/07/2014 12:22:13 PM      MDM   Final diagnoses:  Anterior chest wall pain   Patient with clinically musculoskeletal chest wall pain. EKG done in triage similar previous. Chest x-ray pending and discussed outpatient follow-up  CXR reviewed, no acute findings Results and differential diagnosis were discussed with the patient/parent/guardian. Close follow up outpatient was discussed, comfortable with the plan.   Medications  naproxen (NAPROSYN) tablet 500 mg (500 mg Oral Given 06/07/14 1338)    Filed Vitals:   06/07/14 1209  BP: 143/87  Pulse: 73  Temp: 98 F (36.7 C)  TempSrc: Oral  Resp: 18  Height:  (1.676 m)  Weight: 190 lb (86.183 kg)  SpO2: 100%    Final diagnoses:  Anterior chest wall pain       Enid Skeens, MD 06/07/14 1356

## 2014-06-07 NOTE — Discharge Instructions (Signed)
Take ibuprofen and tylenol for pain. If you were given medicines take as directed.  If you are on coumadin or contraceptives realize their levels and effectiveness is altered by many different medicines.  If you have any reaction (rash, tongues swelling, other) to the medicines stop taking and see a physician.   Please follow up as directed and return to the ER or see a physician for new or worsening symptoms.  Thank you. Filed Vitals:   06/07/14 1209  BP: 143/87  Pulse: 73  Temp: 98 F (36.7 C)  TempSrc: Oral  Resp: 18  Height: 5\' 6"  (1.676 m)  Weight: 190 lb (86.183 kg)  SpO2: 100%

## 2014-06-09 ENCOUNTER — Ambulatory Visit (INDEPENDENT_AMBULATORY_CARE_PROVIDER_SITE_OTHER): Payer: Managed Care, Other (non HMO) | Admitting: Family Medicine

## 2014-06-09 ENCOUNTER — Encounter: Payer: Self-pay | Admitting: Family Medicine

## 2014-06-09 VITALS — BP 155/96 | HR 79 | Ht 65.0 in | Wt 190.0 lb

## 2014-06-09 DIAGNOSIS — M940 Chondrocostal junction syndrome [Tietze]: Secondary | ICD-10-CM

## 2014-06-09 DIAGNOSIS — R0789 Other chest pain: Secondary | ICD-10-CM

## 2014-06-09 MED ORDER — HYDROCODONE-ACETAMINOPHEN 5-325 MG PO TABS: 1.0000 | ORAL_TABLET | Freq: Four times a day (QID) | ORAL | Status: DC | PRN

## 2014-06-09 MED ORDER — CYCLOBENZAPRINE HCL 10 MG PO TABS: 10.0000 mg | ORAL_TABLET | Freq: Three times a day (TID) | ORAL | Status: DC | PRN

## 2014-06-09 NOTE — Patient Instructions (Signed)
You have costochondritis (inflammation where the ribs come into the sternum/breastbone). Take voltaren twice a day with food for pain and inflammation. Icing as needed (though can use heat for over your shoulder and pectoral muscles if this feels better) 15 minutes at a time. Flexeril as needed for spasms. Norco as needed for pain (no driving on this medication). Ok to continue to work with this condition. Follow up with me in 6 weeks or as needed.

## 2014-06-11 DIAGNOSIS — M940 Chondrocostal junction syndrome [Tietze]: Secondary | ICD-10-CM | POA: Insufficient documentation

## 2014-06-11 HISTORY — DX: Chondrocostal junction syndrome (tietze): M94.0

## 2014-06-11 NOTE — Assessment & Plan Note (Signed)
Reassured patient.  CXR normal.  Voltaren regularly with icing.  Flexeril for secondary spasms.  Norco for severe pain.  Ok to work with this condition - advised not to work on [employer] however.  F/u in 6 weeks or prn.

## 2014-06-11 NOTE — Progress Notes (Signed)
PCP: Johny BlamerHARRIS, WILLIAM, MD  Subjective:   HPI: Patient is a 29 y.o. male here for rib,right chest pain.  Patient denies known injury. States for past 3 weeks he's had pain in breast bone radiating to right shoulder. 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 Taking ibuprofen as needed. No prior issues with ribs, chest. Hurts to take a deep breath. Chest x-rays were negative.  Past Medical History  Diagnosis Date  . Hypertension   . Anxiety     Current Outpatient Prescriptions on File Prior to Visit  Medication Sig Dispense Refill  . ALPRAZolam (XANAX) 0.5 MG tablet Take 0.5 mg by mouth daily as needed for anxiety.    Marland Kitchen. amLODipine (NORVASC) 10 MG tablet Take 1 tablet (10 mg total) by mouth daily. 30 tablet 0  . hydrochlorothiazide (HYDRODIURIL) 25 MG tablet Take 1 tablet (25 mg total) by mouth daily. 30 tablet 1  . omeprazole (PRILOSEC) 20 MG capsule Take 1 capsule (20 mg total) by mouth daily. 30 capsule 0   No current facility-administered medications on file prior to visit.    Past Surgical History  Procedure Laterality Date  . Knee surgery      Allergies  Allergen Reactions  . Lisinopril Other (See Comments)    Tightness in chest    History   Social History  . Marital Status: Legally Separated    Spouse Name: N/A    Number of Children: N/A  . Years of Education: N/A   Occupational History  . Not on file.   Social History Main Topics  . Smoking status: Former Smoker    Quit date: 03/02/2014  . Smokeless tobacco: Not on file  . Alcohol Use: 0.0 oz/week    0 Not specified per week     Comment: occasionally  . Drug Use: No  . Sexual Activity: Not on file   Other Topics Concern  . Not on file   Social History Narrative    No family history on file.  BP 155/96 mmHg  Pulse 79  Ht 5\' 5"  (1.651 m)  Wt 190 lb (86.183 kg)  BMI 31.62 kg/m2  Review of Systems: See HPI above.    Objective:  Physical Exam:  Gen:  NAD  CV:  RRR no MRG Lungs: CTAB, no wheezes rales or rhonchi Chest:  No gross deformity, swelling, bruising.  Tender right > left sternocostal regions.  Less tenderness right pectoral musculature.  No stepoffs of ribs. FROM shoulder    Assessment & Plan:  1. Costochondritis - Reassured patient.  CXR normal.  Voltaren regularly with icing.  Flexeril for secondary spasms.  Norco for severe pain.  Ok to work with this condition - advised not to work on [employer] however.  F/u in 6 weeks or prn.

## 2014-06-18 ENCOUNTER — Telehealth: Payer: Self-pay | Admitting: Family Medicine

## 2014-06-18 MED ORDER — DICLOFENAC SODIUM 75 MG PO TBEC: 75.0000 mg | DELAYED_RELEASE_TABLET | Freq: Two times a day (BID) | ORAL | Status: DC

## 2014-06-18 NOTE — Telephone Encounter (Signed)
Ok I sent it in - it's called voltaren.  Thanks!

## 2014-07-20 ENCOUNTER — Emergency Department (HOSPITAL_BASED_OUTPATIENT_CLINIC_OR_DEPARTMENT_OTHER)
Admission: EM | Admit: 2014-07-20 | Discharge: 2014-07-20 | Disposition: A | Discharge status: Home / Self Care | Payer: Managed Care, Other (non HMO) | Attending: Emergency Medicine | Admitting: Emergency Medicine

## 2014-07-20 ENCOUNTER — Encounter (HOSPITAL_BASED_OUTPATIENT_CLINIC_OR_DEPARTMENT_OTHER): Payer: Self-pay | Admitting: *Deleted

## 2014-07-20 DIAGNOSIS — Z87891 Personal history of nicotine dependence: Secondary | ICD-10-CM | POA: Diagnosis not present

## 2014-07-20 DIAGNOSIS — Z79899 Other long term (current) drug therapy: Secondary | ICD-10-CM | POA: Diagnosis not present

## 2014-07-20 DIAGNOSIS — F419 Anxiety disorder, unspecified: Secondary | ICD-10-CM | POA: Diagnosis not present

## 2014-07-20 DIAGNOSIS — I10 Essential (primary) hypertension: Secondary | ICD-10-CM | POA: Diagnosis not present

## 2014-07-20 DIAGNOSIS — L0201 Cutaneous abscess of face: Secondary | ICD-10-CM | POA: Insufficient documentation

## 2014-07-20 DIAGNOSIS — R220 Localized swelling, mass and lump, head: Secondary | ICD-10-CM | POA: Diagnosis present

## 2014-07-20 MED ORDER — CEPHALEXIN 500 MG PO CAPS: 500.0000 mg | ORAL_CAPSULE | Freq: Four times a day (QID) | ORAL | Status: DC

## 2014-07-20 NOTE — ED Provider Notes (Addendum)
CSN: [id number]     Arrival date & time 07/20/14  1625 History  This chart was scribed for Nathan Nayobert Antonetta Clanton, MD by Tonye RoyaltyJoshua Chen, ED Scribe. This patient was seen in room MH04/MH04 and the patient's care was started at 5:04 PM.    Chief Complaint  Patient presents with  . Mass   The history is provided by the patient. No language interpreter was used.    HPI Comments: Orion ModestDevin T Nelson is a 29 y.o. male who presents to the Emergency Department complaining of mass on left eyebrow which has been been coming and going, present this time since 3 days ago. He denies pain or drainage.   Past Medical History  Diagnosis Date  . Hypertension   . Anxiety    Past Surgical History  Procedure Laterality Date  . Knee surgery     No family history on file. History  Substance Use Topics  . Smoking status: Former Smoker    Quit date: 03/02/2014  . Smokeless tobacco: Not on file  . Alcohol Use: No     Comment: occasionally    Review of Systems  Skin:       Cyst to left eyebrow  All other systems reviewed and are negative.     Allergies  Lisinopril  Home Medications   Prior to Admission medications   Medication Sig Start Date End Date Taking? Authorizing Provider  ALPRAZolam Prudy Feeler(XANAX) 0.5 MG tablet Take 0.5 mg by mouth daily as needed for anxiety.    Historical Provider, MD  amLODipine (NORVASC) 10 MG tablet Take 1 tablet (10 mg total) by mouth daily. 08/01/13   Wendall StadePeter C Nishan, MD  cephALEXin (KEFLEX) 500 MG capsule Take 1 capsule (500 mg total) by mouth 4 (four) times daily. 07/20/14   Nathan Nayobert Tegh Franek, MD  cyclobenzaprine (FLEXERIL) 10 MG tablet Take 1 tablet (10 mg total) by mouth every 8 (eight) hours as needed. 06/09/14   Lenda KelpShane R Hudnall, MD  diclofenac (VOLTAREN) 75 MG EC tablet Take 1 tablet (75 mg total) by mouth 2 (two) times daily. 06/18/14   Lenda KelpShane R Hudnall, MD  hydrochlorothiazide (HYDRODIURIL) 25 MG tablet Take 1 tablet (25 mg total) by mouth daily. 06/20/13   Wendall StadePeter C Nishan, MD   HYDROcodone-acetaminophen (NORCO) 5-325 MG per tablet Take 1 tablet by mouth every 6 (six) hours as needed for moderate pain. 06/09/14   Lenda KelpShane R Hudnall, MD  omeprazole (PRILOSEC) 20 MG capsule Take 1 capsule (20 mg total) by mouth daily. 01/27/14   April Palumbo, MD   BP 137/93 mmHg  Pulse 60  Temp(Src) 97.6 F (36.4 C) (Oral)  Resp 18  Ht 5\' 6"  (1.676 m)  Wt 185 lb (83.915 kg)  BMI 29.87 kg/m2  SpO2 100% Physical Exam  Constitutional: He is oriented to person, place, and time. He appears well-developed and well-nourished. No distress.  HENT:  Head: Normocephalic and atraumatic.  Eyes: Pupils are equal, round, and reactive to light.    Neck: Normal range of motion.  Pulmonary/Chest: No respiratory distress.  Abdominal: Normal appearance. He exhibits no distension.  Musculoskeletal: Normal range of motion.  Neurological: He is alert and oriented to person, place, and time. No cranial nerve deficit.  Skin: Skin is warm and dry. No rash noted.  Psychiatric: He has a normal mood and affect. His behavior is normal.  Nursing note and vitals reviewed.   ED Course  INCISION AND DRAINAGE Date/Time: 07/20/2014 5:18 PM Performed by: Nathan NayBEATON, Makell Cyr Authorized by: Nathan NayBEATON, Shamiya Demeritt Consent: Verbal  consent obtained. Written consent not obtained. Consent given by: patient Patient identity confirmed: verbally with patient Type: cyst Body area: head/neck Location details: face Patient sedated: no Needle gauge: 18 Complexity: simple Drainage: purulent Drainage amount: moderate Patient tolerance: Patient tolerated the procedure well with no immediate complications   (including critical care time)    DIAGNOSTIC STUDIES: Oxygen Saturation is 100% on room air, normal by my interpretation.    COORDINATION OF CARE: 5:06 PM Discussed treatment plan with patient at beside, including incision and drainage. The patient agrees with the plan and has no further questions at this time.   Labs  Review Labs Reviewed  BODY FLUID CULTURE       MDM   Final diagnoses:  Facial abscess   I personally performed the services described in this documentation, which was scribed in my presence. The recorded information has been reviewed and considered.     Nathan Nay, MD 07/20/14 1723

## 2014-07-20 NOTE — Discharge Instructions (Signed)

## 2014-07-20 NOTE — ED Notes (Signed)
Pt has a lump near his left eye that has been coming and going but came back 2-3 days ago and is persisting.

## 2014-07-23 LAB — BODY FLUID CULTURE: SPECIAL REQUESTS: NORMAL

## 2014-07-25 ENCOUNTER — Telehealth (HOSPITAL_BASED_OUTPATIENT_CLINIC_OR_DEPARTMENT_OTHER): Payer: Self-pay | Admitting: Emergency Medicine

## 2014-07-25 NOTE — Telephone Encounter (Signed)
Post ED Visit - Positive Culture Follow-up  Culture report reviewed by antimicrobial stewardship pharmacist: []  Wes Dulaney, Pharm.D., BCPS [x]  Celedonio MiyamotoJeremy Frens, Pharm.D., BCPS []  Georgina PillionElizabeth Martin, [address] BCPS []  Ridley ParkMinh Pham, [address]Ph[address]D., BCPS, AAHIVP []  Estella HuskMichelle Turner, Pharm.D., BCPS, AAHIVP []  Elder CyphersLorie Poole, [address]Ph[address]D., BCPS  Positive Body fluid culture Treated with Oxacillin, organism sensitive to the same and no further patient follow-up is required at this time.  Jiles HaroldGammons, [place] Chaney 07/25/2014, 4:21 PM

## 2014-09-08 ENCOUNTER — Other Ambulatory Visit: Payer: Self-pay

## 2014-09-08 ENCOUNTER — Encounter (HOSPITAL_BASED_OUTPATIENT_CLINIC_OR_DEPARTMENT_OTHER): Payer: Self-pay | Admitting: *Deleted

## 2014-09-08 ENCOUNTER — Emergency Department (HOSPITAL_BASED_OUTPATIENT_CLINIC_OR_DEPARTMENT_OTHER)
Admission: EM | Admit: 2014-09-08 | Discharge: 2014-09-08 | Disposition: A | Discharge status: Home / Self Care | Payer: Managed Care, Other (non HMO) | Attending: Emergency Medicine | Admitting: Emergency Medicine

## 2014-09-08 ENCOUNTER — Emergency Department (HOSPITAL_BASED_OUTPATIENT_CLINIC_OR_DEPARTMENT_OTHER): Payer: Managed Care, Other (non HMO)

## 2014-09-08 DIAGNOSIS — Z87891 Personal history of nicotine dependence: Secondary | ICD-10-CM | POA: Insufficient documentation

## 2014-09-08 DIAGNOSIS — R079 Chest pain, unspecified: Secondary | ICD-10-CM | POA: Diagnosis not present

## 2014-09-08 DIAGNOSIS — Z79899 Other long term (current) drug therapy: Secondary | ICD-10-CM | POA: Diagnosis not present

## 2014-09-08 DIAGNOSIS — R591 Generalized enlarged lymph nodes: Secondary | ICD-10-CM

## 2014-09-08 DIAGNOSIS — R590 Localized enlarged lymph nodes: Secondary | ICD-10-CM | POA: Insufficient documentation

## 2014-09-08 DIAGNOSIS — F419 Anxiety disorder, unspecified: Secondary | ICD-10-CM | POA: Diagnosis not present

## 2014-09-08 DIAGNOSIS — Z791 Long term (current) use of non-steroidal anti-inflammatories (NSAID): Secondary | ICD-10-CM | POA: Insufficient documentation

## 2014-09-08 DIAGNOSIS — I10 Essential (primary) hypertension: Secondary | ICD-10-CM | POA: Insufficient documentation

## 2014-09-08 DIAGNOSIS — R091 Pleurisy: Secondary | ICD-10-CM | POA: Diagnosis present

## 2014-09-08 LAB — URINALYSIS, ROUTINE W REFLEX MICROSCOPIC
BILIRUBIN URINE: NEGATIVE
Glucose, UA: NEGATIVE mg/dL
HGB URINE DIPSTICK: NEGATIVE
KETONES UR: NEGATIVE mg/dL
NITRITE: NEGATIVE
PROTEIN: NEGATIVE mg/dL
Specific Gravity, Urine: 1.025 (ref 1.005–1.030)
UROBILINOGEN UA: 1 mg/dL (ref 0.0–1.0)
pH: 7 (ref 5.0–8.0)

## 2014-09-08 LAB — URINE MICROSCOPIC-ADD ON

## 2014-09-08 NOTE — ED Provider Notes (Signed)
CSN: [id number]     Arrival date & time 09/08/14  [id number] History   First MD Initiated Contact with Patient 09/08/14 1050     Chief Complaint  Patient presents with  . Pleurisy     Patient is a 29 y.o. male presenting with chest pain. The history is provided by the patient.  Chest Pain Pain location:  L chest and R chest Pain quality: sharp   Pain radiates to:  Does not radiate Pain radiates to the back: no   Pain severity:  Mild Duration: "awhile" Timing:  Intermittent Progression:  Unchanged Chronicity:  Recurrent Relieved by:  Nothing Worsened by:  Certain positions (palpation) Associated symptoms: no abdominal pain, no dizziness, no fever, no lower extremity edema, no shortness of breath, no syncope, not vomiting and no weakness   Risk factors: no coronary artery disease and no prior DVT/PE   pt reports right and left sided CP for "awhile" seems worse with palpation of chest wall No SOB/fever/cough He reports it is not exertional.  No pleuritic CP He reports previous medications that were given to him had unwanted side effects  He also reports neck pain for "awhile" feels like there are "knots" in that area  No h/o CAD/DVT/PE  Past Medical History  Diagnosis Date  . Hypertension   . Anxiety   . Anxiety    Past Surgical History  Procedure Laterality Date  . Knee surgery     History reviewed. No pertinent family history. History  Substance Use Topics  . Smoking status: Former Smoker    Quit date: 03/02/2014  . Smokeless tobacco: Not on file  . Alcohol Use: No     Comment: occasionally    Review of Systems  Constitutional: Negative for fever.  Respiratory: Negative for shortness of breath.   Cardiovascular: Positive for chest pain. Negative for syncope.  Gastrointestinal: Negative for vomiting and abdominal pain.  Neurological: Negative for dizziness and weakness.  All other systems reviewed and are negative.     Allergies  Lisinopril  Home Medications    Prior to Admission medications   Medication Sig Start Date End Date Taking? Authorizing Provider  ALPRAZolam Prudy Feeler(XANAX) 0.5 MG tablet Take 0.5 mg by mouth daily as needed for anxiety.    Historical Provider, MD  amLODipine (NORVASC) 10 MG tablet Take 1 tablet (10 mg total) by mouth daily. 08/01/13   Wendall StadePeter C Nishan, MD  diclofenac (VOLTAREN) 75 MG EC tablet Take 1 tablet (75 mg total) by mouth 2 (two) times daily. 06/18/14   Lenda KelpShane R Hudnall, MD  hydrochlorothiazide (HYDRODIURIL) 25 MG tablet Take 1 tablet (25 mg total) by mouth daily. 06/20/13   Wendall StadePeter C Nishan, MD  omeprazole (PRILOSEC) 20 MG capsule Take 1 capsule (20 mg total) by mouth daily. 01/27/14   April Palumbo, MD   BP 137/75 mmHg  Pulse 70  Temp(Src) 98.8 F (37.1 C) (Oral)  Resp 16  Ht 5\' 6"  (1.676 m)  Wt 196 lb (88.905 kg)  BMI 31.65 kg/m2  SpO2 98% Physical Exam CONSTITUTIONAL: Well developed/well nourished HEAD: Normocephalic/atraumatic EYES: EOMI/PERRL ENMT: Mucous membranes moist NECK: supple no meningeal signs.  He has mild posterior cervical lymphadenopathy on left.  No tenderness/erythema noted.   SPINE/BACK:entire spine nontender CV: S1/S2 noted, no murmurs/rubs/gallops noted Chest - mild tenderness along chest wall.  No supraclavicular tenderenss/lymphadenopathy noted LUNGS: Lungs are clear to auscultation bilaterally, no apparent distress ABDOMEN: soft, nontender, no rebound or guarding, bowel sounds noted throughout abdomen GU:no cva tenderness NEURO:  Pt is awake/alert/appropriate, moves all extremitiesx4.  No facial droop.   EXTREMITIES: pulses normal/equal, full ROM SKIN: warm, color normal PSYCH: no abnormalities of mood noted, alert and oriented to situation  ED Course  Procedures   Pt well appearing.  He has had evaluation for CP previously, I don't feel this represents ACS/PE/Dissection and he is PERC negative  For lymphadenopathy, unclear when this started.  Pt poor historian Advised close f/u with PCP  in the next month for re-evaluation  Labs Review Labs Reviewed  URINALYSIS, ROUTINE W REFLEX MICROSCOPIC - Abnormal; Notable for the following:    APPearance CLOUDY (*)    Leukocytes, UA TRACE (*)    All other components within normal limits  URINE MICROSCOPIC-ADD ON - Abnormal; Notable for the following:    Bacteria, UA FEW (*)    All other components within normal limits    Imaging Review Dg Chest 2 View  09/08/2014   CLINICAL DATA:  Pleurisy  EXAM: CHEST  2 VIEW  COMPARISON:  06/07/2014  FINDINGS: Normal heart size and mediastinal contours. No acute infiltrate or edema. No effusion or pneumothorax. No acute osseous findings.  IMPRESSION: No pleural effusion or other abnormality.   Electronically Signed   By: Marnee Spring M.D.   On: 09/08/2014 10:27     EKG Interpretation   Date/Time:  Tuesday September 08 2014 10:05:09 EDT Ventricular Rate:  71 PR Interval:  144 QRS Duration: 88 QT Interval:  376 QTC Calculation: 408 R Axis:   102 Text Interpretation:  Normal sinus rhythm with sinus arrhythmia Rightward  axis Borderline ECG No significant change since last tracing Confirmed by  Bebe Shaggy  MD, Dorinda Hill ([id number]) on 09/08/2014 10:14:18 AM      MDM   Final diagnoses:  Chest pain, unspecified chest pain type  Lymphadenopathy    Nursing notes including past medical history and social history reviewed and considered in documentation Labs/vital reviewed myself and considered during evaluation xrays/imaging reviewed by myself and considered during evaluation     Zadie Rhine, MD 09/08/14 1554

## 2014-09-08 NOTE — Discharge Instructions (Signed)

## 2014-09-08 NOTE — ED Notes (Signed)
Pt amb to room7 with quick steady gait in nad. 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 Pt indicates pain is beneath both breast areas, tender to palp. [facility] and was rx vicodin and muscle relaxers, which help his pain when he takes them. Pt states "i don't like the side effects".

## 2014-11-20 ENCOUNTER — Emergency Department (HOSPITAL_BASED_OUTPATIENT_CLINIC_OR_DEPARTMENT_OTHER)
Admission: EM | Admit: 2014-11-20 | Discharge: 2014-11-20 | Disposition: A | Discharge status: Home / Self Care | Payer: Managed Care, Other (non HMO) | Attending: Emergency Medicine | Admitting: Emergency Medicine

## 2014-11-20 ENCOUNTER — Encounter (HOSPITAL_BASED_OUTPATIENT_CLINIC_OR_DEPARTMENT_OTHER): Payer: Self-pay

## 2014-11-20 DIAGNOSIS — Z79899 Other long term (current) drug therapy: Secondary | ICD-10-CM | POA: Diagnosis not present

## 2014-11-20 DIAGNOSIS — I10 Essential (primary) hypertension: Secondary | ICD-10-CM | POA: Insufficient documentation

## 2014-11-20 DIAGNOSIS — Z87891 Personal history of nicotine dependence: Secondary | ICD-10-CM | POA: Diagnosis not present

## 2014-11-20 DIAGNOSIS — M62838 Other muscle spasm: Secondary | ICD-10-CM

## 2014-11-20 DIAGNOSIS — M25511 Pain in right shoulder: Secondary | ICD-10-CM | POA: Diagnosis present

## 2014-11-20 DIAGNOSIS — Z8659 Personal history of other mental and behavioral disorders: Secondary | ICD-10-CM | POA: Insufficient documentation

## 2014-11-20 MED ORDER — METHOCARBAMOL 500 MG PO TABS: 500.0000 mg | ORAL_TABLET | Freq: Two times a day (BID) | ORAL | Status: DC

## 2014-11-20 MED ORDER — NAPROXEN 500 MG PO TABS: 500.0000 mg | ORAL_TABLET | Freq: Two times a day (BID) | ORAL | Status: DC

## 2014-11-20 NOTE — ED Provider Notes (Signed)
CSN: [id number]     Arrival date & time 11/20/14  1212 History   First MD Initiated Contact with Patient 11/20/14 1226     Chief Complaint  Patient presents with  . Shoulder Pain     (Consider location/radiation/quality/duration/timing/severity/associated sxs/prior Treatment) Patient is a 29 y.o. male presenting with shoulder pain. The history is provided by the patient and medical records.  Shoulder Pain  This is a 29 year old male with past medical history significant for hypertension and anxiety, presenting to the ED for right shoulder pain for the past week. Patient states shoulder feels very "sore" and "tight". He denies any known injury, trauma, or falls. He does perform heavy lifting throughout the day at work including heavy boxes, bags of dog food, laundry detergent, etc. He denies any numbness, paresthesias, or weakness of right arm.  He has been applying ice to arm with limited improvement. No medications tried. Patient is right-hand dominant.  VSS.  Past Medical History  Diagnosis Date  . Hypertension   . Anxiety   . Anxiety    Past Surgical History  Procedure Laterality Date  . Knee surgery     No family history on file. History  Substance Use Topics  . Smoking status: Former Smoker    Quit date: 03/02/2014  . Smokeless tobacco: Not on file  . Alcohol Use: 0.0 oz/week    0 Standard drinks or equivalent per week     Comment: occasionally    Review of Systems  Musculoskeletal: Positive for arthralgias.  All other systems reviewed and are negative.     Allergies  Lisinopril  Home Medications   Prior to Admission medications   Medication Sig Start Date End Date Taking? Authorizing Provider  amLODipine (NORVASC) 10 MG tablet Take 1 tablet (10 mg total) by mouth daily. 08/01/13   Wendall StadePeter C Nishan, MD  hydrochlorothiazide (HYDRODIURIL) 25 MG tablet Take 1 tablet (25 mg total) by mouth daily. 06/20/13   Wendall StadePeter C Nishan, MD   BP 139/78 mmHg  Pulse 90  Temp(Src)  98.2 F (36.8 C) (Oral)  Resp 18  Ht 5\' 5"  (1.651 m)  Wt 197 lb (89.359 kg)  BMI 32.78 kg/m2  SpO2 97%   Physical Exam  Constitutional: He is oriented to person, place, and time. He appears well-developed and well-nourished. No distress.  HENT:  Head: Normocephalic and atraumatic.  Mouth/Throat: Oropharynx is clear and moist.  Eyes: Conjunctivae and EOM are normal. Pupils are equal, round, and reactive to light.  Neck: Normal range of motion. Neck supple.  Cardiovascular: Normal rate, regular rhythm and normal heart sounds.   Pulmonary/Chest: Effort normal and breath sounds normal. No respiratory distress. He has no wheezes.  Musculoskeletal: Normal range of motion.       Right shoulder: He exhibits tenderness, pain and spasm. He exhibits normal range of motion, no bony tenderness, no swelling, no effusion, no crepitus, no deformity, no laceration, normal pulse and normal strength.  Right shoulder with tenderness and spasm along right trapezius; full ROM of right shoulder; no acute bony deformities or swelling; strong radial pulse and cap refill; sensation intact fully throughout arm  Neurological: He is alert and oriented to person, place, and time.  Skin: Skin is warm and dry. He is not diaphoretic.  Psychiatric: He has a normal mood and affect.  Nursing note and vitals reviewed.   ED Course  Procedures (including critical care time) Labs Review Labs Reviewed - No data to display  Imaging Review No results found.  EKG Interpretation None      MDM   Final diagnoses:  Muscle spasm of right shoulder   29 year old male here with atraumatic right shoulder pain. Suspect muscular strain given his tenderness and spasm along right trapezius. Arm is neurovascularly intact. Will start on anti-inflammatories and muscle relaxer. Encouraged supportive care at home including heat therapy. Patient is to follow-up with his primary care physician.  Discussed plan with patient, he/she  acknowledged understanding and agreed with plan of care.  Return precautions given for new or worsening symptoms.  Garlon Hatchet, PA-C 11/20/14 1241  Vanetta Mulders, MD 11/21/14 [phone]

## 2014-11-20 NOTE — ED Notes (Signed)
C/o pain to right shoulder-worse with movement-denies injury but does lift heavy objects at work

## 2014-11-20 NOTE — Discharge Instructions (Signed)
Take the prescribed medication as directed.  May wish to use heat therapy at home as well to help loosen muscles. Follow-up with your primary care physician. Return to the ED for new or worsening symptoms.

## 2014-12-16 NOTE — Telephone Encounter (Signed)
finished

## 2014-12-19 ENCOUNTER — Encounter (HOSPITAL_BASED_OUTPATIENT_CLINIC_OR_DEPARTMENT_OTHER): Payer: Self-pay | Admitting: Emergency Medicine

## 2014-12-19 ENCOUNTER — Emergency Department (HOSPITAL_BASED_OUTPATIENT_CLINIC_OR_DEPARTMENT_OTHER)
Admission: EM | Admit: 2014-12-19 | Discharge: 2014-12-19 | Disposition: A | Discharge status: Home / Self Care | Payer: Managed Care, Other (non HMO) | Attending: Emergency Medicine | Admitting: Emergency Medicine

## 2014-12-19 DIAGNOSIS — Y998 Other external cause status: Secondary | ICD-10-CM | POA: Insufficient documentation

## 2014-12-19 DIAGNOSIS — T675XXA Heat exhaustion, unspecified, initial encounter: Secondary | ICD-10-CM | POA: Diagnosis not present

## 2014-12-19 DIAGNOSIS — Y9289 Other specified places as the place of occurrence of the external cause: Secondary | ICD-10-CM | POA: Diagnosis not present

## 2014-12-19 DIAGNOSIS — Z79899 Other long term (current) drug therapy: Secondary | ICD-10-CM | POA: Diagnosis not present

## 2014-12-19 DIAGNOSIS — Z8659 Personal history of other mental and behavioral disorders: Secondary | ICD-10-CM | POA: Insufficient documentation

## 2014-12-19 DIAGNOSIS — Z87891 Personal history of nicotine dependence: Secondary | ICD-10-CM | POA: Diagnosis not present

## 2014-12-19 DIAGNOSIS — X30XXXA Exposure to excessive natural heat, initial encounter: Secondary | ICD-10-CM | POA: Insufficient documentation

## 2014-12-19 DIAGNOSIS — I10 Essential (primary) hypertension: Secondary | ICD-10-CM | POA: Diagnosis not present

## 2014-12-19 DIAGNOSIS — Y9389 Activity, other specified: Secondary | ICD-10-CM | POA: Insufficient documentation

## 2014-12-19 DIAGNOSIS — R531 Weakness: Secondary | ICD-10-CM | POA: Diagnosis present

## 2014-12-19 DIAGNOSIS — J029 Acute pharyngitis, unspecified: Secondary | ICD-10-CM | POA: Insufficient documentation

## 2014-12-19 DIAGNOSIS — Z791 Long term (current) use of non-steroidal anti-inflammatories (NSAID): Secondary | ICD-10-CM | POA: Insufficient documentation

## 2014-12-19 LAB — BASIC METABOLIC PANEL
ANION GAP: 9 (ref 5–15)
BUN: 19 mg/dL (ref 6–20)
CO2: 30 mmol/L (ref 22–32)
Calcium: 9.2 mg/dL (ref 8.9–10.3)
Chloride: 95 mmol/L — ABNORMAL LOW (ref 101–111)
Creatinine, Ser: 1.06 mg/dL (ref 0.61–1.24)
GFR calc non Af Amer: >60 mL/min (ref >60)
Glucose, Bld: 87 mg/dL (ref 65–99)
Potassium: 3.3 mmol/L — ABNORMAL LOW (ref 3.5–5.1)
Sodium: 134 mmol/L — ABNORMAL LOW (ref 135–145)

## 2014-12-19 LAB — URINALYSIS, ROUTINE W REFLEX MICROSCOPIC
Bilirubin Urine: NEGATIVE
Glucose, UA: NEGATIVE mg/dL
Hgb urine dipstick: NEGATIVE
Ketones, ur: NEGATIVE mg/dL
LEUKOCYTES UA: NEGATIVE
Nitrite: NEGATIVE
PH: 7 (ref 5.0–8.0)
Protein, ur: NEGATIVE mg/dL
SPECIFIC GRAVITY, URINE: 1.023 (ref 1.005–1.030)
UROBILINOGEN UA: 0.2 mg/dL (ref 0.0–1.0)

## 2014-12-19 LAB — CK: CK TOTAL: 343 U/L (ref 49–397)

## 2014-12-19 LAB — RAPID STREP SCREEN (MED CTR MEBANE ONLY): Streptococcus, Group A Screen (Direct): NEGATIVE

## 2014-12-19 MED ORDER — SODIUM CHLORIDE 0.9 % IV BOLUS (SEPSIS)
1000.0000 mL | Freq: Once | INTRAVENOUS | Status: AC
  Administered 2014-12-19: 1000 mL via INTRAVENOUS

## 2014-12-19 MED ORDER — KETOROLAC TROMETHAMINE 30 MG/ML IJ SOLN
30.0000 mg | Freq: Once | INTRAMUSCULAR | Status: AC
  Administered 2014-12-19: 30 mg via INTRAVENOUS
  Filled 2014-12-19: qty 1

## 2014-12-19 NOTE — Discharge Instructions (Signed)
Heat Illness °If the body is unable maintain a proper body temperature in hot and/or humid conditions during physical activity, severe illness may occur. To maintain a relatively constant body temperature, the body radiates heat out to the environment and evaporates sweat. In very hot and/or humid conditions, these two methods of heat loss may not work properly. In order to perform physically in such a climate, the body must have time to acclimate (make physiological changes to compensate), such as increase the rate of sweating. When performing physical activity in hot and/or humid environments, it is important to stay hydrated. Adequate hydration will help the body sweat properly. If the body temperature is allowed to increase too much, a person's judgment and performance will decline. °SYMPTOMS  °· Dizziness. °· Fatigue. °· Changes in judgment. °· Muscle cramps. °· Weakness. °· Nausea and vomiting. °· Rapid heart rate. °· Fainting. °· Death. °· Diarrhea. °· Seizures. °· Liver failure. °· Kidney failure. °· Low blood pressure. °· Loss of consciousness (coma). °· Elevated body temperature. °CAUSES  °· Hot and/or humid conditions. °· Poor conditioning. °· Not being acclimated to the heat. °· Dehydration. °· Obesity. °· Inappropriate clothing (does not allow water to evaporate). °· Age (very old and very young people). °· Medications: diuretics, caffeine, decongestants, stimulants, some blood pressure medications. °RISK INCREASES WITH: °· Older age (decreased body water, decreased blood supply to skin, resulting in decreased sweating, decreased sweat rate). °· Young boys (decreased sweat rate compared with men). °· Dehydration. °· Not being acclimated to the heat (this takes 1 to 2 hours per day for a minimum of 6 days). °· Waiting until thirsty to drink. °· Use of stimulants. Amphetamines, cocaine, or decongestants increase risk for heat illness. °· Use of diuretics (increases urination). °· Use of medicines with  anticholinergic properties. °· Use of medicines that slow the heart rate. °PREVENTION  °· Maintain needed hydration before, during, and after exercise. °· Wear clothing that allows sweat to evaporate (light colored, lightweight, breathable). °· Take the time to acclimate to the heat. °· Avoid salt tablets (they irritate the stomach). °· Monitor weight after practices. °· Avoid physical activity during the hottest times of day. °PROGNOSIS  °Most athletes who suffer from heat illness will recover completely, if treated. Severe heat illness is a medical emergency and may require hospitalization. °RELATED COMPLICATIONS  °· Rhabdomyolysis (death of muscle, resulting in weakness and pain). °· Acute respiratory distress syndrome (lining of the lung is altered to prevent oxygen from getting into the bloodstream, which can result in death). °· Disseminated intravascular coagulation (spontaneous clotting of the blood, resulting in an inability to make normal blood clots when needed). °· Kidney failure. °· Liver failure. °· Seizures (abnormal electrical activity in the brain). °· Death. °TREATMENT °The most important treatment is to remove affected persons from the heat. Give the patient cool water to drink. For severe cases of heat illness, it may be necessary to cool the patient more aggressively, such as with an ice bath or cold shower. If symptoms persist after treatment, seek medical attention. °MEDICATION  °· Oxygen is used in severe cases, if there is lung damage. °· Fluid injections may be given for hydration. °ACTIVITY  °· A patient may return to sport as soon as he or she is able. °· Heat illness may make an athlete vulnerable to future episodes of heat illness. °· Allow the body to acclimate before performing in hot and/or humid conditions. °DIET  °Drink 8 oz. of fluid before exercise   and 4 oz. of fluid every 15 to 20 minutes during exercise. As an alternative, try to drink about 1 quart of fluid for every hour of  exercise. SEEK MEDICAL CARE IF:   You develop vomiting or diarrhea after exercising in the heat.  Someone collapses while exercising in the heat.  You have increasing problems exercising in the heat.  You notice increased muscle aches after exercising in the heat.  There is a change in the color of your urine after exercise. Document Released: 05/01/2005 Document Revised: 07/24/2011 Document Reviewed: 08/13/2008 [employer] Patient Information 2015 [employer]. This information is not intended to replace advice given to you by your health care provider. Make sure you discuss any questions you have with your health care provider.  

## 2014-12-19 NOTE — ED Notes (Signed)
Pt in c/o generalized body aches and weakness with associated lightheadedness onset yesterday, has been working outside in the heat lately.

## 2014-12-19 NOTE — ED Provider Notes (Signed)
CSN: [id number]   Arrival date & time 12/19/14 1741  History  This chart was scribed for  Rolland Porter, MD by Bethel Born, ED Scribe. This patient was seen in room MH06/MH06 and the patient's care was started at 8:08 PM.  Chief Complaint  Patient presents with  . Generalized Body Aches  . Weakness    HPI The history is provided by the patient. No language interpreter was used.   Nathan Nelson is a 29 y.o. male who presents to the Emergency Department complaining of increasing generalized myalgias with onset yesterday. Associated symptoms include generalized weakness, light headedness, a "weird feeling" in the head (denies pain), mild sore throat with swallowing, and intermittent nausea (none presently). Pt denies cough, SOB, and abdominal pain. 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 Urinating normally.  Past Medical History  Diagnosis Date  . Hypertension   . Anxiety   . Anxiety     Past Surgical History  Procedure Laterality Date  . Knee surgery      History reviewed. No pertinent family history.  History  Substance Use Topics  . Smoking status: Former Smoker    Quit date: 03/02/2014  . Smokeless tobacco: Not on file  . Alcohol Use: 0.0 oz/week    0 Standard drinks or equivalent per week     Comment: occasionally     Review of Systems  Constitutional: Negative for fever, chills, diaphoresis, appetite change and fatigue.  HENT: Positive for sore throat. Negative for mouth sores and trouble swallowing.         "weird feeling" in the head  Eyes: Negative for visual disturbance.  Respiratory: Negative for cough, chest tightness, shortness of breath and wheezing.   Cardiovascular: Negative for chest pain.  Gastrointestinal: Positive for nausea. Negative for vomiting, abdominal pain, diarrhea and abdominal distention.  Endocrine: Negative for polydipsia, polyphagia and polyuria.  Genitourinary: Negative for dysuria,  frequency and hematuria.  Musculoskeletal: Positive for myalgias. Negative for gait problem.  Skin: Negative for color change, pallor and rash.  Neurological: Positive for light-headedness. Negative for dizziness, syncope and headaches.  Hematological: Does not bruise/bleed easily.  Psychiatric/Behavioral: Negative for behavioral problems and confusion.     Home Medications   Prior to Admission medications   Medication Sig Start Date End Date Taking? Authorizing Provider  amLODipine (NORVASC) 10 MG tablet Take 1 tablet (10 mg total) by mouth daily. 08/01/13   Wendall Stade, MD  hydrochlorothiazide (HYDRODIURIL) 25 MG tablet Take 1 tablet (25 mg total) by mouth daily. 06/20/13   Wendall Stade, MD  methocarbamol (ROBAXIN) 500 MG tablet Take 1 tablet (500 mg total) by mouth 2 (two) times daily. 11/20/14   Garlon Hatchet, PA-C  naproxen (NAPROSYN) 500 MG tablet Take 1 tablet (500 mg total) by mouth 2 (two) times daily with a meal. 11/20/14   Garlon Hatchet, PA-C    Allergies  Lisinopril  Triage Vitals: BP 145/76 mmHg  Pulse 95  Temp(Src) 100.3 F (37.9 C) (Oral)  Resp 20  Ht  (1.676 m)  Wt 190 lb (86.183 kg)  BMI 30.68 kg/m2  SpO2 100%  Physical Exam  Constitutional: He is oriented to person, place, and time. He appears well-developed and well-nourished. No distress.  HENT:  Head: Normocephalic.  Erythema One small yellow ulcer at posterior pharynx  Eyes: Conjunctivae are normal. Pupils are equal, round, and reactive to light. No scleral icterus.  Neck: Normal  range of motion. Neck supple. No thyromegaly present.  Cardiovascular: Normal rate and regular rhythm.  Exam reveals no gallop and no friction rub.   No murmur heard. Pulmonary/Chest: Effort normal and breath sounds normal. No respiratory distress. He has no wheezes. He has no rales.  Abdominal: Soft. Bowel sounds are normal. He exhibits no distension. There is no tenderness. There is no rebound.  Musculoskeletal: Normal  range of motion.  Neurological: He is alert and oriented to person, place, and time.  Skin: Skin is warm and dry. No rash noted.  Psychiatric: He has a normal mood and affect. His behavior is normal.    ED Course  Procedures   DIAGNOSTIC STUDIES: Oxygen Saturation is 100% on RA, normal by my interpretation.    COORDINATION OF CARE: 8:14 PM Discussed treatment plan which includes IVF, labs  with pt at bedside and pt agreed to plan.  Labs Review-  Labs Reviewed  BASIC METABOLIC PANEL - Abnormal; Notable for the following:    Sodium 134 (*)    Potassium 3.3 (*)    Chloride 95 (*)    All other components within normal limits  RAPID STREP SCREEN (NOT AT [facility])  CULTURE, GROUP A STREP  URINALYSIS, ROUTINE W REFLEX MICROSCOPIC (NOT AT [facility])  CK    Imaging Review No results found.  EKG Interpretation None      MDM   Final diagnoses:  Heat exhaustion, initial encounter     Hydrated. Reassuring labs. Discharged home. Rest stay hydrated.  Rolland Porter, MD 12/19/14 2235

## 2014-12-22 LAB — CULTURE, GROUP A STREP: STREP A CULTURE: NEGATIVE

## 2015-01-31 ENCOUNTER — Emergency Department (HOSPITAL_COMMUNITY): Payer: Managed Care, Other (non HMO)

## 2015-01-31 ENCOUNTER — Emergency Department (HOSPITAL_COMMUNITY)
Admission: EM | Admit: 2015-01-31 | Discharge: 2015-01-31 | Disposition: A | Discharge status: Home / Self Care | Payer: Managed Care, Other (non HMO) | Attending: Emergency Medicine | Admitting: Emergency Medicine

## 2015-01-31 ENCOUNTER — Encounter (HOSPITAL_COMMUNITY): Payer: Self-pay | Admitting: Emergency Medicine

## 2015-01-31 DIAGNOSIS — I10 Essential (primary) hypertension: Secondary | ICD-10-CM | POA: Insufficient documentation

## 2015-01-31 DIAGNOSIS — Z791 Long term (current) use of non-steroidal anti-inflammatories (NSAID): Secondary | ICD-10-CM | POA: Insufficient documentation

## 2015-01-31 DIAGNOSIS — R1013 Epigastric pain: Secondary | ICD-10-CM | POA: Diagnosis not present

## 2015-01-31 DIAGNOSIS — M545 Low back pain: Secondary | ICD-10-CM | POA: Diagnosis not present

## 2015-01-31 DIAGNOSIS — R0789 Other chest pain: Secondary | ICD-10-CM | POA: Insufficient documentation

## 2015-01-31 DIAGNOSIS — F419 Anxiety disorder, unspecified: Secondary | ICD-10-CM | POA: Insufficient documentation

## 2015-01-31 DIAGNOSIS — R079 Chest pain, unspecified: Secondary | ICD-10-CM | POA: Diagnosis present

## 2015-01-31 DIAGNOSIS — M25511 Pain in right shoulder: Secondary | ICD-10-CM | POA: Diagnosis not present

## 2015-01-31 DIAGNOSIS — Z79899 Other long term (current) drug therapy: Secondary | ICD-10-CM | POA: Diagnosis not present

## 2015-01-31 DIAGNOSIS — M62838 Other muscle spasm: Secondary | ICD-10-CM | POA: Insufficient documentation

## 2015-01-31 DIAGNOSIS — Z87891 Personal history of nicotine dependence: Secondary | ICD-10-CM | POA: Diagnosis not present

## 2015-01-31 LAB — URINALYSIS, DIPSTICK ONLY
Bilirubin Urine: NEGATIVE
Glucose, UA: NEGATIVE mg/dL
Hgb urine dipstick: NEGATIVE
Ketones, ur: NEGATIVE mg/dL
Leukocytes, UA: NEGATIVE
Nitrite: NEGATIVE
Protein, ur: NEGATIVE mg/dL
Specific Gravity, Urine: 1.017 (ref 1.005–1.030)
Urobilinogen, UA: 0.2 mg/dL (ref 0.0–1.0)
pH: 6.5 (ref 5.0–8.0)

## 2015-01-31 LAB — BASIC METABOLIC PANEL
ANION GAP: 9 (ref 5–15)
BUN: 18 mg/dL (ref 6–20)
CHLORIDE: 102 mmol/L (ref 101–111)
CO2: 25 mmol/L (ref 22–32)
Calcium: 9.5 mg/dL (ref 8.9–10.3)
Creatinine, Ser: 0.9 mg/dL (ref 0.61–1.24)
GFR calc Af Amer: >60 mL/min (ref >60)
GFR calc non Af Amer: >60 mL/min (ref >60)
GLUCOSE: 109 mg/dL — AB (ref 65–99)
POTASSIUM: 4 mmol/L (ref 3.5–5.1)
Sodium: 136 mmol/L (ref 135–145)

## 2015-01-31 LAB — I-STAT TROPONIN, ED: Troponin i, poc: 0 ng/mL (ref 0.00–0.08)

## 2015-01-31 LAB — CBC
HEMATOCRIT: 45.2 % (ref 39.0–52.0)
HEMOGLOBIN: 15.2 g/dL (ref 13.0–17.0)
MCH: 29.3 pg (ref 26.0–34.0)
MCHC: 33.6 g/dL (ref 30.0–36.0)
MCV: 87.3 fL (ref 78.0–100.0)
Platelets: 395 10*3/uL (ref 150–400)
RBC: 5.18 MIL/uL (ref 4.22–5.81)
RDW: 13.5 % (ref 11.5–15.5)
WBC: 9.8 10*3/uL (ref 4.0–10.5)

## 2015-01-31 MED ORDER — BACLOFEN 10 MG PO TABS: 10.0000 mg | ORAL_TABLET | Freq: Two times a day (BID) | ORAL | Status: DC | PRN

## 2015-01-31 NOTE — Discharge Instructions (Signed)
All of your labs and imaging were normal; I do not believe you are having any life threatening causes of your muscle pain Giving you a prescription for muscle relaxant. You can use that as needed along with ibuprofen and ice/heat to help relieve symptoms Symptoms consistent with muscle injury or strain best to rest area.  Follow-up with a primary care doctor who can continue to monitor this or sports medicine doctor.    Muscle Strain A muscle strain (pulled muscle) happens when a muscle is stretched beyond normal length. It happens when a sudden, violent force stretches your muscle too far. Usually, a few of the fibers in your muscle are torn. Muscle strain is common in athletes. Recovery usually takes 1-2 weeks. Complete healing takes 5-6 weeks.  HOME CARE   Follow the PRICE method of treatment to help your injury get better. Do this the first 2-3 days after the injury:  Protect. Protect the muscle to keep it from getting injured again.  Rest. Limit your activity and rest the injured body part.  Ice. Put ice in a plastic bag. Place a towel between your skin and the bag. Then, apply the ice and leave it on from 15-20 minutes each hour. After the third day, switch to moist heat packs.  Compression. Use a splint or elastic bandage on the injured area for comfort. Do not put it on too tightly.  Elevate. Keep the injured body part above the level of your heart.  Only take medicine as told by your doctor.  Warm up before doing exercise to prevent future muscle strains. GET HELP IF:   You have more pain or puffiness (swelling) in the injured area.  You feel numbness, tingling, or notice a loss of strength in the injured area. MAKE SURE YOU:   Understand these instructions.  Will watch your condition.  Will get help right away if you are not doing well or get worse. Document Released: 02/08/2008 Document Revised: 02/19/2013 Document Reviewed: 11/28/2012 [facility] Patient Information  2015 [place], [place]. This information is not intended to replace advice given to you by your health care provider. Make sure you discuss any questions you have with your health care provider.

## 2015-01-31 NOTE — ED Notes (Signed)
Pt complaining of aching intermittent pain on-going x 2 weeks to lower left back, left lower chest, epigastric area, right shoulder and right neck areas. Denies any specific injuries to areas, states at work he just lifts heavy boxes all day. No obvious injuries to areas. Denies any SOB, N/V/D, fevers/chills.

## 2015-01-31 NOTE — ED Provider Notes (Signed)
CSN: [id number]     Arrival date & time 01/31/15  7829 History   First MD Initiated Contact with Patient 01/31/15 [phone]     Chief Complaint  Patient presents with  . Chest Pain  . Abdominal Pain  . Shoulder Pain    HPI Nathan Nelson is a 29 y.o. male, with past medical history significant for HTN and anxiety, who presents to the ED for right chest pain, right shoulder pain, and abdominal pain. Patient states he ahs been having symptoms for the last two weeks. Pain is located around the back of neck and back of right shoulder. States that he lifts heavy boxes at work and takes ibuprofen prior to going to work which helps relieve his symtpoms some. Sometimes feels like he can't get a good nights sleep due to muscle aching. Chest pain is intermittent and patient localizes it to one spot in lower right side of chest. He also points to the epigastric region for his abdominal pain. Denies reflux symtpoms. States that when he takes a deep breath it hurts. Denies any injuries. He is right hand dominant.    Past Medical History  Diagnosis Date  . Hypertension   . Anxiety   . Anxiety    Past Surgical History  Procedure Laterality Date  . Knee surgery     History reviewed. No pertinent family history. Social History  Substance Use Topics  . Smoking status: Former Smoker    Quit date: 03/02/2014  . Smokeless tobacco: None  . Alcohol Use: 0.0 oz/week    0 Standard drinks or equivalent per week     Comment: occasionally    Review of Systems  Constitutional: Negative for fever.  Respiratory: Negative for shortness of breath.   Cardiovascular: Positive for chest pain. Negative for leg swelling.  Gastrointestinal: Positive for abdominal pain. Negative for nausea and vomiting.  Musculoskeletal: Positive for back pain.  Neurological: Positive for weakness. Negative for dizziness, light-headedness and numbness.  Also per [facility]  Allergies  Lisinopril  Home Medications   Prior to Admission  medications   Medication Sig Start Date End Date Taking? Authorizing An[place]wan Mchugh  amLODipine (NORVASC) 10 MG tablet Take 1 tablet (10 mg total) by mouth daily. 08/01/13  Yes Wendall Stade, MD  hydrochlorothiazide (HYDRODIURIL) 25 MG tablet Take 1 tablet (25 mg total) by mouth daily. 06/20/13  Yes Wendall Stade, MD  naproxen sodium (ANAPROX) 275 MG tablet Take 275 mg by mouth 2 (two) times daily with a meal.   Yes Historical Darlisha Kelm, MD  baclofen (LIORESAL) 10 MG tablet Take 1 tablet (10 mg total) by mouth 2 (two) times daily as needed for muscle spasms. 01/31/15   Pincus Large, DO  methocarbamol (ROBAXIN) 500 MG tablet Take 1 tablet (500 mg total) by mouth 2 (two) times daily. Patient not taking: Reported on 01/31/2015 11/20/14   Garlon Hatchet, PA-C  naproxen (NAPROSYN) 500 MG tablet Take 1 tablet (500 mg total) by mouth 2 (two) times daily with a meal. Patient not taking: Reported on 01/31/2015 11/20/14   Garlon Hatchet, PA-C   BP 139/76 mmHg  Pulse 66  Temp(Src) 98.1 F (36.7 C) (Oral)  Resp 16  SpO2 98% Physical Exam  Constitutional: He appears well-developed and well-nourished. No distress.  HENT:  Head: Normocephalic and atraumatic.  Eyes: EOM are normal.  Cardiovascular: Normal rate, regular rhythm and intact distal pulses.   Pulmonary/Chest: Effort normal and breath sounds normal. He exhibits tenderness. He exhibits no  deformity.  Abdominal: Soft. Bowel sounds are normal. There is tenderness in the epigastric area.  Musculoskeletal: Normal range of motion. He exhibits no edema.       Right shoulder: He exhibits normal range of motion, no tenderness, normal pulse and normal strength.  Neurological: He is alert.  Skin: Skin is warm and dry.  Psychiatric: He has a normal mood and affect.    ED Course  Procedures (including critical care time) Labs Review Labs Reviewed  BASIC METABOLIC PANEL - Abnormal; Notable for the following:    Glucose, Bld 109 (*)    All other components  within normal limits  CBC  URINALYSIS, DIPSTICK ONLY  I-STAT TROPOININ, ED   Imaging Review Dg Chest 1 View  01/31/2015   CLINICAL DATA:  Pain intermittently 2 weeks 2 left lower thorax and epigastric area  EXAM: CHEST  1 VIEW  COMPARISON:  09/08/2014  FINDINGS: The heart size and mediastinal contours are within normal limits. Both lungs are clear. The visualized skeletal structures are unremarkable.  IMPRESSION: No active disease.   Electronically Signed   By: Esperanza Heir M.D.   On: 01/31/2015 10:49   I have personally reviewed and evaluated these images and lab results as part of my medical decision-making.   EKG Interpretation   Date/Time:  Sunday January 31 2015 09:33:04 EDT Ventricular Rate:  70 PR Interval:  134 QRS Duration: 87 QT Interval:  357 QTC Calculation: 385 R Axis:   98 Text Interpretation:  Sinus rhythm Anteroseptal infarct, age indeterminate  Lateral leads are also involved No significant change since last tracing  Confirmed by ZACKOWSKI  MD, SCOTT [phone]) on 01/31/2015 9:36:47 AM      MDM   Final diagnoses:  Chest wall pain  Muscle spasm of right shoulder   Patient presented to ED for right chest wall and shoulder pain. Seen regularly for same problems following lifting at work. EKG unchanged and there are no acute findings on CXR. Highly doubt ACS as patient has history of same with multiple recent work ups and unchanged EKG.Patient is well appearing and vitals are stable. No red flags.  Pain is classically MSK pain. Patient has Rx for GERD medication as well.gave a limited supply of muscle relaxant.Discussed with patient that he should follow-up with PCP or sports medicine for chronic tenderness and muscle pain.   Caryl Ada, DO 01/31/2015, 4:26 PM PGY-2, [facility]  Pincus Large, DO 01/31/15 1631  Nelva Nay, MD 02/01/15 [phone]

## 2015-04-21 ENCOUNTER — Emergency Department (HOSPITAL_BASED_OUTPATIENT_CLINIC_OR_DEPARTMENT_OTHER)
Admission: EM | Admit: 2015-04-21 | Discharge: 2015-04-22 | Disposition: A | Discharge status: Home / Self Care | Payer: Managed Care, Other (non HMO) | Attending: Emergency Medicine | Admitting: Emergency Medicine

## 2015-04-21 DIAGNOSIS — Z79899 Other long term (current) drug therapy: Secondary | ICD-10-CM | POA: Insufficient documentation

## 2015-04-21 DIAGNOSIS — F410 Panic disorder [episodic paroxysmal anxiety] without agoraphobia: Secondary | ICD-10-CM

## 2015-04-21 DIAGNOSIS — Z87891 Personal history of nicotine dependence: Secondary | ICD-10-CM | POA: Insufficient documentation

## 2015-04-21 DIAGNOSIS — F419 Anxiety disorder, unspecified: Secondary | ICD-10-CM | POA: Diagnosis present

## 2015-04-21 DIAGNOSIS — I10 Essential (primary) hypertension: Secondary | ICD-10-CM

## 2015-04-21 DIAGNOSIS — Z791 Long term (current) use of non-steroidal anti-inflammatories (NSAID): Secondary | ICD-10-CM | POA: Diagnosis not present

## 2015-04-21 NOTE — ED Notes (Signed)
Pt states is having anxiety and high bp, states over thinking things,  But he doesn't feel like talking

## 2015-04-21 NOTE — ED Notes (Signed)
Pt states called EMS because he felt anxious and that his blood pressure was high. Pt fidgity at triage. Pt denies drug use, denies issues that would be causing his anxiety and hypertension.

## 2015-04-22 ENCOUNTER — Encounter (HOSPITAL_BASED_OUTPATIENT_CLINIC_OR_DEPARTMENT_OTHER): Payer: Self-pay | Admitting: Emergency Medicine

## 2015-04-22 MED ORDER — CLONAZEPAM 0.5 MG PO TABS: 0.5000 mg | ORAL_TABLET | Freq: Two times a day (BID) | ORAL | Status: DC | PRN

## 2015-04-22 MED ORDER — LORAZEPAM 1 MG PO TABS
1.0000 mg | ORAL_TABLET | Freq: Once | ORAL | Status: AC
  Administered 2015-04-22: 1 mg via ORAL
  Filled 2015-04-22: qty 1

## 2015-04-22 NOTE — ED Provider Notes (Addendum)
CSN: [id number]     Arrival date & time 04/21/15  2226 History   First MD Initiated Contact with Patient 04/22/15 0050     Chief Complaint  Patient presents with  . Anxiety     (Consider location/radiation/quality/duration/timing/severity/associated sxs/prior Treatment) HPI  This is a 29 year old male with a history of anxiety. He is here with an anxiety attack that began yesterday afternoon. There was no known trigger. He does admit to being stressed but when pressed does not wish to speak further about it. This attack lasted longer than his anxiety attacks typically last. This is partly because he could not find his Klonopin prescription. He usually takes Klonopin 0.5 milligrams twice daily. His panic attack was characterized by rapid heart rate, difficulty breathing and paresthesias. He also noted his blood pressure to be up. He denies chest pain. His symptoms have improved since arrival. He denies drug use. He states he is taking his antihypertensives.  Past Medical History  Diagnosis Date  . Hypertension   . Anxiety    Past Surgical History  Procedure Laterality Date  . Knee surgery     No family history on file. Social History  Substance Use Topics  . Smoking status: Former Smoker    Quit date: 03/02/2014  . Smokeless tobacco: Not on file  . Alcohol Use: 0.0 oz/week    0 Standard drinks or equivalent per week     Comment: occasionally    Review of Systems  All other systems reviewed and are negative.   Allergies  Lisinopril  Home Medications   Prior to Admission medications   Medication Sig Start Date End Date Taking? Authorizing Provider  amLODipine (NORVASC) 10 MG tablet Take 1 tablet (10 mg total) by mouth daily. 08/01/13   Wendall Stade, MD  baclofen (LIORESAL) 10 MG tablet Take 1 tablet (10 mg total) by mouth 2 (two) times daily as needed for muscle spasms. 01/31/15   Pincus Large, DO  hydrochlorothiazide (HYDRODIURIL) 25 MG tablet Take 1 tablet (25 mg total)  by mouth daily. 06/20/13   Wendall Stade, MD  methocarbamol (ROBAXIN) 500 MG tablet Take 1 tablet (500 mg total) by mouth 2 (two) times daily. Patient not taking: Reported on 01/31/2015 11/20/14   Garlon Hatchet, PA-C  naproxen (NAPROSYN) 500 MG tablet Take 1 tablet (500 mg total) by mouth 2 (two) times daily with a meal. Patient not taking: Reported on 01/31/2015 11/20/14   Garlon Hatchet, PA-C  naproxen sodium (ANAPROX) 275 MG tablet Take 275 mg by mouth 2 (two) times daily with a meal.    Historical Provider, MD   BP 151/105 mmHg  Pulse 93  Temp(Src) 99.9 F (37.7 C) (Oral)  Resp 18  Ht  (1.626 m)  Wt 205 lb (92.987 kg)  BMI 35.17 kg/m2  SpO2 100%   Physical Exam  General: Well-developed, well-nourished male in no acute distress; appearance consistent with age of record HENT: normocephalic; atraumatic Eyes: pupils equal, round and reactive to light; extraocular muscles intact Neck: supple Heart: regular rate and rhythm Lungs: clear to auscultation bilaterally Abdomen: soft; nondistended; nontender; bowel sounds present Extremities: No deformity; full range of motion; pulses normal Neurologic: Awake, alert and oriented; motor function intact in all extremities and symmetric; no facial droop Skin: Warm and dry Psychiatric: Flat affect    ED Course  Procedures (including critical care time)   EKG Interpretation   Date/Time:  Wednesday April 21 2015 22:39:14 EST Ventricular Rate:  119  PR Interval:  144 QRS Duration: 88 QT Interval:  326 QTC Calculation: 458 R Axis:   102 Text Interpretation:  Sinus tachycardia Rightward axis Abnormal ECG No  significant change was found Confirmed by Read DriversMOLPUS  MD, Jonny RuizJOHN ([id number]) on  04/21/2015 10:45:11 PM      MDM  We'll give patient a brief refill of his Klonopin and have him follow-up with his PCP for reevaluation of his anxiety disorder and hypertension. He had a low-grade fever on arrival which may represent an acute viral illness.     Paula LibraJohn Carriann Hesse, MD 04/22/15 [id number]  Paula LibraJohn Jizelle Conkey, MD 04/22/15 [phone]

## 2015-08-20 ENCOUNTER — Emergency Department (HOSPITAL_BASED_OUTPATIENT_CLINIC_OR_DEPARTMENT_OTHER)
Admission: EM | Admit: 2015-08-20 | Discharge: 2015-08-20 | Disposition: A | Discharge status: Home / Self Care | Payer: Managed Care, Other (non HMO) | Attending: Emergency Medicine | Admitting: Emergency Medicine

## 2015-08-20 ENCOUNTER — Encounter (HOSPITAL_BASED_OUTPATIENT_CLINIC_OR_DEPARTMENT_OTHER): Payer: Self-pay | Admitting: *Deleted

## 2015-08-20 DIAGNOSIS — M940 Chondrocostal junction syndrome [Tietze]: Secondary | ICD-10-CM

## 2015-08-20 DIAGNOSIS — I10 Essential (primary) hypertension: Secondary | ICD-10-CM | POA: Insufficient documentation

## 2015-08-20 DIAGNOSIS — R42 Dizziness and giddiness: Secondary | ICD-10-CM | POA: Insufficient documentation

## 2015-08-20 DIAGNOSIS — M546 Pain in thoracic spine: Secondary | ICD-10-CM | POA: Insufficient documentation

## 2015-08-20 DIAGNOSIS — Z79899 Other long term (current) drug therapy: Secondary | ICD-10-CM | POA: Insufficient documentation

## 2015-08-20 DIAGNOSIS — Z87891 Personal history of nicotine dependence: Secondary | ICD-10-CM | POA: Insufficient documentation

## 2015-08-20 DIAGNOSIS — F410 Panic disorder [episodic paroxysmal anxiety] without agoraphobia: Secondary | ICD-10-CM | POA: Insufficient documentation

## 2015-08-20 MED ORDER — DICLOFENAC SODIUM 75 MG PO TBEC: 75.0000 mg | DELAYED_RELEASE_TABLET | Freq: Two times a day (BID) | ORAL | Status: DC | PRN

## 2015-08-20 MED FILL — DICLOFENAC SOD EC 75 MG TAB: 75 | 30 days supply | Qty: 60 | Fill #0

## 2015-08-20 NOTE — Discharge Instructions (Signed)
I believe your pain is a combination of inflammation of your rib cage and anxiety. I am prescribing an antiinflammatory drug for the rib pain and I strongly recommend you talk to your primary doctor about starting an SSRI for your anxiety.  Panic Attacks Panic attacks are sudden, short-livedsurges of severe anxiety, fear, or discomfort. They may occur for no reason when you are relaxed, when you are anxious, or when you are sleeping. Panic attacks may occur for a number of reasons:   Healthy people occasionally have panic attacks in extreme, life-threatening situations, such as war or natural disasters. Normal anxiety is a protective mechanism of the body that helps [place] react to danger (fight or flight response).  Panic attacks are often seen with anxiety disorders, such as panic disorder, social anxiety disorder, generalized anxiety disorder, and phobias. Anxiety disorders cause excessive or uncontrollable anxiety. They may interfere with your relationships or other life activities.  Panic attacks are sometimes seen with other mental illnesses, such as depression and posttraumatic stress disorder.  Certain medical conditions, prescription medicines, and drugs of abuse can cause panic attacks. SYMPTOMS  Panic attacks start suddenly, peak within 20 minutes, and are accompanied by four or more of the following symptoms:  Pounding heart or fast heart rate (palpitations).  Sweating.  Trembling or shaking.  Shortness of breath or feeling smothered.  Feeling choked.  Chest pain or discomfort.  Nausea or strange feeling in your stomach.  Dizziness, light-headedness, or feeling like you will faint.  Chills or hot flushes.  Numbness or tingling in your lips or hands and feet.  Feeling that things are not real or feeling that you are not yourself.  Fear of losing control or going crazy.  Fear of dying. Some of these symptoms can mimic serious medical conditions. For example, you may  think you are having a heart attack. Although panic attacks can be very scary, they are not life threatening. DIAGNOSIS  Panic attacks are diagnosed through an assessment by your health care provider. Your health care provider will ask questions about your symptoms, such as where and when they occurred. Your health care provider will also ask about your medical history and use of alcohol and drugs, including prescription medicines. Your health care provider may order blood tests or other studies to rule out a serious medical condition. Your health care provider may refer you to a mental health professional for further evaluation. TREATMENT   Most healthy people who have one or two panic attacks in an extreme, life-threatening situation will not require treatment.  The treatment for panic attacks associated with anxiety disorders or other mental illness typically involves counseling with a mental health professional, medicine, or a combination of both. Your health care provider will help determine what treatment is best for you.  Panic attacks due to physical illness usually go away with treatment of the illness. If prescription medicine is causing panic attacks, talk with your health care provider about stopping the medicine, decreasing the dose, or substituting another medicine.  Panic attacks due to alcohol or drug abuse go away with abstinence. Some adults need professional help in order to stop drinking or using drugs. HOME CARE INSTRUCTIONS   Take all medicines as directed by your health care provider.   Schedule and attend follow-up visits as directed by your health care provider. It is important to keep all your appointments. SEEK MEDICAL CARE IF:  You are not able to take your medicines as prescribed.  Your symptoms do  not improve or get worse. SEEK IMMEDIATE MEDICAL CARE IF:   You experience panic attack symptoms that are different than your usual symptoms.  You have serious  thoughts about hurting yourself or others.  You are taking medicine for panic attacks and have a serious side effect. MAKE SURE YOU:  Understand these instructions.  Will watch your condition.  Will get help right away if you are not doing well or get worse.   This information is not intended to replace advice given to you by your health care provider. Make sure you discuss any questions you have with your health care provider.   Document Released: 05/01/2005 Document Revised: 05/06/2013 Document Reviewed: 12/13/2012 Elsevier Interactive Patient Education [employer]2016 [employer] Inc.

## 2015-08-20 NOTE — ED Notes (Signed)
Pt. Reports he has history of anxiety and takes klonopin.  Pt. In no distress and has reports he feels like he has anxiety that he may have medical problems when he has anxiety attacks.

## 2015-08-20 NOTE — ED Notes (Signed)
Left chest pain into his back for several weeks. Worse with movement.

## 2015-08-20 NOTE — ED Provider Notes (Signed)
CSN: [id number]     Arrival date & time 08/20/15  1646 History   First MD Initiated Contact with Patient 08/20/15 1652     Chief Complaint  Patient presents with  . Muscle Pain    HPI Pt is a 30 y.o. male with history of multiple ED visits for chest pain who presents for chest and back pain. Pt reports several weeks of pain in his left chest and left mid back that is intermittent and worsened by deep breathing or palpation. Nothing relieves it that he can identify. Not exertional. Occasionally associated with mild dyspnea and lightheadedness. No fevers, chills. Pain is somewhat positional, not relieved by leaning forward. When the pain is bad he has a feeling of panic and impending doom as well as feeling outside his body. When he relaxes, plays with his kids this gets a little better. He has been on klonopin and xanax for this which helps when he takes them but the feeling comes right back. He also reports ibuprofen helps some with the pain component.  Past Medical History  Diagnosis Date  . Hypertension   . Anxiety    Past Surgical History  Procedure Laterality Date  . Knee surgery     No family history on file. Social History  Substance Use Topics  . Smoking status: Former Smoker    Quit date: 03/02/2014  . Smokeless tobacco: None  . Alcohol Use: 0.0 oz/week    0 Standard drinks or equivalent per week     Comment: occasionally    Review of Systems  See HPI  Allergies  Lisinopril  Home Medications   Prior to Admission medications   Medication Sig Start Date End Date Taking? Authorizing Provider  amLODipine (NORVASC) 10 MG tablet Take 1 tablet (10 mg total) by mouth daily. 08/01/13   Wendall Stade, MD  clonazePAM (KLONOPIN) 0.5 MG tablet Take 1 tablet (0.5 mg total) by mouth 2 (two) times daily as needed for anxiety. 04/22/15   John Molpus, MD  diclofenac (VOLTAREN) 75 MG EC tablet Take 1 tablet (75 mg total) by mouth 2 (two) times daily as needed for moderate pain. 08/20/15    Abram Sander, MD  hydrochlorothiazide (HYDRODIURIL) 25 MG tablet Take 1 tablet (25 mg total) by mouth daily. 06/20/13   Wendall Stade, MD   BP 146/98 mmHg  Pulse 84  Temp(Src) 98.8 F (37.1 C) (Oral)  Resp 18  Ht  (1.651 m)  Wt 97.977 kg  BMI 35.94 kg/m2  SpO2 98% Physical Exam  Constitutional: He is oriented to person, place, and time. He appears well-developed and well-nourished. No distress.  HENT:  Head: Normocephalic and atraumatic.  Right Ear: External ear normal.  Left Ear: External ear normal.  Nose: Nose normal.  Mouth/Throat: Oropharynx is clear and moist.  Eyes: Conjunctivae are normal. Pupils are equal, round, and reactive to light. Right eye exhibits no discharge. Left eye exhibits no discharge. No scleral icterus.  Neck: Normal range of motion. Neck supple. No JVD present. No tracheal deviation present. No thyromegaly present.  Cardiovascular: Normal rate, regular rhythm, normal heart sounds and intact distal pulses.   No murmur heard. Pulmonary/Chest: Effort normal and breath sounds normal. No respiratory distress. He has no wheezes. He exhibits tenderness.  Abdominal: Soft. Bowel sounds are normal. He exhibits no distension and no mass. There is no tenderness. There is no rebound and no guarding.  Musculoskeletal: He exhibits no edema.       Thoracic  back: He exhibits pain. He exhibits normal range of motion, no tenderness, no bony tenderness and no edema.  Lymphadenopathy:    He has no cervical adenopathy.  Neurological: He is alert and oriented to person, place, and time. No cranial nerve deficit.  Skin: Skin is warm and dry. No rash noted. He is not diaphoretic. No pallor.  Psychiatric: His behavior is normal.  Anxious affect  Nursing note and vitals reviewed.   ED Course  Procedures (including critical care time) Labs Review Labs Reviewed - No data to display  Imaging Review No results found. I have personally reviewed and evaluated these images  and lab results as part of my medical decision-making.   EKG Interpretation   Date/Time:  Friday August 20 2015 17:07:39 EDT Ventricular Rate:  81 PR Interval:  149 QRS Duration: 90 QT Interval:  357 QTC Calculation: 414 R Axis:   96 Text Interpretation:  Sinus rhythm Borderline right axis deviation ST  elev, probable normal early repol pattern Confirmed by [facility] IndianaMES[facility] MD, Barbara CowerJASON  [phone]) on 08/20/2015 5:12:41 PM      MDM   Final diagnoses:  Costochondritis  Panic disorder without agoraphobia   30 y.o. male with multiple ED visits for chest pain. EKG normal and exam benign, CP reproduced with palpation. PERC negative. Suspect costochondritis and anxiety. Will rx diclofenac for MSK component and rec f/u with PCP to discuss SSRI for long term management of anxiety and what sounds like panic disorder.   Abram SanderElena M Adamo, MD 08/20/15 1732  Marily MemosJason Mesner, MD 08/20/15 2330

## 2015-09-22 ENCOUNTER — Encounter (HOSPITAL_BASED_OUTPATIENT_CLINIC_OR_DEPARTMENT_OTHER): Payer: Self-pay | Admitting: *Deleted

## 2015-09-22 ENCOUNTER — Emergency Department (HOSPITAL_BASED_OUTPATIENT_CLINIC_OR_DEPARTMENT_OTHER): Payer: Self-pay

## 2015-09-22 ENCOUNTER — Emergency Department (HOSPITAL_BASED_OUTPATIENT_CLINIC_OR_DEPARTMENT_OTHER)
Admission: EM | Admit: 2015-09-22 | Discharge: 2015-09-22 | Disposition: A | Discharge status: Home / Self Care | Payer: Self-pay | Attending: Emergency Medicine | Admitting: Emergency Medicine

## 2015-09-22 DIAGNOSIS — I10 Essential (primary) hypertension: Secondary | ICD-10-CM | POA: Insufficient documentation

## 2015-09-22 DIAGNOSIS — Z87891 Personal history of nicotine dependence: Secondary | ICD-10-CM | POA: Insufficient documentation

## 2015-09-22 DIAGNOSIS — M25511 Pain in right shoulder: Secondary | ICD-10-CM | POA: Insufficient documentation

## 2015-09-22 MED ORDER — NAPROXEN 500 MG PO TABS: 500.0000 mg | ORAL_TABLET | Freq: Two times a day (BID) | ORAL | Status: DC

## 2015-09-22 MED FILL — NAPROXEN 500 MG TABLET: 500 | 7 days supply | Qty: 14 | Fill #0

## 2015-09-22 NOTE — ED Notes (Signed)
Presents with rt shoulder pain, states was doing push ups, presents with aching and has intermittent increase in pain

## 2015-09-22 NOTE — ED Notes (Signed)
Noted during exam, extension of RUE and raising RUE increases discomfort

## 2015-09-22 NOTE — Discharge Instructions (Signed)
Symptoms seem to be consistent with before meals joint problems of the right shoulder. Wear the sling as directed. Take the Naprosyn as directed for the next 7 days. Make an appointment to follow-up with sports medicine. Work note provided for no use of the right arm for 2 weeks.

## 2015-09-22 NOTE — ED Provider Notes (Signed)
CSN: [id number]     Arrival date & time 09/22/15  [id number] History   First MD Initiated Contact with Patient 09/22/15 [phone]     Chief Complaint  Patient presents with  . Shoulder Pain     (Consider location/radiation/quality/duration/timing/severity/associated sxs/prior Treatment) Patient is a 30 y.o. male presenting with shoulder pain. The history is provided by the patient.  Shoulder Pain Associated symptoms: no back pain, no fever and no neck pain   Patient with complaint of right shoulder pain at the top of the shoulder since Saturday. Is feeling a clicking feeling in that area. Seem to be exacerbated by doing pushups over the weekend. Patient uses his right arm extensively at work for lifting heavy things. No known injury. No history of any problems with the right shoulder in the past. Denies any numbness to the right arm. Patient has discomfort in a clicking feeling when he ranges his right shoulder.  Past Medical History  Diagnosis Date  . Hypertension   . Anxiety    Past Surgical History  Procedure Laterality Date  . Knee surgery     No family history on file. Social History  Substance Use Topics  . Smoking status: Former Smoker    Quit date: 03/02/2014  . Smokeless tobacco: None  . Alcohol Use: 0.0 oz/week    0 Standard drinks or equivalent per week     Comment: occasionally    Review of Systems  Constitutional: Negative for fever.  HENT: Negative for congestion.   Eyes: Negative for redness.  Respiratory: Negative for shortness of breath.   Cardiovascular: Negative for chest pain.  Gastrointestinal: Negative for abdominal pain.  Musculoskeletal: Negative for back pain and neck pain.  Skin: Negative for rash.  Neurological: Negative for numbness and headaches.  Hematological: Does not bruise/bleed easily.  Psychiatric/Behavioral: Negative for confusion.      Allergies  Lisinopril  Home Medications   Prior to Admission medications   Medication Sig Start  Date End Date Taking? Authorizing Provider  amLODipine (NORVASC) 10 MG tablet Take 1 tablet (10 mg total) by mouth daily. 08/01/13   Wendall StadePeter C Nishan, MD  clonazePAM (KLONOPIN) 0.5 MG tablet Take 1 tablet (0.5 mg total) by mouth 2 (two) times daily as needed for anxiety. 04/22/15   John Molpus, MD  diclofenac (VOLTAREN) 75 MG EC tablet Take 1 tablet (75 mg total) by mouth 2 (two) times daily as needed for moderate pain. 08/20/15   Abram SanderElena M Adamo, MD  hydrochlorothiazide (HYDRODIURIL) 25 MG tablet Take 1 tablet (25 mg total) by mouth daily. 06/20/13   Wendall StadePeter C Nishan, MD  naproxen (NAPROSYN) 500 MG tablet Take 1 tablet (500 mg total) by mouth 2 (two) times daily. 09/22/15   Vanetta MuldersScott Natalin Bible, MD   BP 143/98 mmHg  Pulse 90  Temp(Src) 98.9 F (37.2 C) (Oral)  Resp 16  Ht 5\' 5"  (1.651 m)  Wt 97.523 kg  BMI 35.78 kg/m2  SpO2 98% Physical Exam  Constitutional: He is oriented to person, place, and time. He appears well-developed and well-nourished. No distress.  HENT:  Head: Normocephalic and atraumatic.  Mouth/Throat: Oropharynx is clear and moist.  Eyes: Conjunctivae and EOM are normal. Pupils are equal, round, and reactive to light.  Neck: Neck supple.  Cardiovascular: Normal rate, regular rhythm and normal heart sounds.   Pulmonary/Chest: Effort normal and breath sounds normal.  Abdominal: Bowel sounds are normal. There is no tenderness.  Musculoskeletal: He exhibits no tenderness.  Patient with discomfort range of motion  of the right shoulder. No deformity. No tenderness to palpation. Radial pulse distally is 2+. No sensory deficit.  Neurological: He is alert and oriented to person, place, and time. No cranial nerve deficit. He exhibits normal muscle tone. Coordination normal.  Skin: No rash noted.  Nursing note and vitals reviewed.   ED Course  Procedures (including critical care time) Labs Review Labs Reviewed - No data to display  Imaging Review Dg Shoulder Right  09/22/2015  CLINICAL  DATA:  30 year old male with right shoulder pain after exertion this weekend. Initial encounter. EXAM: RIGHT SHOULDER - 2+ VIEW COMPARISON:  Chest radiographs 01/31/2015. FINDINGS: Bone mineralization is within normal limits. No glenohumeral joint dislocation. Proximal right humerus intact. Right scapula and visible right clavicle appear normal and intact. Negative visualized right ribs and lung parenchyma. IMPRESSION: Normal radiographic appearance of the right shoulder. Electronically Signed   By: Odessa Fleming M.D.   On: 09/22/2015 09:08   I have personally reviewed and evaluated these images and lab results as part of my medical decision-making.   EKG Interpretation None      MDM   Final diagnoses:  Shoulder pain, acute, right    The patient's location of the right shoulder pain seems to be at the before meals joint. X-rays are fine but certainly could be a strain sprain of that area. Will treat with a sling and follow-up with sports medicine. Also treat with 7 days and Naprosyn. Work note provided for no use of the right arm for 14 days. X-rays of the area without any acute findings.    Vanetta Mulders, MD 09/22/15 1003

## 2015-09-22 NOTE — ED Notes (Signed)
Only applied one sling not two.

## 2015-09-24 ENCOUNTER — Emergency Department (HOSPITAL_BASED_OUTPATIENT_CLINIC_OR_DEPARTMENT_OTHER)
Admission: EM | Admit: 2015-09-24 | Discharge: 2015-09-24 | Disposition: A | Discharge status: Home / Self Care | Payer: Managed Care, Other (non HMO) | Attending: Emergency Medicine | Admitting: Emergency Medicine

## 2015-09-24 ENCOUNTER — Encounter (HOSPITAL_BASED_OUTPATIENT_CLINIC_OR_DEPARTMENT_OTHER): Payer: Self-pay | Admitting: Emergency Medicine

## 2015-09-24 DIAGNOSIS — Y999 Unspecified external cause status: Secondary | ICD-10-CM | POA: Insufficient documentation

## 2015-09-24 DIAGNOSIS — I10 Essential (primary) hypertension: Secondary | ICD-10-CM | POA: Insufficient documentation

## 2015-09-24 DIAGNOSIS — Y929 Unspecified place or not applicable: Secondary | ICD-10-CM | POA: Insufficient documentation

## 2015-09-24 DIAGNOSIS — Z79899 Other long term (current) drug therapy: Secondary | ICD-10-CM | POA: Insufficient documentation

## 2015-09-24 DIAGNOSIS — Z87891 Personal history of nicotine dependence: Secondary | ICD-10-CM | POA: Insufficient documentation

## 2015-09-24 DIAGNOSIS — X58XXXD Exposure to other specified factors, subsequent encounter: Secondary | ICD-10-CM | POA: Insufficient documentation

## 2015-09-24 DIAGNOSIS — S43101D Unspecified dislocation of right acromioclavicular joint, subsequent encounter: Secondary | ICD-10-CM | POA: Insufficient documentation

## 2015-09-24 DIAGNOSIS — Y93B2 Activity, push-ups, pull-ups, sit-ups: Secondary | ICD-10-CM | POA: Insufficient documentation

## 2015-09-24 MED ORDER — IBUPROFEN 600 MG PO TABS: 600.0000 mg | ORAL_TABLET | Freq: Four times a day (QID) | ORAL | Status: DC | PRN

## 2015-09-24 NOTE — ED Provider Notes (Signed)
CSN: [id number]     Arrival date & time 09/24/15  1632 History   First MD Initiated Contact with Patient 09/24/15 1658     Chief Complaint  Patient presents with  . Shoulder Pain     (Consider location/radiation/quality/duration/timing/severity/associated sxs/prior Treatment) HPI Patient reports he was doing pushups on Saturday when he felt a pop and the pain in his right shoulder. He was seen for this 2 days ago and placed in a sling. Patient reports that he still feels like it moves a lot in a point on the collarbone right in front of the shoulder. He feels like he gets out of position and then he has to push it back down. He reports he's been very uncomfortable at night and not been taking the naproxen because he thought it made him drowsy. No numbness or tingling into the arm or the hand. Past Medical History  Diagnosis Date  . Hypertension   . Anxiety    Past Surgical History  Procedure Laterality Date  . Knee surgery     History reviewed. No pertinent family history. Social History  Substance Use Topics  . Smoking status: Former Smoker    Quit date: 03/02/2014  . Smokeless tobacco: None  . Alcohol Use: 0.0 oz/week    0 Standard drinks or equivalent per week     Comment: occasionally    Review of Systems Constitutional: No fever no chills Respiratory: No chest pain no shortness of breath.   Allergies  Lisinopril  Home Medications   Prior to Admission medications   Medication Sig Start Date End Date Taking? Authorizing Provider  amLODipine (NORVASC) 10 MG tablet Take 1 tablet (10 mg total) by mouth daily. 08/01/13   Wendall StadePeter C Nishan, MD  clonazePAM (KLONOPIN) 0.5 MG tablet Take 1 tablet (0.5 mg total) by mouth 2 (two) times daily as needed for anxiety. 04/22/15   John Molpus, MD  diclofenac (VOLTAREN) 75 MG EC tablet Take 1 tablet (75 mg total) by mouth 2 (two) times daily as needed for moderate pain. 08/20/15   Abram SanderElena M Adamo, MD  hydrochlorothiazide (HYDRODIURIL) 25 MG  tablet Take 1 tablet (25 mg total) by mouth daily. 06/20/13   Wendall StadePeter C Nishan, MD  ibuprofen (ADVIL,MOTRIN) 600 MG tablet Take 1 tablet (600 mg total) by mouth every 6 (six) hours as needed. 09/24/15   Arby BarretteMarcy Kwane Rohl, MD  naproxen (NAPROSYN) 500 MG tablet Take 1 tablet (500 mg total) by mouth 2 (two) times daily. 09/22/15   Vanetta MuldersScott Zackowski, MD   BP 147/93 mmHg  Pulse 99  Resp 20  Ht 5\' 4"  (1.626 m)  Wt 215 lb (97.523 kg)  BMI 36.89 kg/m2  SpO2 100% Physical Exam  Constitutional: He is oriented to person, place, and time. He appears well-developed and well-nourished. No distress.  HENT:  Head: Normocephalic and atraumatic.  Eyes: EOM are normal.  Neck: Neck supple.  Cardiovascular: Normal rate, regular rhythm, normal heart sounds and intact distal pulses.   Pulmonary/Chest: Effort normal and breath sounds normal.  Musculoskeletal:  Patient has point tenderness over the before meals joint on the right. He reports this is a area where it feels like things moving pain originates. No significant soft tissue swelling. The patient is in a sling. He has good 5 out of 5 grip strength and normal neurovascular exam of the hand. Shoulder is not put through general range of motion.  Neurological: He is alert and oriented to person, place, and time. Coordination normal.  Skin: Skin  is warm and dry.  Psychiatric: He has a normal mood and affect.    ED Course  Procedures (including critical care time) Labs Review Labs Reviewed - No data to display  Imaging Review No results found. I have personally reviewed and evaluated these images and lab results as part of my medical decision-making.   EKG Interpretation None      MDM   Final diagnoses:  Acromioclavicular separation, right, subsequent encounter   Patient describes acute pain at the [facility] joint after doing pushups. He was placed in a sling. He complains of continued pain and feeling of things moving out of place. Counseled on the nature of an  AC injury. He also reported he was not taking naproxen because it seemed to make him drowsy. Patient will be given ibuprofen to take instead. He denies any problems with this GI tolerance for NSAIDs. He is advised not to take naproxen if he will be taking the ibuprofen. He voices understanding. Patient is follow-up with sports medicine on Monday. Patient requests a work note until his follow-up appointment. This was given.    Arby Barrette, MD 09/24/15 1723

## 2015-09-24 NOTE — ED Notes (Signed)
Pt was injured last Saturday and was seen and evaluated here on wed, set up to see dr. Blake DivineShane hundal this coming Monday, yesterday patient started having lower posterior shoulder pain associated with shoulder injury which is new since injury. Work is requesting a note stating he can not work if he is not allowed to use right arm, but patient also has new symptom

## 2015-09-24 NOTE — Discharge Instructions (Signed)
Shoulder Separation  A shoulder separation (acromioclavicular separation) is an injury to the connecting tissue (ligament) between the top of your shoulder blade (acromion) and your collarbone (clavicle).  The ligament may be stretched, partially torn, or completely torn.   A stretched ligament may not cause very much pain, and it does not move the collarbone out of place. A stretched ligament looks normal on an X-ray.   An injury that is a bit worse may partially tear a ligament and move the collarbone slightly out of place.   A serious injury completely tears both shoulder ligaments. This moves the collarbone severely out of position and changes the way that the shoulder looks (deformity).  CAUSES  The most common cause of a shoulder separation is falling on or receiving a blow to the top of the shoulder. Falling with an outstretched arm may also cause this injury.  RISK FACTORS  You may be at greater risk of a shoulder separation if:   You are male.   You are younger than age 35.   You play a contact sport, such as football or hockey.  SIGNS AND SYMPTOMS  The most common symptom of a shoulder separation is pain on the top of the shoulder after falling on it or receiving a blow to it. Other signs and symptoms include:   Shoulder deformity.   Swelling of the shoulder.   Decreased ability to move the shoulder.   Bruising on top of the shoulder.  DIAGNOSIS  Your health care provider may suspect a shoulder separation based on your symptoms and the details of a recent injury. A physical exam will be done. During this exam, the health care provider may:   Press on your shoulder.   Test the movement of your shoulder.   Ask you to hold a weight in your hand to see if the separation increases.   Do an X-ray.  TREATMENT   A stretch injury may require only a sling, pain medicine, and cold packs. This treatment may last for 2-12 weeks. You may also have physical therapy. A physical therapist will teach you to  do daily exercises to strengthen your shoulder muscles and prevent stiffness.   A complete tear may require surgery to repair the torn ligament. After surgery, you will also require a sling, pain medicine, and cold packs. Recovery may take longer. You may also need more physical therapy.  HOME CARE INSTRUCTIONS   Take medicines only as directed by your health care provider.   Apply ice to the top of your shoulder:   Put ice in a plastic bag.   Place a towel between your skin and the bag.   Leave the ice on for 20 minutes, 2-3 times a day.   Wear your sling or splint as directed by your health care provider.   You may be able to remove your sling to do your physical therapy exercises.   Ask your health care provider when you can stop wearing the sling.   Do not do any activities that make your pain worse.   Do not lift anything that is heavier than 10 lb (4.5 kg) on the injured side of your body.   Ask your health care provider when you can return to athletic activities.  SEEK MEDICAL CARE IF:   Your pain medicine is not relieving your pain.   Your pain and stiffness are not improving after 2 weeks.   You are unable to do your physical therapy exercises because   of pain or stiffness.     This information is not intended to replace advice given to you by your health care provider. Make sure you discuss any questions you have with your health care provider.     Document Released: 02/08/2005 Document Revised: 05/22/2014 Document Reviewed: 10/01/2013  Elsevier Interactive Patient Education 2016 [employer]

## 2015-09-27 ENCOUNTER — Ambulatory Visit (INDEPENDENT_AMBULATORY_CARE_PROVIDER_SITE_OTHER): Payer: Managed Care, Other (non HMO) | Admitting: Family Medicine

## 2015-09-27 ENCOUNTER — Encounter: Payer: Self-pay | Admitting: Family Medicine

## 2015-09-27 VITALS — BP 119/82 | HR 71 | Ht 64.0 in | Wt 211.0 lb

## 2015-09-27 DIAGNOSIS — S4991XA Unspecified injury of right shoulder and upper arm, initial encounter: Secondary | ICD-10-CM

## 2015-09-27 NOTE — Patient Instructions (Signed)
You have a shoulder separation (AC sprain, Grade 2) Use the sling for comfort Ice the area 3-4 times a day for 15 minutes at a time Naproxen 500mg  twice a day with food - after you run out of this you can take over the counter aleve 2 tabs twice a day with food. Consider stronger medications if pain not controlled with these (hydrocodone, other anti-inflammatory). Range of motion exercises when tolerated (arm circles, wall walking, pendulum) Follow up with me in 2 1/2 weeks. Avoid overhead motions, bench press, pushups, crossing over your body as much as you can.

## 2015-09-28 DIAGNOSIS — S4991XA Unspecified injury of right shoulder and upper arm, initial encounter: Secondary | ICD-10-CM | POA: Insufficient documentation

## 2015-09-28 HISTORY — DX: Unspecified injury of right shoulder and upper arm, initial encounter: S49.91XA

## 2015-09-28 NOTE — Progress Notes (Signed)
PCP: Nathan Nelson, WILLIAM, MD  Subjective:   HPI: Patient is a 30 y.o. male here for right shoulder pain.  Patient reports on 5/6 he was doing pushups and felt like his shoulder 'went out of place' superiorly. Immediate pain, some swelling locally. Pain is sharp, still 8/10 level here. Has been wearing sling, icing, and taking naproxen. No skin changes, numbness, radiation. No prior injuries.  Past Medical History  Diagnosis Date  . Hypertension   . Anxiety     Current Outpatient Prescriptions on File Prior to Visit  Medication Sig Dispense Refill  . amLODipine (NORVASC) 10 MG tablet Take 1 tablet (10 mg total) by mouth daily. 30 tablet 0  . clonazePAM (KLONOPIN) 0.5 MG tablet Take 1 tablet (0.5 mg total) by mouth 2 (two) times daily as needed for anxiety. 20 tablet 0  . diclofenac (VOLTAREN) 75 MG EC tablet Take 1 tablet (75 mg total) by mouth 2 (two) times daily as needed for moderate pain. 60 tablet 0  . hydrochlorothiazide (HYDRODIURIL) 25 MG tablet Take 1 tablet (25 mg total) by mouth daily. 30 tablet 1  . ibuprofen (ADVIL,MOTRIN) 600 MG tablet Take 1 tablet (600 mg total) by mouth every 6 (six) hours as needed. 30 tablet 0  . naproxen (NAPROSYN) 500 MG tablet Take 1 tablet (500 mg total) by mouth 2 (two) times daily. 14 tablet 0   No current facility-administered medications on file prior to visit.    Past Surgical History  Procedure Laterality Date  . Knee surgery      Allergies  Allergen Reactions  . Lisinopril Other (See Comments)    Tightness in chest    Social History   Social History  . Marital Status: Legally Separated    Spouse Name: N/A  . Number of Children: N/A  . Years of Education: N/A   Occupational History  . Not on file.   Social History Main Topics  . Smoking status: Former Smoker    Quit date: 03/02/2014  . Smokeless tobacco: Not on file  . Alcohol Use: 0.0 oz/week    0 Standard drinks or equivalent per week     Comment: occasionally  .  Drug Use: No  . Sexual Activity: Not on file   Other Topics Concern  . Not on file   Social History Narrative    No family history on file.  BP 119/82 mmHg  Pulse 71  Ht 5\' 4"  (1.626 m)  Wt 211 lb (95.709 kg)  BMI 36.20 kg/m2  Review of Systems: See HPI above.    Objective:  Physical Exam:  Gen: NAD, comfortable in exam room  Right shoulder: Mild swelling over AC joint.  No bruising, other deformity. TTP AC joint.  No other tenderness. Full ER, IR.  Pain with abduction, flexion, adduction. Painful Hawkins, negative Neers. Negative Speeds, Yergasons. Strength 5/5 with empty can and resisted internal/external rotation. Negative apprehension. NV intact distally.  Left shoulder: FROM without pain.    Assessment & Plan:  1. Right shoulder injury - consistent with AC sprain.  Independently reviewed radiographs and these are normal.  Suggests likely grade 2 injury here.  Sling, icing with naproxen.  ROM exercises as tolerated.  F/u in 2 1/2 weeks.

## 2015-09-28 NOTE — Assessment & Plan Note (Signed)
consistent with AC sprain.  Independently reviewed radiographs and these are normal.  Suggests likely grade 2 injury here.  Sling, icing with naproxen.  ROM exercises as tolerated.  F/u in 2 1/2 weeks.

## 2015-10-12 ENCOUNTER — Encounter: Payer: Self-pay | Admitting: Family Medicine

## 2015-10-12 ENCOUNTER — Ambulatory Visit (INDEPENDENT_AMBULATORY_CARE_PROVIDER_SITE_OTHER): Payer: Self-pay | Admitting: Family Medicine

## 2015-10-12 ENCOUNTER — Telehealth: Payer: Self-pay | Admitting: Family Medicine

## 2015-10-12 VITALS — BP 143/91 | HR 85 | Ht 65.0 in | Wt 215.0 lb

## 2015-10-12 DIAGNOSIS — M25511 Pain in right shoulder: Secondary | ICD-10-CM

## 2015-10-12 DIAGNOSIS — S4991XD Unspecified injury of right shoulder and upper arm, subsequent encounter: Secondary | ICD-10-CM

## 2015-10-12 NOTE — Progress Notes (Signed)
PCP: Johny Blamer, MD  Subjective:   HPI: Patient is a 30 y.o. male here for right shoulder pain.  5/15: Patient reports on 5/6 he was doing pushups and felt like his shoulder 'went out of place' superiorly. Immediate pain, some swelling locally. Pain is sharp, still 8/10 level here. Has been wearing sling, icing, and taking naproxen. No skin changes, numbness, radiation. No prior injuries.  5/30: Patient reports his pain has improved down to 4/10, less sharp. Still bad when moving shoulder in any direction. Still pops and feels like it is coming out of place superiorly. Using sling, icing, taking naproxen. No skin changes, numbness.  Past Medical History  Diagnosis Date  . Hypertension   . Anxiety     Current Outpatient Prescriptions on File Prior to Visit  Medication Sig Dispense Refill  . amLODipine (NORVASC) 10 MG tablet Take 1 tablet (10 mg total) by mouth daily. 30 tablet 0  . clonazePAM (KLONOPIN) 0.5 MG tablet Take 1 tablet (0.5 mg total) by mouth 2 (two) times daily as needed for anxiety. 20 tablet 0  . diclofenac (VOLTAREN) 75 MG EC tablet Take 1 tablet (75 mg total) by mouth 2 (two) times daily as needed for moderate pain. 60 tablet 0  . hydrochlorothiazide (HYDRODIURIL) 25 MG tablet Take 1 tablet (25 mg total) by mouth daily. 30 tablet 1  . ibuprofen (ADVIL,MOTRIN) 600 MG tablet Take 1 tablet (600 mg total) by mouth every 6 (six) hours as needed. 30 tablet 0  . naproxen (NAPROSYN) 500 MG tablet Take 1 tablet (500 mg total) by mouth 2 (two) times daily. 14 tablet 0   No current facility-administered medications on file prior to visit.    Past Surgical History  Procedure Laterality Date  . Knee surgery      Allergies  Allergen Reactions  . Lisinopril Other (See Comments)    Tightness in chest    Social History   Social History  . Marital Status: Legally Separated    Spouse Name: N/A  . Number of Children: N/A  . Years of Education: N/A    Occupational History  . Not on file.   Social History Main Topics  . Smoking status: Former Smoker    Quit date: 03/02/2014  . Smokeless tobacco: Not on file  . Alcohol Use: 0.0 oz/week    0 Standard drinks or equivalent per week     Comment: occasionally  . Drug Use: No  . Sexual Activity: Not on file   Other Topics Concern  . Not on file   Social History Narrative    No family history on file.  BP 143/91 mmHg  Pulse 85  Ht  (1.651 m)  Wt 215 lb (97.523 kg)  BMI 35.78 kg/m2  Review of Systems: See HPI above.    Objective:  Physical Exam:  Gen: NAD, comfortable in exam room  Right shoulder: Mild swelling over AC joint.  No bruising, other deformity. TTP AC joint.  No other tenderness. Full ER, IR.  Pain with abduction, flexion, adduction but able to flex and abduct to 150 degrees comfortably. Painful Hawkins, negative Neers. Strength 5/5 with empty can and resisted internal/external rotation. Negative apprehension. Positive crossover adduction. NV intact distally.  Left shoulder: FROM without pain.    Assessment & Plan:  1. Right shoulder injury - consistent with AC sprain.  3 1/2 weeks out now.  Some improvement since last visit but still feels unstable per patient.  Will wean out of sling  and start physical therapy to work on strengthening, regaining full motion.  Reevaluate in 2-3 weeks.  If not continuing to improve will consider MRI.  Aleve or ibuprofen if needed.

## 2015-10-12 NOTE — Assessment & Plan Note (Signed)
consistent with AC sprain.  3 1/2 weeks out now.  Some improvement since last visit but still feels unstable per patient.  Will wean out of sling and start physical therapy to work on strengthening, regaining full motion.  Reevaluate in 2-3 weeks.  If not continuing to improve will consider MRI.  Aleve or ibuprofen if needed.

## 2015-10-12 NOTE — Telephone Encounter (Signed)
Letter printed.

## 2015-10-12 NOTE — Patient Instructions (Signed)
You are improving but not as quickly as we would like.   Start physical therapy. Try to wean out of the sling (I'd still wear this at work though). Aleve or ibuprofen only as needed. Follow up with me in 2-3 weeks for reevaluation. I would consider an MRI if you're not improving as expected.

## 2015-10-21 ENCOUNTER — Encounter: Payer: Self-pay | Admitting: Rehabilitative and Restorative Service Providers"

## 2015-10-21 ENCOUNTER — Ambulatory Visit: Payer: Self-pay | Attending: Family Medicine | Admitting: Rehabilitative and Restorative Service Providers"

## 2015-10-21 DIAGNOSIS — M791 Myalgia, unspecified site: Secondary | ICD-10-CM

## 2015-10-21 DIAGNOSIS — M25511 Pain in right shoulder: Secondary | ICD-10-CM | POA: Insufficient documentation

## 2015-10-21 DIAGNOSIS — R293 Abnormal posture: Secondary | ICD-10-CM | POA: Insufficient documentation

## 2015-10-21 NOTE — Therapy (Addendum)
[facility] [address]  [place], [place], [address] Phone: [phone]   Fax:  [phone]  Physical Therapy Evaluation  Patient Details  Name: Nathan Nelson MRN: [id number] Date of Birth: February 10, 1986 Referring Provider: Dr. Karlton Lemon  Encounter Date: 10/21/2015      PT End of Session - 10/21/15 1720    Visit Number 1   Number of Visits 6   Date for PT Re-Evaluation 12/02/15   PT Start Time 1627   PT Stop Time 1722   PT Time Calculation (min) 55 min   Activity Tolerance Patient tolerated treatment well      Past Medical History  Diagnosis Date  . Hypertension   . Anxiety     Past Surgical History  Procedure Laterality Date  . Knee surgery      There were no vitals filed for this visit.       Subjective Assessment - 10/21/15 1625    Subjective Patient reports injury to Lt shoudler 5/6 while doing push ups. Felt a pop and heard a "cracking sound". He was placed in a sling for 2 weeks 24/7 and now wears the sling at work. He reports that his shoulder "comes out of place" with certain movements including reaching across body; out to side; at night when sleeping.    Pertinent History Pain in ribs - soreness and aching pain in the ribs and sternum   How long can you sit comfortably? no limit   How long can you stand comfortably? no limit   How long can you walk comfortably? no limit   Diagnostic tests xrays - dislocation Rt AC joint    Patient Stated Goals get shoudler stronger and be able to use it like normal    Currently in Pain? Yes   Pain Score 3    Pain Location Shoulder   Pain Orientation Right   Pain Descriptors / Indicators Aching   Pain Radiating Towards sometimes into neck and upper back on Rt    Pain Onset More than a month ago   Pain Frequency Intermittent   Aggravating Factors  reaching across body or out to the side; lying to sleep; has not tried any lifting    Pain Relieving  Factors rest; OTC antiiflammatory meds; sling            [facility] PT Assessment - 10/21/15 0001    Assessment   Medical Diagnosis Rt shoulder pain - AC joint seperation    Referring Provider Dr. Karlton Lemon   Onset Date/Surgical Date 09/18/15   Hand Dominance Right   Next MD Visit 2-3 weeks    Prior Therapy none   Precautions   Precaution Comments in sling at work    Balance Screen   Has the patient fallen in the past 6 months No   Has the patient had a decrease in activity level because of a fear of falling?  No   Is the patient reluctant to leave their home because of a fear of falling?  No   Prior Function   Level of Independence Independent   Vocation Full time employment   Vocation Requirements cleaning - bending; light lifting; reaching; walking - offices    Leisure sedentary; Play station 4 stream games on you tube; light houshold chores; laundry; some cooking - in gym beofre injury to shoudler - 2 days - machines and free weights (big weights)    Observation/Other Assessments   Focus on  Therapeutic Outcomes (FOTO)  59% limitation    Sensation   Additional Comments WFL's    Posture/Postural Control   Posture Comments head forward; shoulders rounded and elevated; head of the humerus anterior in orientation; scapulae abducted and rotated along the thoracic wall    AROM   Right Shoulder Extension 54 Degrees   Right Shoulder Flexion 145 Degrees  sore   Right Shoulder ABduction 152 Degrees  sore   Right Shoulder Internal Rotation 39 Degrees   Right Shoulder External Rotation 80 Degrees   Left Shoulder Extension 50 Degrees   Left Shoulder Flexion 170 Degrees   Left Shoulder ABduction 174 Degrees   Left Shoulder Internal Rotation 36 Degrees   Left Shoulder External Rotation 112 Degrees   Cervical Flexion 50   Cervical Extension 47   Cervical - Right Side Bend 45  pull   Cervical - Left Side Bend 40  pull   Cervical - Right Rotation 63  pull   Cervical - Left  Rotation 73   Strength   Overall Strength Comments Rt shd flex/abd 4+/5; IR/ER 5-/5 likely due to discomfort through the shoulder; 5/5 Lt UE    Palpation   Spinal mobility pain with CPA mobs thoracic spine    Palpation comment tightness and tenderness with spring testing through costocondral joints ant chest Lt > Rt; lower ribs Rt > Lt; zyphoid process; musculature - upper trap; pecs Rt; AC joint Rt                    [facility] Adult PT Treatment/Exercise - 10/21/15 0001    Neuro Re-ed    Neuro Re-ed Details  working on posture and alignment   Exercises   Exercises --  exercises per HEP    Cryotherapy   Number Minutes Cryotherapy 12 Minutes   Cryotherapy Location Shoulder  Rt   Type of Cryotherapy Ice pack                PT Education - 10/21/15 1719    Education provided Yes   Education Details HEP; TENS unit   Person(s) Educated Patient   Methods Explanation;Demonstration;Tactile cues;Verbal cues;Handout   Comprehension Verbalized understanding;Returned demonstration;Verbal cues required;Tactile cues required             PT Long Term Goals - 10/21/15 1725    PT LONG TERM GOAL #1   Title Improve posture and alignment with patient to demonstrate improved upright posture 11/02/15   Time 6   Period Weeks   Status New   PT LONG TERM GOAL #2   Title Full painfree ROM Rt shoulder 12/02/15   Time 6   Period Weeks   Status New   PT LONG TERM GOAL #3   Title 5/5 strength Rt shoulder 12/02/15   Time 6   Period Weeks   Status New   PT LONG TERM GOAL #4   Title Independent in HEP 12/02/15   Time 6   Period Days   Status New   PT LONG TERM GOAL #5   Title Improve FOTO to < 32% limitation 12/02/15   Time 6   Period Weeks   Status New               Plan - 10/21/15 1722    Clinical Impression Statement Patient presents with Rt shoulder pain following AC seperation 09/18/15. He hs poor posture and alignment; limited cervical and Rt shoulder ROM;  decreased strength Rt UE; pain through sternum/ribs/thoracic spine/Rt shoulder.  Rehab Potential Good   PT Frequency 1x / week   PT Duration 6 weeks   PT Treatment/Interventions Patient/family education;ADLs/Self Care Home Management;Cryotherapy;Electrical Stimulation;Moist Heat;Ultrasound;Dry needling;Therapeutic activities;Therapeutic exercise   PT Next Visit Plan progress with postural correction; ROM; strengthening   Consulted and Agree with Plan of Care Patient      Patient will benefit from skilled therapeutic intervention in order to improve the following deficits and impairments:  Postural dysfunction, Improper body mechanics, Increased muscle spasms, Decreased strength, Decreased mobility, Decreased range of motion, Decreased endurance, Decreased activity tolerance, Pain  Visit Diagnosis: Pain in right shoulder - Plan: PT plan of care cert/re-cert  Abnormal posture - Plan: PT plan of care cert/re-cert  Myalgia - Plan: PT plan of care cert/re-cert     Problem List Patient Active Problem List   Diagnosis Date Noted  . Right shoulder injury 09/28/2015  . Costochondritis 06/11/2014  . URI (upper respiratory infection) 05/30/2012  . Hypertension     Celyn Nilda Simmer PT, MPH  10/21/2015, 5:32 PM  [facility] [address]  [place], [place], [address] Phone: [phone]   Fax:  [phone]  Name: RUBEL HECKARD MRN: [id number] Date of Birth: 1986-01-31   PHYSICAL THERAPY DISCHARGE SUMMARY  Visits from Start of Care: Evaluation only  Current functional level related to goals / functional outcomes: Unchanged - one visit only    Remaining deficits: Unchanged    Education / Equipment: HEP  Plan: Patient agrees to discharge.  Patient goals were partially met. Patient is being discharged due to not returning since the last visit.  ?????     Patient was instructed in initial HEP. He has no medical  insurance and was not sure he could afford to come for PT. Left message 10/31/15 asking if patient wished to re-schedule. No return call. Patient will be discharged form PT at this time.   Celyn P. Helene Kelp PT, MPH 11/18/2015 10:38 AM

## 2015-10-21 NOTE — Patient Instructions (Signed)
Avoid sitting bent forward - like playing games   SUPINE Tips A    Being in the supine position means to be lying on the back. Lying on the back is the position of least compression on the bones and discs of the spine, and helps to re-align the natural curves of the back. Lying with arms at side comfortable working toward 5 minutes; bringing arms up toward 90 degrees and then 120 degrees    Thoracic Lift    Press shoulders down. Then lift mid-thoracic spine (area between the shoulder blades). Lift the breastbone slightly. Hold _10__ seconds. Relax. Repeat _10__ times.  Shoulder Blade Squeeze    Rotate shoulders back, then squeeze shoulder blades down and back. Hold 10 sec Repeat ___10_ times. Do __several __ sessions per day. Can use swim noodle along spine for tactile cue.  Resisted External Rotation: in Neutral - Bilateral   PALMS UP Sit or stand, tubing in both hands, elbows at sides, bent to 90, forearms forward. Pinch shoulder blades together and rotate forearms out. Keep elbows at sides. Repeat __10__ times per set. Do _2-3___ sets per session. Do _2-3___ sessions per day.   Low Row: Standing   Face anchor, feet shoulder width apart. Palms up, pull arms back, squeezing shoulder blades together. Repeat 10__ times per set. Do 2-3__ sets per session. Do 2-3__ sessions per week. Anchor Height: Waist   Strengthening: Resisted Extension   Hold tubing in right hand, arm forward. Pull arm back, elbow straight. Repeat _10___ times per set. Do 2-3____ sets per session. Do 2-3____ sessions per day.   External Rotator Cuff Stretch, Standing    Stand, palm against door frame and elbow bent at 90. Turn body away from fixed hand allowing shoulder to come forward. Hold _20-30__ seconds.  Repeat __3_ times per session. Do __2-3_ sessions per day.    Flexors Stretch, Standing    Stand near wall and slide arm up, with palm facing away from wall, by leaning toward  wall. Hold _10-20__ seconds.  Repeat _3-5__ times per session. Do _2-3__ sessions per day.    Internal Rotator Cuff Stretch, Standing    Lying on back hands clasped behind head. Pull elbows back as far as possible. Hold _10-20 - 30 __ seconds. Repeat _2-3__ times per session. Do _2-3__ sessions per day.   TENS UNIT: This is helpful for muscle pain and spasm.   Search and Purchase a TENS 7000 2nd edition at www.tenspros.com. It should be less than $30.     TENS unit instructions: Do not shower or bathe with the unit on Turn the unit off before removing electrodes or batteries If the electrodes lose stickiness add a drop of water to the electrodes after they are disconnected from the unit and place on plastic sheet. If you continued to have difficulty, call the TENS unit company to purchase more electrodes. Do not apply lotion on the skin area prior to use. Make sure the skin is clean and dry as this will help prolong the life of the electrodes. After use, always check skin for unusual red areas, rash or other skin difficulties. If there are any skin problems, does not apply electrodes to the same area. Never remove the electrodes from the unit by pulling the wires. Do not use the TENS unit or electrodes other than as directed. Do not change electrode placement without consultating your therapist or physician. Keep 2 fingers with between each electrode.

## 2015-11-05 ENCOUNTER — Encounter: Payer: Self-pay | Admitting: Family Medicine

## 2015-11-05 ENCOUNTER — Ambulatory Visit (INDEPENDENT_AMBULATORY_CARE_PROVIDER_SITE_OTHER): Payer: Self-pay | Admitting: Family Medicine

## 2015-11-05 VITALS — BP 134/87 | HR 103 | Ht 65.0 in | Wt 215.0 lb

## 2015-11-05 DIAGNOSIS — S4991XD Unspecified injury of right shoulder and upper arm, subsequent encounter: Secondary | ICD-10-CM

## 2015-11-05 MED ORDER — METHYLPREDNISOLONE ACETATE 40 MG/ML IJ SUSP
40.0000 mg | Freq: Once | INTRAMUSCULAR | Status: AC
  Administered 2015-11-05: 20 mg via INTRA_ARTICULAR

## 2015-11-05 NOTE — Patient Instructions (Signed)
You are improving but not as quickly as we would like.   Continue the home exercises - I'd wait a week before restarting these. Stop using sling unless absolutely needed. Aleve or ibuprofen only as needed. You were given a cortisone shot today. Call me if you want to go ahead with MRI. Otherwise follow up with me in 5-6 weeks.

## 2015-11-09 NOTE — Assessment & Plan Note (Signed)
consistent with AC sprain.  6 weeks out and healing lagging from where I expect it.  We discussed options - [facility]  NSAIDs as needed.  Consider MRI if not improving.  F/u in 5-6 weeks.  After informed written consent patient was seated in chair in exam room.  Right AC joint identified by ultrasound, prepped with alcohol swab, and injected with 0.5:0.115mL marcaine: depomedrol.  Patient tolerated procedure well without immediate complications.

## 2015-11-09 NOTE — Progress Notes (Signed)
PCP: Johny BlamerHARRIS, WILLIAM, MD  Subjective:   HPI: Patient is a 30 y.o. male here for right shoulder pain.  5/15: Patient reports on 5/6 he was doing pushups and felt like his shoulder 'went out of place' superiorly. Immediate pain, some swelling locally. Pain is sharp, still 8/10 level here. Has been wearing sling, icing, and taking naproxen. No skin changes, numbness, radiation. No prior injuries.  5/30: Patient reports his pain has improved down to 4/10, less sharp. Still bad when moving shoulder in any direction. Still pops and feels like it is coming out of place superiorly. Using sling, icing, taking naproxen. No skin changes, numbness.  6/23: Patient has been improving but slowly. Still feels like shoulder pops out of place with certain movements superiorly. Pain level 4/10, still sharp superior shoulder. Ok at rest. No longer using sling. Icing, takes naproxen if needed. No skin changes, numbness.  Past Medical History  Diagnosis Date  . Hypertension   . Anxiety     Current Outpatient Prescriptions on File Prior to Visit  Medication Sig Dispense Refill  . amLODipine (NORVASC) 10 MG tablet Take 1 tablet (10 mg total) by mouth daily. 30 tablet 0  . clonazePAM (KLONOPIN) 0.5 MG tablet Take 1 tablet (0.5 mg total) by mouth 2 (two) times daily as needed for anxiety. 20 tablet 0  . diclofenac (VOLTAREN) 75 MG EC tablet Take 1 tablet (75 mg total) by mouth 2 (two) times daily as needed for moderate pain. 60 tablet 0  . hydrochlorothiazide (HYDRODIURIL) 25 MG tablet Take 1 tablet (25 mg total) by mouth daily. 30 tablet 1  . ibuprofen (ADVIL,MOTRIN) 600 MG tablet Take 1 tablet (600 mg total) by mouth every 6 (six) hours as needed. 30 tablet 0  . naproxen (NAPROSYN) 500 MG tablet Take 1 tablet (500 mg total) by mouth 2 (two) times daily. 14 tablet 0   No current facility-administered medications on file prior to visit.    Past Surgical History  Procedure Laterality Date  .  Knee surgery      Allergies  Allergen Reactions  . Lisinopril Other (See Comments)    Tightness in chest    Social History   Social History  . Marital Status: Legally Separated    Spouse Name: N/A  . Number of Children: N/A  . Years of Education: N/A   Occupational History  . Not on file.   Social History Main Topics  . Smoking status: Former Smoker    Quit date: 03/02/2014  . Smokeless tobacco: Not on file  . Alcohol Use: 0.0 oz/week    0 Standard drinks or equivalent per week     Comment: occasionally  . Drug Use: No  . Sexual Activity: Not on file   Other Topics Concern  . Not on file   Social History Narrative    No family history on file.  BP 134/87 mmHg  Pulse 103  Ht 5\' 5"  (1.651 m)  Wt 215 lb (97.523 kg)  BMI 35.78 kg/m2  Review of Systems: See HPI above.    Objective:  Physical Exam:  Gen: NAD, comfortable in exam room  Right shoulder: Mild swelling over AC joint.  No bruising, other deformity. TTP AC joint.  No other tenderness. Full ER, IR.  Pain with abduction, flexion, adduction but able to flex and abduct to 150 degrees comfortably. Painful Hawkins, negative Neers. Strength 5/5 with empty can and resisted internal/external rotation. Negative apprehension. Positive crossover adduction. NV intact distally.  Left shoulder:  FROM without pain.    Assessment & Plan:  1. Right shoulder injury - consistent with AC sprain.  6 weeks out and healing lagging from where I expect it.  We discussed [facility]  NSAIDs as needed.  Consider MRI if not improving.  F/u in 5-6 weeks.  After informed written consent patient was seated in chair in exam room.  Right AC joint identified by ultrasound, prepped with alcohol swab, and injected with 0.5:0.745mL marcaine: depomedrol.  Patient tolerated procedure well without immediate complications.

## 2015-12-06 ENCOUNTER — Emergency Department (HOSPITAL_BASED_OUTPATIENT_CLINIC_OR_DEPARTMENT_OTHER)
Admission: EM | Admit: 2015-12-06 | Discharge: 2015-12-06 | Disposition: A | Discharge status: Home / Self Care | Payer: BLUE CROSS/BLUE SHIELD | Attending: Emergency Medicine | Admitting: Emergency Medicine

## 2015-12-06 ENCOUNTER — Encounter (HOSPITAL_BASED_OUTPATIENT_CLINIC_OR_DEPARTMENT_OTHER): Payer: Self-pay

## 2015-12-06 DIAGNOSIS — I10 Essential (primary) hypertension: Secondary | ICD-10-CM | POA: Diagnosis present

## 2015-12-06 DIAGNOSIS — Z87891 Personal history of nicotine dependence: Secondary | ICD-10-CM | POA: Insufficient documentation

## 2015-12-06 DIAGNOSIS — F419 Anxiety disorder, unspecified: Secondary | ICD-10-CM | POA: Insufficient documentation

## 2015-12-06 MED ORDER — METOPROLOL SUCCINATE ER 25 MG PO TB24: 25.0000 mg | ORAL_TABLET | Freq: Every day | ORAL | 0 refills | Status: DC

## 2015-12-06 NOTE — ED Triage Notes (Addendum)
C/o elevated BP today at PCP (new pt) and at work-pt states hx HTN and is compliant with BP meds-feeling "tight when I breathe" and HA earlier-denies pain at present-NAD-steady gait

## 2015-12-06 NOTE — ED Notes (Signed)
Pt provided with urinal

## 2015-12-06 NOTE — ED Notes (Signed)
Patient states that he went to see his PCP today and that his blood pressure was elevated there. He states that when he found this out, he started feeling anxious and that his chest got tight. He also reports having blurring vision at his PCP. At this time patient reports that his vision is normal and that he is feeling less anxious.

## 2015-12-06 NOTE — ED Notes (Signed)
Patient ambulatory to discharge desk. NAD noted. Prescription given for metoprolol.

## 2015-12-06 NOTE — ED Provider Notes (Signed)
[facility] Provider Note   CSN: [id number] Arrival date & time: 12/06/15  1304  First Provider Contact:  13:20 p.m.      History   Chief Complaint Chief Complaint  Patient presents with  . Hypertension    HPI Nathan Nelson is a 30 y.o. male with a history of hypertension. He's been on amlodipine 10, and hydromorphone is a 25 past year. Went to see his primary care physician today. He states that he became anxious because his blood pressure was high this is not unusual for him. He presents here stating that he was given a prescription for "something they told me was like lisinopril". I'm uncertain if this was an ARB. However he states he went to the pharmacy to get it filled and they would not fill it stating that they were concerned that he would have a reaction to it.   He went on to work. He states that he felt anxious about his blood pressure being high and had it checked at work and it was still high and he became more anxious and presents here. He is having no chest pain vision changes short of breath or any symptoms now. He felt anxious at work, this is improved  HPI  Past Medical History:  Diagnosis Date  . Anxiety   . Hypertension     Patient Active Problem List   Diagnosis Date Noted  . Right shoulder injury 09/28/2015  . Costochondritis 06/11/2014  . URI (upper respiratory infection) 05/30/2012  . Hypertension     Past Surgical History:  Procedure Laterality Date  . KNEE SURGERY         Home Medications    Prior to Admission medications   Medication Sig Start Date End Date Taking? Authorizing Provider  amLODipine (NORVASC) 10 MG tablet Take 1 tablet (10 mg total) by mouth daily. 08/01/13   Wendall Stade, MD  clonazePAM (KLONOPIN) 0.5 MG tablet Take 1 tablet (0.5 mg total) by mouth 2 (two) times daily as needed for anxiety. 04/22/15   John Molpus, MD  hydrochlorothiazide (HYDRODIURIL) 25 MG tablet Take 1 tablet (25 mg total) by mouth daily.  06/20/13   Wendall Stade, MD  metoprolol succinate (TOPROL-XL) 25 MG 24 hr tablet Take 1 tablet (25 mg total) by mouth daily. 12/06/15   Rolland Porter, MD    Family History No family history on file.  Social History Social History  Substance Use Topics  . Smoking status: Former Smoker    Quit date: 03/02/2014  . Smokeless tobacco: Never Used  . Alcohol use No     Allergies   Lisinopril   Review of Systems Review of Systems  Constitutional: Negative for appetite change, chills, diaphoresis, fatigue and fever.  HENT: Negative for mouth sores, sore throat and trouble swallowing.   Eyes: Negative for visual disturbance.  Respiratory: Negative for cough, chest tightness, shortness of breath and wheezing.   Cardiovascular: Negative for chest pain.  Gastrointestinal: Negative for abdominal distention, abdominal pain, diarrhea, nausea and vomiting.  Endocrine: Negative for polydipsia, polyphagia and polyuria.  Genitourinary: Negative for dysuria, frequency and hematuria.  Musculoskeletal: Negative for gait problem.  Skin: Negative for color change, pallor and rash.  Neurological: Negative for dizziness, syncope, light-headedness and headaches.  Hematological: Does not bruise/bleed easily.  Psychiatric/Behavioral: Negative for behavioral problems and confusion.     Physical Exam Updated Vital Signs BP (!) 158/102 (BP Location: Left Arm)   Pulse 109   Temp 99.1 F (  37.3 C) (Oral)   Resp 18   Ht  (1.651 m)   Wt 209 lb (94.8 kg)   SpO2 98%   BMI 34.78 kg/m   Physical Exam  Constitutional: He is oriented to person, place, and time. He appears well-developed and well-nourished. No distress.  HENT:  Head: Normocephalic.  Eyes: Conjunctivae are normal. Pupils are equal, round, and reactive to light. No scleral icterus.  Neck: Normal range of motion. Neck supple. No thyromegaly present.  Cardiovascular: Normal rate and regular rhythm.  Exam reveals no gallop and no friction  rub.   No murmur heard. Pulmonary/Chest: Effort normal and breath sounds normal. No respiratory distress. He has no wheezes. He has no rales.  Abdominal: Soft. Bowel sounds are normal. He exhibits no distension. There is no tenderness. There is no rebound.  Musculoskeletal: Normal range of motion.  Neurological: He is alert and oriented to person, place, and time.  Skin: Skin is warm and dry. No rash noted.  Psychiatric: He has a normal mood and affect. His behavior is normal.     ED Treatments / Results  Labs (all labs ordered are listed, but only abnormal results are displayed) Labs Reviewed - No data to display  EKG  EKG Interpretation None       Radiology No results found.  Procedures Procedures (including critical care time)  Medications Ordered in ED Medications - No data to display   Initial Impression / Assessment and Plan / ED Course  I have reviewed the triage vital signs and the nursing notes.  Pertinent labs & imaging results that were available during my care of the patient were reviewed by me and considered in my medical decision making (see chart for details).  Clinical Course    EKG with early re-pole. No obvious LVH changes. His cast adding Lopressor to his medicine regimen. His resting heart rates in the 90s. He states he does check his pressure at home about once per month and is typically over 140 and over 90.  Final Clinical Impressions(s) / ED Diagnoses   Final diagnoses:  Essential hypertension    New Prescriptions New Prescriptions   METOPROLOL SUCCINATE (TOPROL-XL) 25 MG 24 HR TABLET    Take 1 tablet (25 mg total) by mouth daily.     Rolland Porter, MD 12/06/15 [phone]

## 2015-12-15 ENCOUNTER — Ambulatory Visit: Payer: BLUE CROSS/BLUE SHIELD | Admitting: Family Medicine

## 2015-12-21 ENCOUNTER — Ambulatory Visit: Payer: BLUE CROSS/BLUE SHIELD | Admitting: Family Medicine

## 2016-02-09 ENCOUNTER — Ambulatory Visit (HOSPITAL_COMMUNITY)
Admission: EM | Admit: 2016-02-09 | Discharge: 2016-02-09 | Disposition: A | Discharge status: Home / Self Care | Payer: BLUE CROSS/BLUE SHIELD | Attending: Emergency Medicine | Admitting: Emergency Medicine

## 2016-02-09 ENCOUNTER — Encounter (HOSPITAL_COMMUNITY): Payer: Self-pay | Admitting: *Deleted

## 2016-02-09 DIAGNOSIS — Z79899 Other long term (current) drug therapy: Secondary | ICD-10-CM | POA: Insufficient documentation

## 2016-02-09 DIAGNOSIS — R0981 Nasal congestion: Secondary | ICD-10-CM | POA: Insufficient documentation

## 2016-02-09 DIAGNOSIS — Z888 Allergy status to other drugs, medicaments and biological substances status: Secondary | ICD-10-CM | POA: Diagnosis not present

## 2016-02-09 DIAGNOSIS — F419 Anxiety disorder, unspecified: Secondary | ICD-10-CM | POA: Insufficient documentation

## 2016-02-09 DIAGNOSIS — R69 Illness, unspecified: Secondary | ICD-10-CM

## 2016-02-09 DIAGNOSIS — J029 Acute pharyngitis, unspecified: Secondary | ICD-10-CM | POA: Insufficient documentation

## 2016-02-09 DIAGNOSIS — Z87891 Personal history of nicotine dependence: Secondary | ICD-10-CM | POA: Diagnosis not present

## 2016-02-09 DIAGNOSIS — I10 Essential (primary) hypertension: Secondary | ICD-10-CM | POA: Diagnosis not present

## 2016-02-09 DIAGNOSIS — J111 Influenza due to unidentified influenza virus with other respiratory manifestations: Secondary | ICD-10-CM

## 2016-02-09 DIAGNOSIS — B349 Viral infection, unspecified: Secondary | ICD-10-CM | POA: Insufficient documentation

## 2016-02-09 DIAGNOSIS — R509 Fever, unspecified: Secondary | ICD-10-CM | POA: Insufficient documentation

## 2016-02-09 DIAGNOSIS — Z9889 Other specified postprocedural states: Secondary | ICD-10-CM | POA: Insufficient documentation

## 2016-02-09 DIAGNOSIS — R5383 Other fatigue: Secondary | ICD-10-CM | POA: Insufficient documentation

## 2016-02-09 LAB — POCT RAPID STREP A: Streptococcus, Group A Screen (Direct): NEGATIVE

## 2016-02-09 MED ORDER — IPRATROPIUM BROMIDE 0.06 % NA SOLN: 2.0000 | Freq: Four times a day (QID) | NASAL | 0 refills | Status: DC

## 2016-02-09 MED ORDER — IBUPROFEN 800 MG PO TABS: 800.0000 mg | ORAL_TABLET | Freq: Three times a day (TID) | ORAL | 0 refills | Status: DC

## 2016-02-09 NOTE — ED Triage Notes (Signed)
Pt  Reports   Symptoms  Of  Body  Aches     Congestion   Chills   sorethroat    With onset  Of  Symptoms  X  2  Days       Pt  Has  Been taking otc meds   With  No releif    Pt  Sitting uipright on the  Exam table  In no  Acute  Distress

## 2016-02-09 NOTE — Discharge Instructions (Signed)
800 mg of ibuprofen with 1 g of Tylenol 3 times a day as needed for body aches, pain and fever. Start some saline nasal irrigation with a neti pot or Neil Med sinus rinse. Start some regular Mucinex. Follow up with primary care physician as needed. Go to the ER for the signs and symptoms we discussed

## 2016-02-09 NOTE — ED Provider Notes (Signed)
HPI  SUBJECTIVE:  Nathan Nelson is a 30 y.o. male who presents with nasal congestion, rhinorrhea, postnasal drip, sore throat and fatigue for the past 2 days. He reports fevers Tmax 101. He reports body aches, headache yesterday. the headache has resolved. He reports occasional cough. He has tried over-the-counter cold cough medicines for people with hypertension. Symptoms are better with sleeping, sore throat is worse at night. No neck stiffness, sinus pain/pressure, ear pain. No photophobia. No wheezing, chest pain, shortness of breath. Reports a raspy voice but no muffled hot potato voice. No drooling, trismus. No abdominal pain. No known sick contacts with strep or flu or upper respiratory infections. No allergy type symptoms. No antibiotics recently. No antipyretic in the past 6-8 hours. Past medical history of hypertension. no history of asthma, emphysema, COPD, diabetes. He is not a smoker. PMD: Dr. Parke Simmers.    Past Medical History:  Diagnosis Date  . Anxiety   . Hypertension     Past Surgical History:  Procedure Laterality Date  . KNEE SURGERY      History reviewed. No pertinent family history.  Social History  Substance Use Topics  . Smoking status: Former Smoker    Quit date: 03/02/2014  . Smokeless tobacco: Never Used  . Alcohol use No    No current facility-administered medications for this encounter.   Current Outpatient Prescriptions:  .  amLODipine (NORVASC) 10 MG tablet, Take 1 tablet (10 mg total) by mouth daily., Disp: 30 tablet, Rfl: 0 .  clonazePAM (KLONOPIN) 0.5 MG tablet, Take 1 tablet (0.5 mg total) by mouth 2 (two) times daily as needed for anxiety., Disp: 20 tablet, Rfl: 0 .  hydrochlorothiazide (HYDRODIURIL) 25 MG tablet, Take 1 tablet (25 mg total) by mouth daily., Disp: 30 tablet, Rfl: 1 .  ibuprofen (ADVIL,MOTRIN) 800 MG tablet, Take 1 tablet (800 mg total) by mouth 3 (three) times daily., Disp: 30 tablet, Rfl: 0 .  ipratropium (ATROVENT) 0.06 % nasal  spray, Place 2 sprays into both nostrils 4 (four) times daily. 3-4 times/ day, Disp: 15 mL, Rfl: 0 .  metoprolol succinate (TOPROL-XL) 25 MG 24 hr tablet, Take 1 tablet (25 mg total) by mouth daily., Disp: 30 tablet, Rfl: 0  Allergies  Allergen Reactions  . Lisinopril Other (See Comments)    Tightness in chest     ROS  As noted in HPI.   Physical Exam  BP 120/70 (BP Location: Right Arm)   Pulse 78   Temp 98.6 F (37 C)   Resp 18   SpO2 99%   Constitutional: Well developed, well nourished, no acute distress Eyes:  EOMI, conjunctiva normal bilaterally HENT: Normocephalic, atraumatic,mucus membranes moist. TMs normal. Positive erythematous turbinates with clear nasal congestion. No sinus tenderness. Positive erythematous tonsils with mild swelling, no exudates, uvula midline. Positive postnasal drip. Neck: No lymphadenopathy. No meningismus. Respiratory: Normal inspiratory effort good air movement, lungs clear bilaterally  Cardiovascular: Normal rate, regular rhythm no murmurs, rubs, gallops. GI: nondistended skin: No rash, skin intact Musculoskeletal: no deformities Neurologic: Alert & oriented x 3, no focal neuro deficits Psychiatric: Speech and behavior appropriate   ED Course   Medications - No data to display  Orders Placed This Encounter  Procedures  . POCT rapid strep A [facility])    Standing Status:   Standing    Number of Occurrences:   1    Results for orders placed or performed during the hospital encounter of 02/09/16 (from the past 24 hour(s))  POCT rapid strep A [facility])     Status: None   Collection Time: 02/09/16  1:23 PM  Result Value Ref Range   Streptococcus, Group A Screen (Direct) NEGATIVE NEGATIVE   No results found.  ED Clinical Impression  Influenza-like illness  ED Assessment/Plan  Will  check rapid strep but anticipate this will be negative. Presentation is suggestive of a viral syndrome, influenza-like illness. No  evidence of deep space throat infection, meningitis, otitis, sinusitis, Pneumonia, intra-abdominal process   Home with Atrovent nasal spray, saline nasal irrigation, ibuprofen 800 mg with 1 g of Tylenol 3 times a day, plain Mucinex and will give work note for 2 days. Follow-up with PMD as needed.  Discussed labs, MDM, plan and followup with patient. Discussed sn/sx that should prompt return to the ED. Patient agrees with plan.   Meds ordered this encounter  Medications  . ibuprofen (ADVIL,MOTRIN) 800 MG tablet    Sig: Take 1 tablet (800 mg total) by mouth 3 (three) times daily.    Dispense:  30 tablet    Refill:  0  . ipratropium (ATROVENT) 0.06 % nasal spray    Sig: Place 2 sprays into both nostrils 4 (four) times daily. 3-4 times/ day    Dispense:  15 mL    Refill:  0    *This clinic note was created using Scientist, clinical (histocompatibility and immunogenetics)Dragon dictation software. Therefore, there may be occasional mistakes despite careful proofreading.  ?   Domenick GongAshley Matalynn Graff, MD 02/09/16 1331

## 2016-02-12 LAB — CULTURE, GROUP A STREP (THRC)

## 2016-03-29 ENCOUNTER — Emergency Department (HOSPITAL_COMMUNITY): Payer: BLUE CROSS/BLUE SHIELD

## 2016-03-29 ENCOUNTER — Emergency Department (HOSPITAL_COMMUNITY)
Admission: EM | Admit: 2016-03-29 | Discharge: 2016-03-29 | Disposition: A | Discharge status: Home / Self Care | Payer: BLUE CROSS/BLUE SHIELD | Attending: Emergency Medicine | Admitting: Emergency Medicine

## 2016-03-29 ENCOUNTER — Encounter (HOSPITAL_COMMUNITY): Payer: Self-pay

## 2016-03-29 DIAGNOSIS — Z87891 Personal history of nicotine dependence: Secondary | ICD-10-CM | POA: Diagnosis not present

## 2016-03-29 DIAGNOSIS — R0789 Other chest pain: Secondary | ICD-10-CM | POA: Insufficient documentation

## 2016-03-29 DIAGNOSIS — I10 Essential (primary) hypertension: Secondary | ICD-10-CM | POA: Insufficient documentation

## 2016-03-29 LAB — BASIC METABOLIC PANEL
ANION GAP: 11 (ref 5–15)
BUN: 16 mg/dL (ref 6–20)
CHLORIDE: 97 mmol/L — AB (ref 101–111)
CO2: 30 mmol/L (ref 22–32)
Calcium: 10.3 mg/dL (ref 8.9–10.3)
Creatinine, Ser: 1.27 mg/dL — ABNORMAL HIGH (ref 0.61–1.24)
GFR calc non Af Amer: >60 mL/min (ref >60)
Glucose, Bld: 94 mg/dL (ref 65–99)
POTASSIUM: 4 mmol/L (ref 3.5–5.1)
SODIUM: 138 mmol/L (ref 135–145)

## 2016-03-29 LAB — CBC
HEMATOCRIT: 47.7 % (ref 39.0–52.0)
HEMOGLOBIN: 16.6 g/dL (ref 13.0–17.0)
MCH: 29.9 pg (ref 26.0–34.0)
MCHC: 34.8 g/dL (ref 30.0–36.0)
MCV: 85.8 fL (ref 78.0–100.0)
PLATELETS: 434 10*3/uL — AB (ref 150–400)
RBC: 5.56 MIL/uL (ref 4.22–5.81)
RDW: 13.4 % (ref 11.5–15.5)
WBC: 12.7 10*3/uL — AB (ref 4.0–10.5)

## 2016-03-29 LAB — I-STAT TROPONIN, ED: Troponin i, poc: 0 ng/mL (ref 0.00–0.08)

## 2016-03-29 MED ORDER — NAPROXEN 500 MG PO TABS: 500.0000 mg | ORAL_TABLET | Freq: Two times a day (BID) | ORAL | 0 refills | Status: AC

## 2016-03-29 NOTE — ED Provider Notes (Signed)
[facility] Note   CSN: [id number] Arrival date & time: 03/29/16  1429     History   Chief Complaint Chief Complaint  Patient presents with  . Chest Pain  . left arm pain    HPI Nathan Nelson is a 30 y.o. male.  The history is provided by the patient.  Chest Pain   This is a new problem. Episode onset: 1 week ago. The problem occurs constantly. The problem has not changed since onset.The pain is associated with movement. The pain is present in the lateral region. The pain is mild. The quality of the pain is described as pressure-like. The pain radiates to the left shoulder. The symptoms are aggravated by certain positions. Pertinent negatives include no cough, no hemoptysis, no shortness of breath and no vomiting. He has tried nothing for the symptoms. There are no known risk factors.    Past Medical History:  Diagnosis Date  . Anxiety   . Hypertension     Patient Active Problem List   Diagnosis Date Noted  . Right shoulder injury 09/28/2015  . Costochondritis 06/11/2014  . URI (upper respiratory infection) 05/30/2012  . Hypertension     Past Surgical History:  Procedure Laterality Date  . KNEE SURGERY         Home Medications    Prior to Admission medications   Medication Sig Start Date End Date Taking? Authorizing Provider  amLODipine (NORVASC) 10 MG tablet Take 1 tablet (10 mg total) by mouth daily. 08/01/13   Wendall StadePeter C Nishan, MD  clonazePAM (KLONOPIN) 0.5 MG tablet Take 1 tablet (0.5 mg total) by mouth 2 (two) times daily as needed for anxiety. 04/22/15   John Molpus, MD  hydrochlorothiazide (HYDRODIURIL) 25 MG tablet Take 1 tablet (25 mg total) by mouth daily. 06/20/13   Wendall StadePeter C Nishan, MD  ibuprofen (ADVIL,MOTRIN) 800 MG tablet Take 1 tablet (800 mg total) by mouth 3 (three) times daily. 02/09/16   Domenick GongAshley Mortenson, MD  ipratropium (ATROVENT) 0.06 % nasal spray Place 2 sprays into both nostrils 4 (four) times daily. 3-4 times/ day 02/09/16    Domenick GongAshley Mortenson, MD  metoprolol succinate (TOPROL-XL) 25 MG 24 hr tablet Take 1 tablet (25 mg total) by mouth daily. 12/06/15   Rolland PorterMark James, MD    Family History No family history on file.  Social History Social History  Substance Use Topics  . Smoking status: Former Smoker    Quit date: 03/02/2014  . Smokeless tobacco: Never Used  . Alcohol use No     Allergies   Lisinopril   Review of Systems Review of Systems  Respiratory: Negative for cough, hemoptysis and shortness of breath.   Cardiovascular: Positive for chest pain.  Gastrointestinal: Negative for vomiting.  All other systems reviewed and are negative.    Physical Exam Updated Vital Signs BP 147/98   Pulse 67   Temp 98.3 F (36.8 C) (Oral)   Resp 18   Ht 5\' 6"  (1.676 m)   Wt 200 lb (90.7 kg)   SpO2 100%   BMI 32.28 kg/m   Physical Exam  Constitutional: He is oriented to person, place, and time. He appears well-developed and well-nourished. No distress.  HENT:  Head: Normocephalic and atraumatic.  Nose: Nose normal.  Eyes: Conjunctivae are normal.  Neck: Neck supple. No tracheal deviation present.  Cardiovascular: Normal rate, regular rhythm and normal heart sounds.   Pulmonary/Chest: Effort normal and breath sounds normal. No respiratory distress. He has no wheezes. He  exhibits no tenderness.  Abdominal: Soft. He exhibits no distension.  Neurological: He is alert and oriented to person, place, and time.  Skin: Skin is warm and dry.  Psychiatric: He has a normal mood and affect.  Vitals reviewed.    ED Treatments / Results  Labs (all labs ordered are listed, but only abnormal results are displayed) Labs Reviewed  BASIC METABOLIC PANEL - Abnormal; Notable for the following:       Result Value   Chloride 97 (*)    Creatinine, Ser 1.27 (*)    All other components within normal limits  CBC - Abnormal; Notable for the following:    WBC 12.7 (*)    Platelets 434 (*)    All other components within  normal limits  I-STAT TROPOININ, ED    EKG  EKG Interpretation  Date/Time:  Wednesday March 29 2016 14:30:56 EST Ventricular Rate:  52 PR Interval:  142 QRS Duration: 86 QT Interval:  388 QTC Calculation: 360 R Axis:   94 Text Interpretation:  Sinus bradycardia Rightward axis Borderline ECG When compared to prior, similar appearance.  T wave flattened on AVL from prior No STEMI Confirmed by [facility] CenterEGELER MD, CHRISTOPHER [phone] on 03/29/2016 2:40:50 PM Also confirmed by Rush LandmarkEGELER MD, CHRISTOPHER [phone]), editor [address], Jola BabinskiMarilyn [phone])  on 03/29/2016 2:46:24 PM       Radiology Dg Chest 2 View  Result Date: 03/29/2016 CLINICAL DATA:  Chest and left arm pain for 2 days.  Palpitations. EXAM: CHEST  2 VIEW COMPARISON:  01/31/2015 FINDINGS: The heart size and mediastinal contours are within normal limits. Both lungs are clear. No evidence of pleural effusion or pneumothorax. The visualized skeletal structures are unremarkable. IMPRESSION: Negative.  No active cardiopulmonary disease. Electronically Signed   By: Myles RosenthalJohn  Stahl M.D.   On: 03/29/2016 15:22    Procedures Procedures (including critical care time)  Medications Ordered in ED Medications - No data to display   Initial Impression / Assessment and Plan / ED Course  I have reviewed the triage vital signs and the nursing notes.  Pertinent labs & imaging results that were available during my care of the patient were reviewed by me and considered in my medical decision making (see chart for details).  Clinical Course     30 y.o. male presents with atypical chest pain over the left side. Low risk for ACS, EKG without signs of ischemia and troponin negative despite ongoing pain. Suspect a combination of MSK etiology and stopping klonopin which produced rebound anxiety symptoms. Patient was recommended to take short course of scheduled NSAIDs and engage in early mobility as definitive treatment. Plan to follow up with PCP as needed and  return precautions discussed for worsening or new concerning symptoms.   Final Clinical Impressions(s) / ED Diagnoses   Final diagnoses:  Chest wall pain    New Prescriptions Discharge Medication List as of 03/29/2016  4:31 PM    START taking these medications   Details  naproxen (NAPROSYN) 500 MG tablet Take 1 tablet (500 mg total) by mouth 2 (two) times daily with a meal., Starting Wed 03/29/2016, Until Mon 04/03/2016, Print         Lyndal Pulleyaniel Magic Mohler, MD 03/29/16 2212

## 2016-03-29 NOTE — ED Triage Notes (Signed)
Patient complains of increased anxiety the past week due to being out of his klonopin. Developed intermittent left arm and chest tightness the pasg few days. Requesting to be checked to make sure its just stress

## 2016-04-09 ENCOUNTER — Ambulatory Visit (HOSPITAL_COMMUNITY)
Admission: EM | Admit: 2016-04-09 | Discharge: 2016-04-09 | Disposition: A | Discharge status: Home / Self Care | Payer: BLUE CROSS/BLUE SHIELD | Attending: Internal Medicine | Admitting: Internal Medicine

## 2016-04-09 ENCOUNTER — Encounter (HOSPITAL_COMMUNITY): Payer: Self-pay | Admitting: Emergency Medicine

## 2016-04-09 DIAGNOSIS — F419 Anxiety disorder, unspecified: Secondary | ICD-10-CM

## 2016-04-09 DIAGNOSIS — I10 Essential (primary) hypertension: Secondary | ICD-10-CM

## 2016-04-09 MED ORDER — CLONAZEPAM 0.5 MG PO TABS: 0.5000 mg | ORAL_TABLET | Freq: Two times a day (BID) | ORAL | 0 refills | Status: DC | PRN

## 2016-04-09 MED ORDER — PROPRANOLOL HCL 20 MG PO TABS: 20.0000 mg | ORAL_TABLET | Freq: Three times a day (TID) | ORAL | 0 refills | Status: DC | PRN

## 2016-04-09 NOTE — Discharge Instructions (Addendum)
Prescription for propranolol (inderal, a beta blocker) to take as needed for anxiety [facility] [address].  Prescription for a small number of clonazepam was printed.  [facility], while between insurance coverage; phone numbers are in this printout.

## 2016-04-09 NOTE — ED Triage Notes (Signed)
PT has been on clonazepam 0.25mg  for 1 year. PT ran out 2 weeks ago. PT's PCP moved practices. PT reports anxiety has increased since he ran out and he cannot relax.

## 2016-04-16 NOTE — ED Provider Notes (Signed)
[facility]    CSN: [id number] Arrival date & time: 04/09/16  1723     History   Chief Complaint Chief Complaint  Patient presents with  . Anxiety    HPI Nathan Nelson is a 30 y.o. male.  Presents today with increased anxiety symptoms after running out of clonazepam 2 wks ago.  Pt's PCP no longer available.  No suicidal/homicidal ideation.  HPI  Past Medical History:  Diagnosis Date  . Anxiety   . Hypertension     Patient Active Problem List   Diagnosis Date Noted  . Right shoulder injury 09/28/2015  . Costochondritis 06/11/2014  . URI (upper respiratory infection) 05/30/2012  . Hypertension     Past Surgical History:  Procedure Laterality Date  . KNEE SURGERY         Home Medications    Prior to Admission medications   Medication Sig Start Date End Date Taking? Authorizing Provider  amLODipine (NORVASC) 10 MG tablet Take 1 tablet (10 mg total) by mouth daily. 08/01/13  Yes Wendall StadePeter C Nishan, MD  clonazePAM (KLONOPIN) 0.5 MG tablet Take 1 tablet (0.5 mg total) by mouth 2 (two) times daily as needed for anxiety. 04/22/15  Yes John Molpus, MD  hydrochlorothiazide (HYDRODIURIL) 25 MG tablet Take 1 tablet (25 mg total) by mouth daily. 06/20/13  Yes Wendall StadePeter C Nishan, MD  metoprolol succinate (TOPROL-XL) 25 MG 24 hr tablet Take 1 tablet (25 mg total) by mouth daily. 12/06/15  Yes Rolland PorterMark James, MD  clonazePAM (KLONOPIN) 0.5 MG tablet Take 1 tablet (0.5 mg total) by mouth 2 (two) times daily as needed for anxiety. 04/09/16   Eustace MooreLaura W Latravious Levitt, MD  ibuprofen (ADVIL,MOTRIN) 800 MG tablet Take 1 tablet (800 mg total) by mouth 3 (three) times daily. 02/09/16   Domenick GongAshley Mortenson, MD  ipratropium (ATROVENT) 0.06 % nasal spray Place 2 sprays into both nostrils 4 (four) times daily. 3-4 times/ day 02/09/16   Domenick GongAshley Mortenson, MD  propranolol (INDERAL) 20 MG tablet Take 1 tablet (20 mg total) by mouth 3 (three) times daily as needed. 04/09/16 05/09/16  Eustace MooreLaura W Shalom Mcguiness, MD    Family  History No family history on file.  Social History Social History  Substance Use Topics  . Smoking status: Former Smoker    Quit date: 03/02/2014  . Smokeless tobacco: Never Used  . Alcohol use 0.0 oz/week     Comment: rarely     Allergies   Lisinopril   Review of Systems Review of Systems  All other systems reviewed and are negative.    Physical Exam Triage Vital Signs ED Triage Vitals  Enc Vitals Group     BP 04/09/16 1804 142/100     Pulse Rate 04/09/16 1804 98     Resp 04/09/16 1804 16     Temp 04/09/16 1804 98.9 F (37.2 C)     Temp Source 04/09/16 1804 Oral     SpO2 04/09/16 1804 100 %     Weight 04/09/16 1804 200 lb (90.7 kg)     Height 04/09/16 1804 5\' 5"  (1.651 m)     Pain Score 04/09/16 1806 0   Updated Vital Signs BP 139/91   Pulse 100   Temp 98.9 F (37.2 C) (Oral)   Resp 16   Ht 5\' 5"  (1.651 m)   Wt 200 lb (90.7 kg)   SpO2 100%   BMI 33.28 kg/m  Physical Exam  Constitutional: He is oriented to person, place, and time. No distress.  Alert, nicely groomed  HENT:  Head: Atraumatic.  Eyes:  Conjugate gaze, no eye redness/drainage  Neck: Neck supple.  Cardiovascular: Normal rate.   Pulmonary/Chest: No respiratory distress.  Abdominal: He exhibits no distension.  Musculoskeletal: Normal range of motion.  Neurological: He is alert and oriented to person, place, and time.  Skin: Skin is warm and dry.  No cyanosis  Nursing note and vitals reviewed.    UC Treatments / Results   Procedures Procedures (including critical care time) None today  Final Clinical Impressions(s) / UC Diagnoses   Final diagnoses:  Anxiety  Hypertension, unspecified type   Prescription for propranolol (inderal, a beta blocker) to take as needed for anxiety [facility] [address].  Prescription for a small number of clonazepam was printed.  [facility], while between insurance  coverage; phone numbers are in this printout.    New Prescriptions Discharge Medication List as of 04/09/2016  7:44 PM    START taking these medications   Details  !! clonazePAM (KLONOPIN) 0.5 MG tablet Take 1 tablet (0.5 mg total) by mouth 2 (two) times daily as needed for anxiety., Starting Sun 04/09/2016, Print    propranolol (INDERAL) 20 MG tablet Take 1 tablet (20 mg total) by mouth 3 (three) times daily as needed., Starting Sun 04/09/2016, Until Tue 05/09/2016, Normal     !! - Potential duplicate medications found. Please discuss with provider.       Eustace MooreLaura W Lezette Kitts, MD 05/06/16 [phone]

## 2017-04-02 ENCOUNTER — Emergency Department (HOSPITAL_BASED_OUTPATIENT_CLINIC_OR_DEPARTMENT_OTHER): Payer: Managed Care, Other (non HMO)

## 2017-04-02 ENCOUNTER — Encounter (HOSPITAL_BASED_OUTPATIENT_CLINIC_OR_DEPARTMENT_OTHER): Payer: Self-pay | Admitting: *Deleted

## 2017-04-02 ENCOUNTER — Emergency Department (HOSPITAL_BASED_OUTPATIENT_CLINIC_OR_DEPARTMENT_OTHER)
Admission: EM | Admit: 2017-04-02 | Discharge: 2017-04-02 | Disposition: A | Discharge status: Home / Self Care | Payer: Managed Care, Other (non HMO) | Attending: Emergency Medicine | Admitting: Emergency Medicine

## 2017-04-02 ENCOUNTER — Other Ambulatory Visit: Payer: Self-pay

## 2017-04-02 DIAGNOSIS — Z87891 Personal history of nicotine dependence: Secondary | ICD-10-CM | POA: Insufficient documentation

## 2017-04-02 DIAGNOSIS — R0789 Other chest pain: Secondary | ICD-10-CM | POA: Diagnosis present

## 2017-04-02 DIAGNOSIS — R42 Dizziness and giddiness: Secondary | ICD-10-CM | POA: Diagnosis not present

## 2017-04-02 DIAGNOSIS — I10 Essential (primary) hypertension: Secondary | ICD-10-CM

## 2017-04-02 DIAGNOSIS — Z79899 Other long term (current) drug therapy: Secondary | ICD-10-CM | POA: Insufficient documentation

## 2017-04-02 LAB — BASIC METABOLIC PANEL
Anion gap: 6 (ref 5–15)
BUN: 14 mg/dL (ref 6–20)
CHLORIDE: 100 mmol/L — AB (ref 101–111)
CO2: 30 mmol/L (ref 22–32)
CREATININE: 0.95 mg/dL (ref 0.61–1.24)
Calcium: 9.6 mg/dL (ref 8.9–10.3)
GFR calc non Af Amer: >60 mL/min (ref >60)
Glucose, Bld: 101 mg/dL — ABNORMAL HIGH (ref 65–99)
Potassium: 3.3 mmol/L — ABNORMAL LOW (ref 3.5–5.1)
SODIUM: 136 mmol/L (ref 135–145)

## 2017-04-02 LAB — TROPONIN I

## 2017-04-02 MED ORDER — NAPROXEN 500 MG PO TABS: 500.0000 mg | ORAL_TABLET | Freq: Two times a day (BID) | ORAL | 0 refills | Status: DC

## 2017-04-02 NOTE — ED Triage Notes (Addendum)
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  Alert, NAD, calm, interactive, resps e/u, speaking in clear complete sentences, no dyspnea noted, skin W&D, VSS, (denies: fever, NVD, HA, CP, or dizziness).  Last klonopin 4-5d ago

## 2017-04-02 NOTE — ED Notes (Signed)
EDP into room 

## 2017-04-02 NOTE — ED Provider Notes (Signed)
[facility] EMERGENCY DEPARTMENT Provider Note   CSN: [id number] Arrival date & time: 04/02/17  0325     History   Chief Complaint Chief Complaint  Patient presents with  . Hypertension    HPI Nathan Nelson is a 31 y.o. male.  HPI  This is a 31 year old male with history of hypertension who presents with a 2-day history of generalized lightheadedness and left sided chest wall pain.  Patient reports specifically in the morning he feels lightheaded.  He describes it as feeling off balance.  Denies any room spinning dizziness.  Reports good p.o. intake.  No recent fevers or infectious symptoms.  He states that he has noted some left-sided chest pain.  It is worse when he is pushing things at work.  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  He has not taken anything for his symptoms.  Current pain is 4 out of 10.  He denies any nausea, vomiting, diarrhea.  Denies any headaches.  He is currently on blood pressure medication and reports compliance.  He did run out of his Klonopin.  Past Medical History:  Diagnosis Date  . Anxiety   . Hypertension     Patient Active Problem List   Diagnosis Date Noted  . Right shoulder injury 09/28/2015  . Costochondritis 06/11/2014  . URI (upper respiratory infection) 05/30/2012  . Hypertension     Past Surgical History:  Procedure Laterality Date  . KNEE SURGERY         Home Medications    Prior to Admission medications   Medication Sig Start Date End Date Taking? Authorizing Provider  amLODipine (NORVASC) 10 MG tablet Take 1 tablet (10 mg total) by mouth daily. 08/01/13   Wendall StadeNishan, Peter C, MD  clonazePAM (KLONOPIN) 0.5 MG tablet Take 1 tablet (0.5 mg total) by mouth 2 (two) times daily as needed for anxiety. 04/22/15   Molpus, Jonny RuizJohn, MD  clonazePAM (KLONOPIN) 0.5 MG tablet Take 1 tablet (0.5 mg total) by mouth 2 (two) times daily as needed for anxiety. 04/09/16   Eustace MooreMurray, Laura W, MD  hydrochlorothiazide (HYDRODIURIL) 25 MG  tablet Take 1 tablet (25 mg total) by mouth daily. 06/20/13   Wendall StadeNishan, Peter C, MD  ibuprofen (ADVIL,MOTRIN) 800 MG tablet Take 1 tablet (800 mg total) by mouth 3 (three) times daily. 02/09/16   Domenick GongMortenson, Ashley, MD  ipratropium (ATROVENT) 0.06 % nasal spray Place 2 sprays into both nostrils 4 (four) times daily. 3-4 times/ day 02/09/16   Domenick GongMortenson, Ashley, MD  metoprolol succinate (TOPROL-XL) 25 MG 24 hr tablet Take 1 tablet (25 mg total) by mouth daily. 12/06/15   Rolland PorterJames, Mark, MD  naproxen (NAPROSYN) 500 MG tablet Take 1 tablet (500 mg total) 2 (two) times daily by mouth. 04/02/17   Horton, Mayer Maskerourtney F, MD  propranolol (INDERAL) 20 MG tablet Take 1 tablet (20 mg total) by mouth 3 (three) times daily as needed. 04/09/16 05/09/16  Eustace MooreMurray, Laura W, MD    Family History History reviewed. No pertinent family history.  Social History Social History   Tobacco Use  . Smoking status: Former Smoker    Last attempt to quit: 03/02/2014    Years since quitting: 3.0  . Smokeless tobacco: Never Used  Substance Use Topics  . Alcohol use: Yes    Alcohol/week: 0.0 oz    Comment: rarely  . Drug use: No     Allergies   Lisinopril   Review of Systems Review of Systems  Constitutional: Negative for chills  and fever.  Respiratory: Negative for shortness of breath.   Cardiovascular: Positive for chest pain.  Gastrointestinal: Negative for abdominal pain, nausea and vomiting.  Musculoskeletal: Negative for back pain.  Neurological: Positive for dizziness. Negative for syncope.  All other systems reviewed and are negative.    Physical Exam Updated Vital Signs BP (!) 155/104   Pulse 64   Temp 98.1 F (36.7 C) (Oral)   Resp 18   Ht 5\' 5"  (1.651 m)   Wt 81.6 kg (180 lb)   SpO2 96%   BMI 29.95 kg/m   Physical Exam  Constitutional: He is oriented to person, place, and time. He appears well-developed and well-nourished.  HENT:  Head: Normocephalic and atraumatic.  Cardiovascular: Normal rate,  regular rhythm and normal heart sounds.  No murmur heard. Pulmonary/Chest: Effort normal and breath sounds normal. No respiratory distress. He has no wheezes. He exhibits tenderness.  Anterior chest wall tenderness to palpation as well as left chest wall tenderness to palpation, no crepitus  Abdominal: Soft. Bowel sounds are normal. There is no tenderness. There is no rebound.  Musculoskeletal: He exhibits no edema.  Neurological: He is alert and oriented to person, place, and time.  Cranial nerves II through XII intact, 5 out of 5 strength in all 4 extremities, no dysmetria to finger-nose-finger  Skin: Skin is warm and dry.  Psychiatric: He has a normal mood and affect.  Nursing note and vitals reviewed.    ED Treatments / Results  Labs (all labs ordered are listed, but only abnormal results are displayed) Labs Reviewed  BASIC METABOLIC PANEL - Abnormal; Notable for the following components:      Result Value   Potassium 3.3 (*)    Chloride 100 (*)    Glucose, Bld 101 (*)    All other components within normal limits  TROPONIN I    EKG  EKG Interpretation  Date/Time:  Monday April 02 2017 04:10:05 EST Ventricular Rate:  69 PR Interval:    QRS Duration: 92 QT Interval:  383 QTC Calculation: 411 R Axis:   100 Text Interpretation:  Sinus arrhythmia Right axis deviation ST elev, probable normal early repol pattern Baseline wander in lead(s) V2 No significant change since last tracing Confirmed by Ross Marcus ([id number]) on 04/02/2017 4:19:06 AM       Radiology Dg Chest 2 View  Result Date: 04/02/2017 CLINICAL DATA:  Left-sided chest tightness. High blood pressure. Blurry vision. EXAM: CHEST  2 VIEW COMPARISON:  05/18/2016 FINDINGS: The heart size and mediastinal contours are within normal limits. Both lungs are clear. The visualized skeletal structures are unremarkable. IMPRESSION: No active cardiopulmonary disease. Electronically Signed   By: Burman Nieves M.D.   On:  04/02/2017 04:44    Procedures Procedures (including critical care time)  Medications Ordered in ED Medications - No data to display   Initial Impression / Assessment and Plan / ED Course  I have reviewed the triage vital signs and the nursing notes.  Pertinent labs & imaging results that were available during my care of the patient were reviewed by me and considered in my medical decision making (see chart for details).    Presents with several complaints including chest wall pain and lightheadedness.  He is nontoxic on exam.  Blood pressure 155/104.  Treated hypertension.  Reports compliance with medications.  He has reproducible chest wall tenderness without crepitus.  Reports worsening with certain movements and pushing carts at work.  Suspect musculoskeletal etiology.  EKG is unchanged.  Chest x-ray is reassuring.  Troponin is negative.  BMP was obtained.  No significant metabolite abnormalities.  Mildly hypokalemic.  Patient was reassured.  Recommend naproxen for chest wall pain.  With PCP for blood pressure recheck and medication adjustment.  After history, exam, and medical workup I feel the patient has been appropriately medically screened and is safe for discharge home. Pertinent diagnoses were discussed with the patient. Patient was given return precautions.   Final Clinical Impressions(s) / ED Diagnoses   Final diagnoses:  Chest wall pain  Essential hypertension    ED Discharge Orders        Ordered    naproxen (NAPROSYN) 500 MG tablet  2 times daily     04/02/17 0531       Horton, Mayer Maskerourtney F, MD 04/02/17 [phone]

## 2017-04-02 NOTE — ED Notes (Signed)
Pt lying flat for orthostatic vital signs.

## 2017-04-02 NOTE — ED Notes (Signed)
By stretcher to xray, alert, NAD, calm, no changes.

## 2017-04-02 NOTE — ED Notes (Signed)
No changes. Back from xray.

## 2017-07-13 ENCOUNTER — Other Ambulatory Visit: Payer: Self-pay

## 2017-07-13 ENCOUNTER — Encounter (HOSPITAL_BASED_OUTPATIENT_CLINIC_OR_DEPARTMENT_OTHER): Payer: Self-pay | Admitting: Emergency Medicine

## 2017-07-13 DIAGNOSIS — Z79899 Other long term (current) drug therapy: Secondary | ICD-10-CM | POA: Insufficient documentation

## 2017-07-13 DIAGNOSIS — R109 Unspecified abdominal pain: Secondary | ICD-10-CM | POA: Diagnosis not present

## 2017-07-13 DIAGNOSIS — I10 Essential (primary) hypertension: Secondary | ICD-10-CM | POA: Diagnosis not present

## 2017-07-13 DIAGNOSIS — L0201 Cutaneous abscess of face: Secondary | ICD-10-CM | POA: Diagnosis not present

## 2017-07-13 DIAGNOSIS — R197 Diarrhea, unspecified: Secondary | ICD-10-CM | POA: Diagnosis not present

## 2017-07-13 DIAGNOSIS — R112 Nausea with vomiting, unspecified: Secondary | ICD-10-CM | POA: Diagnosis not present

## 2017-07-13 DIAGNOSIS — Z87891 Personal history of nicotine dependence: Secondary | ICD-10-CM | POA: Insufficient documentation

## 2017-07-13 NOTE — ED Triage Notes (Signed)
Pt presents with c/o n/v/d since 1 pm today. Pt reports vomiting X 3 and diarrhea multiple times.

## 2017-07-14 ENCOUNTER — Emergency Department (HOSPITAL_BASED_OUTPATIENT_CLINIC_OR_DEPARTMENT_OTHER)
Admission: EM | Admit: 2017-07-14 | Discharge: 2017-07-14 | Disposition: A | Discharge status: Home / Self Care | Payer: Managed Care, Other (non HMO) | Attending: Emergency Medicine | Admitting: Emergency Medicine

## 2017-07-14 DIAGNOSIS — L0291 Cutaneous abscess, unspecified: Secondary | ICD-10-CM

## 2017-07-14 DIAGNOSIS — R197 Diarrhea, unspecified: Secondary | ICD-10-CM

## 2017-07-14 DIAGNOSIS — R112 Nausea with vomiting, unspecified: Secondary | ICD-10-CM

## 2017-07-14 MED ORDER — DICYCLOMINE HCL 10 MG PO CAPS
ORAL_CAPSULE | ORAL | Status: AC
  Filled 2017-07-14: qty 2

## 2017-07-14 MED ORDER — SULFAMETHOXAZOLE-TRIMETHOPRIM 800-160 MG PO TABS: 1.0000 | ORAL_TABLET | Freq: Two times a day (BID) | ORAL | 0 refills | Status: AC

## 2017-07-14 MED ORDER — ONDANSETRON 4 MG PO TBDP: 4.0000 mg | ORAL_TABLET | Freq: Four times a day (QID) | ORAL | 0 refills | Status: DC | PRN

## 2017-07-14 MED ORDER — DICYCLOMINE HCL 10 MG PO CAPS
20.0000 mg | ORAL_CAPSULE | Freq: Once | ORAL | Status: AC
  Administered 2017-07-14: 20 mg via ORAL
  Filled 2017-07-14: qty 2

## 2017-07-14 MED ORDER — ONDANSETRON 4 MG PO TBDP
4.0000 mg | ORAL_TABLET | Freq: Once | ORAL | Status: AC
  Administered 2017-07-14: 4 mg via ORAL
  Filled 2017-07-14: qty 1

## 2017-07-14 MED ORDER — SULFAMETHOXAZOLE-TRIMETHOPRIM 800-160 MG PO TABS
1.0000 | ORAL_TABLET | Freq: Once | ORAL | Status: AC
  Administered 2017-07-14: 1 via ORAL
  Filled 2017-07-14: qty 1

## 2017-07-14 MED ORDER — LOPERAMIDE HCL 2 MG PO CAPS
ORAL_CAPSULE | ORAL | Status: AC
  Filled 2017-07-14: qty 1

## 2017-07-14 MED ORDER — DICYCLOMINE HCL 20 MG PO TABS: 20.0000 mg | ORAL_TABLET | Freq: Three times a day (TID) | ORAL | 0 refills | Status: DC | PRN

## 2017-07-14 MED ORDER — LOPERAMIDE HCL 2 MG PO CAPS
2.0000 mg | ORAL_CAPSULE | Freq: Once | ORAL | Status: AC
  Administered 2017-07-14: 2 mg via ORAL
  Filled 2017-07-14: qty 1

## 2017-07-14 MED ORDER — LOPERAMIDE HCL 2 MG PO CAPS: 2.0000 mg | ORAL_CAPSULE | Freq: Four times a day (QID) | ORAL | 0 refills | Status: DC | PRN

## 2017-07-14 NOTE — ED Provider Notes (Signed)
TIME SEEN: 12:33 AM  CHIEF COMPLAINT: Nausea, vomiting, diarrhea  HPI: Patient is a 32 year old male with history of hypertension, anxiety who presents to the emergency department nausea, vomiting or diarrhea.  States symptoms started at noon today after eating I hop.  No fever.  Is having abdominal cramping.  No previous abdominal surgery.  No sick contact or recent travel.  No dysuria or hematuria.  Also reports several weeks of an area of swelling and tenderness along the left jaw.  Was seen by another physician who thought this could be related to his teeth.  Was started on amoxicillin without any relief.  States he did see a dentist who did not feel this was related.  States now the area has become more swollen and then it began to drain purulent drainage yesterday.  ROS: See HPI Constitutional: no fever  Eyes: no drainage  ENT: no runny nose   Cardiovascular:  no chest pain  Resp: no SOB  GI: Diarrhea and vomiting GU: no dysuria Integumentary: no rash  Allergy: no hives  Musculoskeletal: no leg swelling  Neurological: no slurred speech ROS otherwise negative  PAST MEDICAL HISTORY/PAST SURGICAL HISTORY:  Past Medical History:  Diagnosis Date  . Anxiety   . Hypertension     MEDICATIONS:  Prior to Admission medications   Medication Sig Start Date End Date Taking? Authorizing Provider  amLODipine (NORVASC) 10 MG tablet Take 1 tablet (10 mg total) by mouth daily. 08/01/13   Wendall Stade, MD  clonazePAM (KLONOPIN) 0.5 MG tablet Take 1 tablet (0.5 mg total) by mouth 2 (two) times daily as needed for anxiety. 04/22/15   Molpus, Jonny Ruiz, MD  clonazePAM (KLONOPIN) 0.5 MG tablet Take 1 tablet (0.5 mg total) by mouth 2 (two) times daily as needed for anxiety. 04/09/16   Isa Rankin, MD  hydrochlorothiazide (HYDRODIURIL) 25 MG tablet Take 1 tablet (25 mg total) by mouth daily. 06/20/13   Wendall Stade, MD  ibuprofen (ADVIL,MOTRIN) 800 MG tablet Take 1 tablet (800 mg total) by  mouth 3 (three) times daily. 02/09/16   Domenick Gong, MD  ipratropium (ATROVENT) 0.06 % nasal spray Place 2 sprays into both nostrils 4 (four) times daily. 3-4 times/ day 02/09/16   Domenick Gong, MD  metoprolol succinate (TOPROL-XL) 25 MG 24 hr tablet Take 1 tablet (25 mg total) by mouth daily. 12/06/15   Rolland Porter, MD  naproxen (NAPROSYN) 500 MG tablet Take 1 tablet (500 mg total) 2 (two) times daily by mouth. 04/02/17   Horton, Mayer Masker, MD  propranolol (INDERAL) 20 MG tablet Take 1 tablet (20 mg total) by mouth 3 (three) times daily as needed. 04/09/16 05/09/16  Isa Rankin, MD    ALLERGIES:  Allergies  Allergen Reactions  . Lisinopril Other (See Comments)    Tightness in chest    SOCIAL HISTORY:  Social History   Tobacco Use  . Smoking status: Former Smoker    Last attempt to quit: 03/02/2014    Years since quitting: 3.3  . Smokeless tobacco: Never Used  Substance Use Topics  . Alcohol use: Yes    Alcohol/week: 0.0 oz    Comment: rarely    FAMILY HISTORY: No family history on file.  EXAM: BP (!) 141/95 (BP Location: Left Arm)   Pulse 82   Temp 98.4 F (36.9 C) (Oral)   Resp 18   Ht 5\' 5"  (1.651 m)   Wt 89.4 kg (197 lb 1.5 oz)   SpO2 99%  BMI 32.80 kg/m  CONSTITUTIONAL: Alert and oriented and responds appropriately to questions. Well-appearing; well-nourished HEAD: Normocephalic EYES: Conjunctivae clear, pupils appear equal, EOMI ENT: normal nose; moist mucous membranes; No pharyngeal erythema or petechiae, no tonsillar hypertrophy or exudate, no uvular deviation, no unilateral swelling, no trismus or drooling, no muffled voice, normal phonation, no stridor, no dental caries present, no drainable dental abscess noted, no Ludwig's angina, tongue sits flat in the bottom of the mouth, no angioedema, no facial erythema or warmth, no facial swelling; no pain with movement of the neck.  At the angle of the left mandible there is a 1 x 2 cm indurated area  that has dried appearing crusted purulent drainage noted.  There is no active drainage or fluctuance.  Slightly erythematous and warm. NECK: Supple, no meningismus, no nuchal rigidity, anterior reactive cervical lymphadenopathy noted to the left neck CARD: RRR; S1 and S2 appreciated; no murmurs, no clicks, no rubs, no gallops RESP: Normal chest excursion without splinting or tachypnea; breath sounds clear and equal bilaterally; no wheezes, no rhonchi, no rales, no hypoxia or respiratory distress, speaking full sentences ABD/GI: Normal bowel sounds; non-distended; soft, non-tender, no rebound, no guarding, no peritoneal signs, no hepatosplenomegaly BACK:  The back appears normal and is non-tender to palpation, there is no CVA tenderness EXT: Normal ROM in all joints; non-tender to palpation; no edema; normal capillary refill; no cyanosis, no calf tenderness or swelling    SKIN: Normal color for age and race; warm; no rash NEURO: Moves all extremities equally PSYCH: The patient's mood and manner are appropriate. Grooming and personal hygiene are appropriate.  MEDICAL DECISION MAKING: Patient here with nausea vomiting and diarrhea.  Suspect viral gastroenteritis.  Abdominal exam benign.  Doubt appendicitis, colitis, bowel obstruction, diverticulitis, cholecystitis, pancreatitis.  We will treat symptomatically with Zofran, Imodium, Bentyl.  Will fluid challenge.  Patient also has a small abscess with minimal amount of cellulitis on exam to the left angle of the mandible.  It has already drained significantly.  Reports it is much smaller.  No fluctuance currently and I do not feel it needs further incision and drainage.  Will start him on Bactrim.  ED PROGRESS: Patient reports feeling better.  Able to tolerate oral fluids without difficulty.  Will discharge home with Zofran, Imodium, Bentyl and Bactrim for his abscess and small amount of facial cellulitis.  Discussed return precautions.  Will provide with  work note.   At this time, I do not feel there is any life-threatening condition present. I have reviewed and discussed all results (EKG, imaging, lab, urine as appropriate) and exam findings with patient/family. I have reviewed nursing notes and appropriate previous records.  I feel the patient is safe to be discharged home without further emergent workup and can continue workup as an outpatient as needed. Discussed usual and customary return precautions. Patient/family verbalize understanding and are comfortable with this plan.  Outpatient follow-up has been provided if needed. All questions have been answered.      Ward, Layla MawKristen N, DO 07/14/17 0140

## 2017-07-14 NOTE — ED Notes (Signed)
Given gingerale for fluid challenge  

## 2017-07-14 NOTE — ED Notes (Signed)
ED Provider at bedside. 

## 2017-10-11 ENCOUNTER — Other Ambulatory Visit: Payer: Self-pay

## 2017-10-11 ENCOUNTER — Encounter: Payer: Self-pay | Admitting: Emergency Medicine

## 2017-10-11 ENCOUNTER — Emergency Department
Admission: EM | Admit: 2017-10-11 | Discharge: 2017-10-11 | Disposition: A | Discharge status: Home / Self Care | Payer: Managed Care, Other (non HMO) | Attending: Emergency Medicine | Admitting: Emergency Medicine

## 2017-10-11 DIAGNOSIS — Z79899 Other long term (current) drug therapy: Secondary | ICD-10-CM | POA: Insufficient documentation

## 2017-10-11 DIAGNOSIS — I10 Essential (primary) hypertension: Secondary | ICD-10-CM | POA: Insufficient documentation

## 2017-10-11 DIAGNOSIS — Z87891 Personal history of nicotine dependence: Secondary | ICD-10-CM | POA: Insufficient documentation

## 2017-10-11 DIAGNOSIS — M62838 Other muscle spasm: Secondary | ICD-10-CM | POA: Insufficient documentation

## 2017-10-11 DIAGNOSIS — F419 Anxiety disorder, unspecified: Secondary | ICD-10-CM | POA: Insufficient documentation

## 2017-10-11 DIAGNOSIS — M940 Chondrocostal junction syndrome [Tietze]: Secondary | ICD-10-CM | POA: Insufficient documentation

## 2017-10-11 MED ORDER — INDOMETHACIN 50 MG PO CAPS: 50.0000 mg | ORAL_CAPSULE | Freq: Two times a day (BID) | ORAL | 0 refills | Status: AC

## 2017-10-11 MED ORDER — METHOCARBAMOL 500 MG PO TABS: 500.0000 mg | ORAL_TABLET | Freq: Three times a day (TID) | ORAL | 0 refills | Status: AC | PRN

## 2017-10-11 NOTE — ED Provider Notes (Signed)
[facility] Emergency Department Provider Note  ____________________________________________  Time seen: Approximately 10:43 PM  I have reviewed the triage vital signs and the nursing notes.   HISTORY  Chief Complaint Neck Pain and Back Pain    HPI Nathan Nelson is a 32 y.o. male presents to the emergency department with tenderness reproduced with palpation over costochondral joints.  Patient reports that his pain is worsened with a deep breath.  He denies recent illness.  He denies nonproductive cough, rhinorrhea, congestion or low-grade fever.  No alleviating measures have been attempted.   Past Medical History:  Diagnosis Date  . Anxiety   . Hypertension     Patient Active Problem List   Diagnosis Date Noted  . Right shoulder injury 09/28/2015  . Costochondritis 06/11/2014  . URI (upper respiratory infection) 05/30/2012  . Hypertension     Past Surgical History:  Procedure Laterality Date  . KNEE SURGERY      Prior to Admission medications   Medication Sig Start Date End Date Taking? Authorizing Provider  amLODipine (NORVASC) 10 MG tablet Take 1 tablet (10 mg total) by mouth daily. 08/01/13   Wendall Stade, MD  clonazePAM (KLONOPIN) 0.5 MG tablet Take 1 tablet (0.5 mg total) by mouth 2 (two) times daily as needed for anxiety. 04/22/15   Molpus, Jonny Ruiz, MD  clonazePAM (KLONOPIN) 0.5 MG tablet Take 1 tablet (0.5 mg total) by mouth 2 (two) times daily as needed for anxiety. 04/09/16   Isa Rankin, MD  dicyclomine (BENTYL) 20 MG tablet Take 1 tablet (20 mg total) by mouth every 8 (eight) hours as needed for spasms (Abdominal cramping). 07/14/17   Ward, Layla Maw, DO  hydrochlorothiazide (HYDRODIURIL) 25 MG tablet Take 1 tablet (25 mg total) by mouth daily. 06/20/13   Wendall Stade, MD  ibuprofen (ADVIL,MOTRIN) 800 MG tablet Take 1 tablet (800 mg total) by mouth 3 (three) times daily. 02/09/16   Domenick Gong, MD  indomethacin (INDOCIN) 50  MG capsule Take 1 capsule (50 mg total) by mouth 2 (two) times daily with a meal for 7 days. 10/11/17 10/18/17  Pia Mau M, PA-C  ipratropium (ATROVENT) 0.06 % nasal spray Place 2 sprays into both nostrils 4 (four) times daily. 3-4 times/ day 02/09/16   Domenick Gong, MD  loperamide (IMODIUM) 2 MG capsule Take 1 capsule (2 mg total) by mouth 4 (four) times daily as needed for diarrhea or loose stools. 07/14/17   Ward, Layla Maw, DO  methocarbamol (ROBAXIN) 500 MG tablet Take 1 tablet (500 mg total) by mouth every 8 (eight) hours as needed for up to 4 days for muscle spasms. 10/11/17 10/15/17  Orvil Feil, PA-C  metoprolol succinate (TOPROL-XL) 25 MG 24 hr tablet Take 1 tablet (25 mg total) by mouth daily. 12/06/15   Rolland Porter, MD  naproxen (NAPROSYN) 500 MG tablet Take 1 tablet (500 mg total) 2 (two) times daily by mouth. 04/02/17   Horton, Mayer Masker, MD  ondansetron (ZOFRAN ODT) 4 MG disintegrating tablet Take 1 tablet (4 mg total) by mouth every 6 (six) hours as needed for nausea or vomiting. 07/14/17   Ward, Layla Maw, DO  propranolol (INDERAL) 20 MG tablet Take 1 tablet (20 mg total) by mouth 3 (three) times daily as needed. 04/09/16 05/09/16  Isa Rankin, MD    Allergies Lisinopril  History reviewed. No pertinent family history.  Social History Social History   Tobacco Use  . Smoking status: Former Smoker  Last attempt to quit: 03/02/2014    Years since quitting: 3.6  . Smokeless tobacco: Never Used  Substance Use Topics  . Alcohol use: Yes    Alcohol/week: 0.0 oz    Comment: rarely  . Drug use: No     Review of Systems  Constitutional: No fever/chills Eyes: No visual changes. No discharge ENT: No upper respiratory complaints. Cardiovascular: no chest pain. Respiratory: no cough. No SOB. Gastrointestinal: No abdominal pain.  No nausea, no vomiting.  No diarrhea.  No constipation. Genitourinary: Negative for dysuria. No hematurias Musculoskeletal: Patient has  reproducible tenderness to palpation over costochondral joints. Skin: Negative for rash, abrasions, lacerations, ecchymosis. Neurological: Negative for headaches, focal weakness or numbness.   ____________________________________________   PHYSICAL EXAM:  VITAL SIGNS: ED Triage Vitals  Enc Vitals Group     BP 10/11/17 1847 (!) 144/83     Pulse Rate 10/11/17 1847 64     Resp 10/11/17 1847 14     Temp 10/11/17 1847 98.1 F (36.7 C)     Temp Source 10/11/17 1847 Oral     SpO2 10/11/17 1847 98 %     Weight 10/11/17 1847 197 lb (89.4 kg)     Height 10/11/17 1847  (1.651 m)     Head Circumference --      Peak Flow --      Pain Score 10/11/17 1851 4     Pain Loc --      Pain Edu? --      Excl. in GC? --      Constitutional: Alert and oriented. Well appearing and in no acute distress. Eyes: Conjunctivae are normal. PERRL. EOMI. Head: Atraumatic. Cardiovascular: Normal rate, regular rhythm. Normal S1 and S2.  Good peripheral circulation.  Patient has reproducible anterior chest wall pain to palpation. Respiratory: Normal respiratory effort without tachypnea or retractions. Lungs CTAB. Good air entry to the bases with no decreased or absent breath sounds. Gastrointestinal: Bowel sounds 4 quadrants. Soft and nontender to palpation. No guarding or rigidity. No palpable masses. No distention. No CVA tenderness. Musculoskeletal: Full range of motion to all extremities. No gross deformities appreciated. Neurologic:  Normal speech and language. No gross focal neurologic deficits are appreciated.  Skin:  Skin is warm, dry and intact. No rash noted. _________________________________________   LABS (all labs ordered are listed, but only abnormal results are displayed)  Labs Reviewed - No data to display ____________________________________________  EKG   ____________________________________________  RADIOLOGY   No results  found.  ____________________________________________    PROCEDURES  Procedure(s) performed:    Procedures    Medications - No data to display   ____________________________________________   INITIAL IMPRESSION / ASSESSMENT AND PLAN / ED COURSE  Pertinent labs & imaging results that were available during my care of the patient were reviewed by me and considered in my medical decision making (see chart for details).  Review of the [place] CSRS was performed in accordance of the [facility] prior to dispensing any controlled drugs.     Assessment and plan Costochondritis Patient presents to the emergency department with reproducible anterior and posterior chest wall tenderness to palpation over costochondral joints.  Patient was discharged with Indocin and Robaxin.  He was advised to follow-up with primary care as needed.  All patient questions were answered.    ____________________________________________  FINAL CLINICAL IMPRESSION(S) / ED DIAGNOSES  Final diagnoses:  Costochondritis, acute  Muscle spasm      NEW MEDICATIONS STARTED DURING THIS VISIT:  ED  Discharge Orders        Ordered    indomethacin (INDOCIN) 50 MG capsule  2 times daily with meals     10/11/17 2018    methocarbamol (ROBAXIN) 500 MG tablet  Every 8 hours PRN     10/11/17 2018          This chart was dictated using voice recognition software/Dragon. Despite best efforts to proofread, errors can occur which can change the meaning. Any change was purely unintentional.    Orvil Feil, PA-C 10/11/17 2247    Arnaldo Natal, MD 10/12/17 0040

## 2017-10-11 NOTE — ED Triage Notes (Signed)
Pt to ED from home c/o neck pain radiating behind right ear also pain to left shoulder blade area worse with movement.  Pt A&Ox4, speaking in complete and coherent sentences, chest rise even and unlabored.  Gets better with pressing on muscle.

## 2017-12-23 ENCOUNTER — Encounter: Payer: Self-pay | Admitting: Emergency Medicine

## 2017-12-23 ENCOUNTER — Emergency Department
Admission: EM | Admit: 2017-12-23 | Discharge: 2017-12-23 | Disposition: A | Discharge status: Home / Self Care | Payer: Worker's Compensation | Attending: Emergency Medicine | Admitting: Emergency Medicine

## 2017-12-23 DIAGNOSIS — S61011A Laceration without foreign body of right thumb without damage to nail, initial encounter: Secondary | ICD-10-CM | POA: Diagnosis present

## 2017-12-23 DIAGNOSIS — Z23 Encounter for immunization: Secondary | ICD-10-CM | POA: Diagnosis not present

## 2017-12-23 DIAGNOSIS — Z79899 Other long term (current) drug therapy: Secondary | ICD-10-CM | POA: Insufficient documentation

## 2017-12-23 DIAGNOSIS — Y9389 Activity, other specified: Secondary | ICD-10-CM | POA: Insufficient documentation

## 2017-12-23 DIAGNOSIS — Y9289 Other specified places as the place of occurrence of the external cause: Secondary | ICD-10-CM | POA: Diagnosis not present

## 2017-12-23 DIAGNOSIS — Z87891 Personal history of nicotine dependence: Secondary | ICD-10-CM | POA: Insufficient documentation

## 2017-12-23 DIAGNOSIS — W268XXA Contact with other sharp object(s), not elsewhere classified, initial encounter: Secondary | ICD-10-CM | POA: Insufficient documentation

## 2017-12-23 DIAGNOSIS — I10 Essential (primary) hypertension: Secondary | ICD-10-CM | POA: Diagnosis not present

## 2017-12-23 DIAGNOSIS — Y990 Civilian activity done for income or pay: Secondary | ICD-10-CM | POA: Insufficient documentation

## 2017-12-23 MED ORDER — CEPHALEXIN 500 MG PO CAPS: 1000.0000 mg | ORAL_CAPSULE | Freq: Two times a day (BID) | ORAL | 0 refills | Status: DC

## 2017-12-23 MED ORDER — TETANUS-DIPHTH-ACELL PERTUSSIS 5-2.5-18.5 LF-MCG/0.5 IM SUSP
0.5000 mL | Freq: Once | INTRAMUSCULAR | Status: AC
  Administered 2017-12-23: 0.5 mL via INTRAMUSCULAR
  Filled 2017-12-23: qty 0.5

## 2017-12-23 NOTE — ED Notes (Signed)
Pt states that he has a laceration on his right thumb. 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 Pt is alert and oriented x 4. Thumb is not actively bleeding at this moment.

## 2017-12-23 NOTE — ED Notes (Addendum)
Notes entered by this writer on 12/23/2017  At 2036  Charted in error  Please disregard

## 2017-12-23 NOTE — ED Triage Notes (Signed)
[facility]; pt says incident occurred around 6pm; WC to be filed;

## 2017-12-23 NOTE — ED Provider Notes (Signed)
[facility] Emergency Department Provider Note  ____________________________________________  Time seen: Approximately 8:19 PM  I have reviewed the triage vital signs and the nursing notes.   HISTORY  Chief Complaint Laceration    HPI Nathan Nelson is a 32 y.o. male who presents the emergency department complaining of laceration to the distal right thumb.  Patient was working [facility] [employer] when he accidentally cut his thumb on a piece of metal.  Patient reports that he is a metal cut him through his gloves.  He had a difficult time stopping bleeding initially and was sent to the emergency department.  On arrival, bleeding had ceased.  Patient reports good range of motion to the digit.  He is unsure of his last tetanus shot.  No other injury or complaint.  No medications prior to arrival.    Past Medical History:  Diagnosis Date  . Anxiety   . Hypertension     Patient Active Problem List   Diagnosis Date Noted  . Right shoulder injury 09/28/2015  . Costochondritis 06/11/2014  . URI (upper respiratory infection) 05/30/2012  . Hypertension     Past Surgical History:  Procedure Laterality Date  . KNEE SURGERY      Prior to Admission medications   Medication Sig Start Date End Date Taking? Authorizing Provider  clonazePAM (KLONOPIN) 0.5 MG tablet Take 1 tablet (0.5 mg total) by mouth 2 (two) times daily as needed for anxiety. 04/22/15  Yes Molpus, John, MD  hydrochlorothiazide (HYDRODIURIL) 25 MG tablet Take 1 tablet (25 mg total) by mouth daily. 06/20/13  Yes Wendall StadeNishan, Peter C, MD  ibuprofen (ADVIL,MOTRIN) 800 MG tablet Take 1 tablet (800 mg total) by mouth 3 (three) times daily. 02/09/16  Yes Domenick GongMortenson, Ashley, MD  metoprolol succinate (TOPROL-XL) 25 MG 24 hr tablet Take 1 tablet (25 mg total) by mouth daily. 12/06/15  Yes Rolland PorterJames, Mark, MD  cephALEXin (KEFLEX) 500 MG capsule Take 2 capsules (1,000 mg total) by mouth 2 (two) times daily. 12/23/17   Cuthriell, Delorise RoyalsJonathan  D, PA-C    Allergies Lisinopril  History reviewed. No pertinent family history.  Social History Social History   Tobacco Use  . Smoking status: Former Smoker    Last attempt to quit: 03/02/2014    Years since quitting: 3.8  . Smokeless tobacco: Never Used  Substance Use Topics  . Alcohol use: Yes    Alcohol/week: 0.0 standard drinks    Comment: rarely  . Drug use: No     Review of Systems  Constitutional: No fever/chills Eyes: No visual changes.  Cardiovascular: no chest pain. Respiratory: no cough. No SOB. Gastrointestinal: No abdominal pain.  No nausea, no vomiting.   Musculoskeletal: Negative for musculoskeletal pain. Skin: Positive for thumb laceration to R thumb Neurological: Negative for headaches, focal weakness or numbness. 10-point ROS otherwise negative.  ____________________________________________   PHYSICAL EXAM:  VITAL SIGNS: ED Triage Vitals  Enc Vitals Group     BP 12/23/17 1938 (!) 137/94     Pulse Rate 12/23/17 1938 71     Resp 12/23/17 1938 18     Temp 12/23/17 1938 98.4 F (36.9 C)     Temp Source 12/23/17 1938 Oral     SpO2 12/23/17 1938 99 %     Weight 12/23/17 1939 198 lb (89.8 kg)     Height 12/23/17 1939 5\' 4"  (1.626 m)     Head Circumference --      Peak Flow --      Pain  Score 12/23/17 1938 3     Pain Loc --      Pain Edu? --      Excl. in GC? --      Constitutional: Alert and oriented. Well appearing and in no acute distress. Eyes: Conjunctivae are normal. PERRL. EOMI. Head: Atraumatic. Neck: No stridor.    Cardiovascular: Normal rate, regular rhythm. Normal S1 and S2.  Good peripheral circulation. Respiratory: Normal respiratory effort without tachypnea or retractions. Lungs CTAB. Good air entry to the bases with no decreased or absent breath sounds. Musculoskeletal: Full range of motion to all extremities. No gross deformities appreciated. Neurologic:  Normal speech and language. No gross focal neurologic deficits are  appreciated.  Skin:  Skin is warm, dry and intact. No rash noted.  Superficial laceration noted to the distal right thumb.  Area measures less than 0.5 cm in length.  No bleeding.  No foreign body.  This does not involve the nailbed.  Full range of motion to the digit.  Sensation and capillary refill intact to the digit. Psychiatric: Mood and affect are normal. Speech and behavior are normal. Patient exhibits appropriate insight and judgement.   ____________________________________________   LABS (all labs ordered are listed, but only abnormal results are displayed)  Labs Reviewed - No data to display ____________________________________________  EKG   ____________________________________________  RADIOLOGY   No results found.  ____________________________________________    PROCEDURES  Procedure(s) performed:    Marland KitchenMarland KitchenLaceration Repair Date/Time: 12/23/2017 8:43 PM Performed by: Racheal Patches, PA-C Authorized by: Racheal Patches, PA-C   Consent:    Consent obtained:  Verbal   Consent given by:  Patient   Risks discussed:  Infection Anesthesia (see MAR for exact dosages):    Anesthesia method:  None Laceration details:    Location:  Finger   Finger location:  R thumb   Length (cm):  0.5 Repair type:    Repair type:  Simple Exploration:    Hemostasis achieved with:  Direct pressure   Wound exploration: wound explored through full range of motion and entire depth of wound probed and visualized     Wound extent: no foreign bodies/material noted, no muscle damage noted, no nerve damage noted, no tendon damage noted, no underlying fracture noted and no vascular damage noted     Contaminated: no   Treatment:    Area cleansed with:  Betadine   Amount of cleaning:  Extensive Post-procedure details:    Dressing:  Non-adherent dressing and tube gauze   Patient tolerance of procedure:  Tolerated well, no immediate complications      Medications  Tdap  (BOOSTRIX) injection 0.5 mL (0.5 mLs Intramuscular Given 12/23/17 2057)     ____________________________________________   INITIAL IMPRESSION / ASSESSMENT AND PLAN / ED COURSE  Pertinent labs & imaging results that were available during my care of the patient were reviewed by me and considered in my medical decision making (see chart for details).  Review of the [place] CSRS was performed in accordance of the [facility] prior to dispensing any controlled drugs.      Patient's diagnosis is consistent with laceration to the right thumb.  Patient presents the emergency department after cutting his thumb on a piece of machinery at work.  Patient had difficulty controlling the bleeding prior to arrival, however bleeding had good cessation on my evaluation.  Wound is treated as described above.  Wound care instructions provided to patient.  Tetanus shot updated at this visit.  Patient will be discharged  home with prescriptions for antibiotics prophylactically. Patient is to follow up with primary care as needed or otherwise directed. Patient is given ED precautions to return to the ED for any worsening or new symptoms.     ____________________________________________  FINAL CLINICAL IMPRESSION(S) / ED DIAGNOSES  Final diagnoses:  Laceration of right thumb without foreign body without damage to nail, initial encounter      NEW MEDICATIONS STARTED DURING THIS VISIT:  ED Discharge Orders         Ordered    cephALEXin (KEFLEX) 500 MG capsule  2 times daily     12/23/17 2057              This chart was dictated using voice recognition software/Dragon. Despite best efforts to proofread, errors can occur which can change the meaning. Any change was purely unintentional.    Racheal Patches, PA-C 12/23/17 2100    Dionne Bucy, MD 12/23/17 2210

## 2018-07-28 ENCOUNTER — Ambulatory Visit
Admission: EM | Admit: 2018-07-28 | Discharge: 2018-07-28 | Disposition: A | Discharge status: Home / Self Care | Payer: Worker's Compensation | Attending: Family Medicine | Admitting: Family Medicine

## 2018-07-28 ENCOUNTER — Other Ambulatory Visit: Payer: Self-pay

## 2018-07-28 ENCOUNTER — Encounter: Payer: Self-pay | Admitting: Gynecology

## 2018-07-28 DIAGNOSIS — S161XXA Strain of muscle, fascia and tendon at neck level, initial encounter: Secondary | ICD-10-CM | POA: Diagnosis not present

## 2018-07-28 DIAGNOSIS — W010XXA Fall on same level from slipping, tripping and stumbling without subsequent striking against object, initial encounter: Secondary | ICD-10-CM

## 2018-07-28 MED ORDER — MELOXICAM 15 MG PO TABS: 15.0000 mg | ORAL_TABLET | Freq: Every day | ORAL | 0 refills | Status: DC | PRN

## 2018-07-28 MED ORDER — TIZANIDINE HCL 4 MG PO CAPS: 4.0000 mg | ORAL_CAPSULE | Freq: Three times a day (TID) | ORAL | 0 refills | Status: DC | PRN

## 2018-07-28 NOTE — ED Triage Notes (Signed)
Per patient at work when he fell on his right side. Per patient with right side neck and shoulder pain.

## 2018-07-28 NOTE — Discharge Instructions (Signed)
Rest. Heat.  Medication as needed.  Take care  Dr. Clodagh Odenthal  

## 2018-07-30 NOTE — ED Provider Notes (Signed)
[facility]    CSN: [id number] Arrival date & time: 07/28/18  1344  History   Chief Complaint Neck pain  HPI  33 year old male presents for evaluation of neck pain.  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  Patient states that he got his foot tangled up in a strap while moving a palate.  He subsequently fell on his side.  After the injury, he noted right-sided neck pain.  Patient states that his pain is worse with range of motion.  No relieving factors.  No medications or interventions tried.  No radicular symptoms.  His pain is currently moderate in severity at 7/10.  No other reported symptoms.  No other complaints.  PMH, Surgical Hx, Family Hx, Social History reviewed and updated as below.  Past Medical History:  Diagnosis Date  . Anxiety   . Hypertension     Patient Active Problem List   Diagnosis Date Noted  . Right shoulder injury 09/28/2015  . Costochondritis 06/11/2014  . URI (upper respiratory infection) 05/30/2012  . Hypertension     Past Surgical History:  Procedure Laterality Date  . KNEE SURGERY      Home Medications    Prior to Admission medications   Medication Sig Start Date End Date Taking? Authorizing Provider  amLODipine (NORVASC) 10 MG tablet Take by mouth. 11/23/16 05/02/19 Yes [provider]  chlorthalidone (HYGROTON) 50 MG tablet Take by mouth. 05/23/18 05/23/19 Yes [provider]  escitalopram (LEXAPRO) 5 MG tablet Take by mouth. 05/02/18 07/31/18 Yes [provider]  meloxicam (MOBIC) 15 MG tablet Take 1 tablet (15 mg total) by mouth daily as needed for pain. 07/28/18   Tommie Sams, DO  tiZANidine (ZANAFLEX) 4 MG capsule Take 1 capsule (4 mg total) by mouth 3 (three) times daily as needed for muscle spasms. 07/28/18   Tommie Sams, DO    Family History Family History  Problem Relation Age of Onset  . Hypertension Mother   . Diabetes Father   . Hypertension Father     Social History Social History    Tobacco Use  . Smoking status: Former Smoker    Last attempt to quit: 03/02/2014    Years since quitting: 4.4  . Smokeless tobacco: Never Used  Substance Use Topics  . Alcohol use: Yes    Alcohol/week: 0.0 standard drinks    Comment: rarely  . Drug use: No     Allergies   Lisinopril   Review of Systems Review of Systems  Constitutional: Negative.   Musculoskeletal: Positive for neck pain.   Physical Exam Triage Vital Signs ED Triage Vitals  Enc Vitals Group     BP 07/28/18 1410 133/80     Pulse Rate 07/28/18 1410 98     Resp 07/28/18 1410 16     Temp 07/28/18 1410 98.2 F (36.8 C)     Temp Source 07/28/18 1410 Oral     SpO2 07/28/18 1410 99 %     Weight 07/28/18 1409 201 lb (91.2 kg)     Height 07/28/18 1409 5\' 4"  (1.626 m)     Head Circumference --      Peak Flow --      Pain Score 07/28/18 1409 7     Pain Loc --      Pain Edu? --      Excl. in GC? --    Updated Vital Signs BP 133/80 (BP Location: Left Arm)   Pulse 98   Temp 98.2 F (36.8  C) (Oral)   Resp 16   Ht 5\' 4"  (1.626 m)   Wt 91.2 kg   SpO2 99%   BMI 34.50 kg/m   Visual Acuity Right Eye Distance:   Left Eye Distance:   Bilateral Distance:    Right Eye Near:   Left Eye Near:    Bilateral Near:     Physical Exam Vitals signs and nursing note reviewed.  Constitutional:      General: He is not in acute distress.    Appearance: Normal appearance.  HENT:     Head: Normocephalic and atraumatic.  Eyes:     General: No scleral icterus.       Right eye: No discharge.        Left eye: No discharge.     Conjunctiva/sclera: Conjunctivae normal.  Neck:     Comments: Patient endorsing tenderness over the right paraspinal musculature of the neck.  No spasm noted. Cardiovascular:     Rate and Rhythm: Normal rate and regular rhythm.  Pulmonary:     Effort: Pulmonary effort is normal.     Breath sounds: Normal breath sounds.  Neurological:     Mental Status: He is alert.  Psychiatric:         Mood and Affect: Mood normal.        Behavior: Behavior normal.    UC Treatments / Results  Labs (all labs ordered are listed, but only abnormal results are displayed) Labs Reviewed - No data to display  EKG None  Radiology No results found.  Procedures Procedures (including critical care time)  Medications Ordered in UC Medications - No data to display  Initial Impression / Assessment and Plan / UC Course  I have reviewed the triage vital signs and the nursing notes.  Pertinent labs & imaging results that were available during my care of the patient were reviewed by me and considered in my medical decision making (see chart for details).    33 year old male presents with a neck strain.  Advised rest and heat.  Meloxicam and Zanaflex as directed.  Workmen's Compensation form filled out.  Final Clinical Impressions(s) / UC Diagnoses   Final diagnoses:  Strain of neck muscle, initial encounter     Discharge Instructions     Rest.   Heat.  Medication as needed.  Take care  Dr. Adriana Simas    ED Prescriptions    Medication Sig Dispense Auth. Provider   meloxicam (MOBIC) 15 MG tablet Take 1 tablet (15 mg total) by mouth daily as needed for pain. 30 tablet Ronya Gilcrest G, DO   tiZANidine (ZANAFLEX) 4 MG capsule Take 1 capsule (4 mg total) by mouth 3 (three) times daily as needed for muscle spasms. 30 capsule Tommie Sams, DO     Controlled Substance Prescriptions [place] Controlled Substance Registry consulted? Not Applicable   Tommie Sams, [place] 07/30/18 [phone]

## 2018-08-06 ENCOUNTER — Ambulatory Visit
Admission: RE | Admit: 2018-08-06 | Discharge: 2018-08-06 | Disposition: A | Discharge status: Home / Self Care | Payer: No Typology Code available for payment source | Source: Ambulatory Visit | Attending: Family Medicine | Admitting: Family Medicine

## 2018-08-06 ENCOUNTER — Ambulatory Visit
Admission: RE | Admit: 2018-08-06 | Discharge: 2018-08-06 | Disposition: A | Discharge status: Home / Self Care | Payer: No Typology Code available for payment source | Attending: Family Medicine | Admitting: Family Medicine

## 2018-08-06 ENCOUNTER — Other Ambulatory Visit: Payer: Self-pay | Admitting: Family Medicine

## 2018-08-06 ENCOUNTER — Other Ambulatory Visit: Payer: Self-pay

## 2018-08-06 DIAGNOSIS — T1490XA Injury, unspecified, initial encounter: Secondary | ICD-10-CM | POA: Insufficient documentation

## 2018-08-06 DIAGNOSIS — R52 Pain, unspecified: Secondary | ICD-10-CM | POA: Diagnosis not present

## 2018-08-06 DIAGNOSIS — M898X1 Other specified disorders of bone, shoulder: Secondary | ICD-10-CM | POA: Diagnosis present

## 2018-09-19 DIAGNOSIS — M25511 Pain in right shoulder: Secondary | ICD-10-CM | POA: Insufficient documentation

## 2019-02-27 DIAGNOSIS — S161XXA Strain of muscle, fascia and tendon at neck level, initial encounter: Secondary | ICD-10-CM | POA: Insufficient documentation

## 2019-03-18 ENCOUNTER — Other Ambulatory Visit: Payer: Self-pay

## 2019-03-18 ENCOUNTER — Emergency Department
Admission: EM | Admit: 2019-03-18 | Discharge: 2019-03-18 | Disposition: A | Discharge status: Home / Self Care | Payer: Self-pay | Attending: Emergency Medicine | Admitting: Emergency Medicine

## 2019-03-18 ENCOUNTER — Encounter: Payer: Self-pay | Admitting: *Deleted

## 2019-03-18 DIAGNOSIS — Z87891 Personal history of nicotine dependence: Secondary | ICD-10-CM | POA: Insufficient documentation

## 2019-03-18 DIAGNOSIS — F1010 Alcohol abuse, uncomplicated: Secondary | ICD-10-CM

## 2019-03-18 DIAGNOSIS — Z79899 Other long term (current) drug therapy: Secondary | ICD-10-CM | POA: Insufficient documentation

## 2019-03-18 DIAGNOSIS — R112 Nausea with vomiting, unspecified: Secondary | ICD-10-CM | POA: Insufficient documentation

## 2019-03-18 DIAGNOSIS — I10 Essential (primary) hypertension: Secondary | ICD-10-CM | POA: Insufficient documentation

## 2019-03-18 LAB — CBC
HCT: 46.3 % (ref 39.0–52.0)
Hemoglobin: 15.6 g/dL (ref 13.0–17.0)
MCH: 29.1 pg (ref 26.0–34.0)
MCHC: 33.7 g/dL (ref 30.0–36.0)
MCV: 86.4 fL (ref 80.0–100.0)
Platelets: 464 10*3/uL — ABNORMAL HIGH (ref 150–400)
RBC: 5.36 MIL/uL (ref 4.22–5.81)
RDW: 12.7 % (ref 11.5–15.5)
WBC: 25.9 10*3/uL — ABNORMAL HIGH (ref 4.0–10.5)
nRBC: 0 % (ref 0.0–0.2)

## 2019-03-18 LAB — COMPREHENSIVE METABOLIC PANEL
ALT: 28 U/L (ref 0–44)
AST: 29 U/L (ref 15–41)
Albumin: 5 g/dL (ref 3.5–5.0)
Alkaline Phosphatase: 87 U/L (ref 38–126)
Anion gap: 22 — ABNORMAL HIGH (ref 5–15)
BUN: 18 mg/dL (ref 6–20)
CO2: 24 mmol/L (ref 22–32)
Calcium: 9.8 mg/dL (ref 8.9–10.3)
Chloride: 93 mmol/L — ABNORMAL LOW (ref 98–111)
Creatinine, Ser: 1.1 mg/dL (ref 0.61–1.24)
GFR calc Af Amer: >60 mL/min (ref >60)
GFR calc non Af Amer: >60 mL/min (ref >60)
Glucose, Bld: 64 mg/dL — ABNORMAL LOW (ref 70–99)
Potassium: 3.1 mmol/L — ABNORMAL LOW (ref 3.5–5.1)
Sodium: 139 mmol/L (ref 135–145)
Total Bilirubin: 0.8 mg/dL (ref 0.3–1.2)
Total Protein: 9.3 g/dL — ABNORMAL HIGH (ref 6.5–8.1)

## 2019-03-18 LAB — TROPONIN I (HIGH SENSITIVITY): Troponin I (High Sensitivity): 4 ng/L (ref <18)

## 2019-03-18 LAB — LIPASE, BLOOD: Lipase: 17 U/L (ref 11–51)

## 2019-03-18 MED ORDER — POTASSIUM CHLORIDE 20 MEQ/15ML (10%) PO SOLN
40.0000 meq | Freq: Once | ORAL | Status: AC
  Administered 2019-03-18: 21:00:00 40 meq via ORAL
  Filled 2019-03-18: qty 30

## 2019-03-18 MED ORDER — ONDANSETRON 4 MG PO TBDP: 4.0000 mg | ORAL_TABLET | Freq: Three times a day (TID) | ORAL | 0 refills | Status: DC | PRN

## 2019-03-18 MED ORDER — ONDANSETRON HCL 4 MG/2ML IJ SOLN
4.0000 mg | Freq: Once | INTRAMUSCULAR | Status: AC
  Administered 2019-03-18: 4 mg via INTRAVENOUS
  Filled 2019-03-18: qty 2

## 2019-03-18 MED ORDER — LACTATED RINGERS IV BOLUS
1000.0000 mL | Freq: Once | INTRAVENOUS | Status: AC
  Administered 2019-03-18: 21:00:00 1000 mL via INTRAVENOUS

## 2019-03-18 NOTE — ED Notes (Signed)
Pt verbally acknowledged discharge instructions. Esign not working.

## 2019-03-18 NOTE — ED Triage Notes (Signed)
Pt to triage via wheelchair.  Pt reports a dry mouth, and emesis today.  Denies any pain. Vomiting x 3 today.  Pt reports drinking etoh last night.  Pt alert  Speech clear.

## 2019-03-18 NOTE — ED Provider Notes (Signed)
[facility] Emergency Department Provider Note   ____________________________________________   First MD Initiated Contact with Patient 03/18/19 2005     (approximate)  I have reviewed the triage vital signs and the nursing notes.   HISTORY  Chief Complaint Emesis    HPI Nathan Nelson is a 33 y.o. male with past medical history of hypertension who presents to the ED complaining of nausea and vomiting.  Patient reports that since around noon this afternoon he has felt persistently nauseous with about 3 episodes of vomiting.  He states emesis is nonbilious and nonbloody and he denies any associated abdominal pain.  He has not had any diarrhea and denies any fevers, cough, chest pain, or shortness of breath.  He does admit to drinking a significant amount of alcohol last night, which he does not usually do.        Past Medical History:  Diagnosis Date  . Anxiety   . Hypertension     Patient Active Problem List   Diagnosis Date Noted  . Right shoulder injury 09/28/2015  . Costochondritis 06/11/2014  . URI (upper respiratory infection) 05/30/2012  . Hypertension     Past Surgical History:  Procedure Laterality Date  . KNEE SURGERY      Prior to Admission medications   Medication Sig Start Date End Date Taking? Authorizing Provider  amLODipine (NORVASC) 10 MG tablet Take by mouth. 11/23/16 05/02/19  [provider]  chlorthalidone (HYGROTON) 50 MG tablet Take by mouth. 05/23/18 05/23/19  [provider]  escitalopram (LEXAPRO) 5 MG tablet Take by mouth. 05/02/18 07/31/18  [provider]  meloxicam (MOBIC) 15 MG tablet Take 1 tablet (15 mg total) by mouth daily as needed for pain. 07/28/18   Tommie Sams, DO  ondansetron (ZOFRAN ODT) 4 MG disintegrating tablet Take 1 tablet (4 mg total) by mouth every 8 (eight) hours as needed for nausea or vomiting. 03/18/19   Chesley Noon, MD  tiZANidine (ZANAFLEX) 4 MG capsule Take 1  capsule (4 mg total) by mouth 3 (three) times daily as needed for muscle spasms. 07/28/18   Tommie Sams, DO    Allergies Lisinopril  Family History  Problem Relation Age of Onset  . Hypertension Mother   . Diabetes Father   . Hypertension Father     Social History Social History   Tobacco Use  . Smoking status: Former Smoker    Quit date: 03/02/2014    Years since quitting: 5.0  . Smokeless tobacco: Never Used  Substance Use Topics  . Alcohol use: Yes    Alcohol/week: 0.0 standard drinks    Comment: rarely  . Drug use: No    Review of Systems  Constitutional: No fever/chills Eyes: No visual changes. ENT: No sore throat. Cardiovascular: Denies chest pain. Respiratory: Denies shortness of breath. Gastrointestinal: No abdominal pain.  Positive for nausea and vomiting.  No diarrhea.  No constipation. Genitourinary: Negative for dysuria. Musculoskeletal: Negative for back pain. Skin: Negative for rash. Neurological: Negative for headaches, focal weakness or numbness.  ____________________________________________   PHYSICAL EXAM:  VITAL SIGNS: ED Triage Vitals [03/18/19 1545]  Enc Vitals Group     BP 137/89     Pulse Rate (!) 106     Resp 20     Temp 97.6 F (36.4 C)     Temp Source Oral     SpO2 98 %     Weight 219 lb (99.3 kg)     Height 5\' 4"  (  1.626 m)     Head Circumference      Peak Flow      Pain Score 0     Pain Loc      Pain Edu?      Excl. in [place]?     Constitutional: Alert and oriented. Eyes: Conjunctivae are normal. Head: Atraumatic. Nose: No congestion/rhinnorhea. Mouth/Throat: Mucous membranes are moist. Neck: Normal ROM Cardiovascular: Normal rate, regular rhythm. Grossly normal heart sounds. Respiratory: Normal respiratory effort.  No retractions. Lungs CTAB. Gastrointestinal: Soft and nontender. No distention. Genitourinary: deferred Musculoskeletal: No lower extremity tenderness nor edema. Neurologic:  Normal speech and language.  No gross focal neurologic deficits are appreciated. Skin:  Skin is warm, dry and intact. No rash noted. Psychiatric: Mood and affect are normal. Speech and behavior are normal.  ____________________________________________   LABS (all labs ordered are listed, but only abnormal results are displayed)  Labs Reviewed  COMPREHENSIVE METABOLIC PANEL - Abnormal; Notable for the following components:      Result Value   Potassium 3.1 (*)    Chloride 93 (*)    Glucose, Bld 64 (*)    Total Protein 9.3 (*)    Anion gap 22 (*)    All other components within normal limits  CBC - Abnormal; Notable for the following components:   WBC 25.9 (*)    Platelets 464 (*)    All other components within normal limits  LIPASE, BLOOD  TROPONIN I (HIGH SENSITIVITY)   ____________________________________________  EKG  ED ECG REPORT I, Blake Divine, the attending physician, personally viewed and interpreted this ECG.   Date: 03/18/2019  EKG Time: 20:40  Rate: 98  Rhythm: normal sinus rhythm  Axis: Normal  Intervals:none  ST&T Change: Minimal ST elevation inferiorly    PROCEDURES  Procedure(s) performed (including Critical Care):  Procedures   ____________________________________________   INITIAL IMPRESSION / ASSESSMENT AND PLAN / ED COURSE       33 year old male with history of hypertension presents to the ED complaining of nausea with 3 episodes of nonbilious and nonbloody emesis since around noon this afternoon.  He denies any associated abdominal pain and has a benign and nonfocal abdominal exam.  Doubt acute intra-abdominal pathology, gastritis from alcohol abuse versus gastroenteritis seem more likely.  Labs are significant for leukocytosis, but suspect this is reactive given his lack of urinary symptoms, respiratory symptoms, or abdominal tenderness.  Will hydrate with fluids and treat with Zofran.  Remainder of labs significant only for mild hypokalemia and hyperglycemia, will  replete potassium and give patient something to eat.  EKG shows minimal ST elevation inferiorly although this does not appear to be significant as it is less than 1 mm and there are no reciprocal changes.  Patient denies any symptoms of ACS, will screen troponin but low suspicion for cardiac etiology.  Troponin within normal limits, doubt ACS.  Patient feeling much better following IV fluid bolus and Zofran, has been able to tolerate fluids here without difficulty.  Doubt acute infectious process and leukocytosis likely reactive.  Counseled patient to follow-up with PCP and return to the ED for new or worsening symptoms, patient agrees with plan.      ____________________________________________   FINAL CLINICAL IMPRESSION(S) / ED DIAGNOSES  Final diagnoses:  Non-intractable vomiting with nausea, unspecified vomiting type  Alcohol abuse     ED Discharge Orders         Ordered    ondansetron (ZOFRAN ODT) 4 MG disintegrating tablet  Every 8 hours  PRN     03/18/19 2154           Note:  This document was prepared using Dragon voice recognition software and may include unintentional dictation errors.   Chesley NoonJessup, Saadiq Poche, MD 03/18/19 2159

## 2019-06-13 ENCOUNTER — Ambulatory Visit: Payer: BC Managed Care – PPO | Attending: Internal Medicine

## 2019-06-13 DIAGNOSIS — Z20822 Contact with and (suspected) exposure to covid-19: Secondary | ICD-10-CM | POA: Insufficient documentation

## 2019-06-14 LAB — NOVEL CORONAVIRUS, NAA: SARS-CoV-2, NAA: NOT DETECTED

## 2019-06-30 ENCOUNTER — Other Ambulatory Visit: Payer: Self-pay

## 2019-07-08 ENCOUNTER — Ambulatory Visit: Payer: Medicaid Other | Attending: Internal Medicine

## 2019-07-08 DIAGNOSIS — Z20822 Contact with and (suspected) exposure to covid-19: Secondary | ICD-10-CM

## 2019-07-09 LAB — NOVEL CORONAVIRUS, NAA: SARS-CoV-2, NAA: NOT DETECTED

## 2020-01-21 DIAGNOSIS — M25562 Pain in left knee: Secondary | ICD-10-CM | POA: Diagnosis not present

## 2020-01-21 DIAGNOSIS — S83242A Other tear of medial meniscus, current injury, left knee, initial encounter: Secondary | ICD-10-CM | POA: Diagnosis not present

## 2020-01-21 DIAGNOSIS — M94262 Chondromalacia, left knee: Secondary | ICD-10-CM | POA: Diagnosis not present

## 2020-03-12 ENCOUNTER — Ambulatory Visit
Admission: EM | Admit: 2020-03-12 | Discharge: 2020-03-12 | Disposition: A | Discharge status: Home / Self Care | Payer: BC Managed Care – PPO | Attending: Family Medicine | Admitting: Family Medicine

## 2020-03-12 ENCOUNTER — Ambulatory Visit (INDEPENDENT_AMBULATORY_CARE_PROVIDER_SITE_OTHER): Payer: BC Managed Care – PPO

## 2020-03-12 DIAGNOSIS — M25511 Pain in right shoulder: Secondary | ICD-10-CM

## 2020-03-12 DIAGNOSIS — M542 Cervicalgia: Secondary | ICD-10-CM | POA: Diagnosis not present

## 2020-03-12 DIAGNOSIS — T148XXA Other injury of unspecified body region, initial encounter: Secondary | ICD-10-CM

## 2020-03-12 DIAGNOSIS — M62838 Other muscle spasm: Secondary | ICD-10-CM | POA: Diagnosis not present

## 2020-03-12 DIAGNOSIS — R0781 Pleurodynia: Secondary | ICD-10-CM | POA: Diagnosis not present

## 2020-03-12 MED ORDER — CYCLOBENZAPRINE HCL 10 MG PO TABS: 10.0000 mg | ORAL_TABLET | Freq: Two times a day (BID) | ORAL | 0 refills | Status: DC | PRN

## 2020-03-12 MED ORDER — IBUPROFEN 600 MG PO TABS: 600.0000 mg | ORAL_TABLET | Freq: Four times a day (QID) | ORAL | 0 refills | Status: DC | PRN

## 2020-03-12 NOTE — Discharge Instructions (Signed)
Your xray was negative today  Take ibuprofen as needed for your pain.    Take the muscle relaxer Flexeril as needed for muscle spasm; Do not drive, operate machinery, or drink alcohol with this medication as it may make you drowsy.    Follow up with your primary care provider or an orthopedist if your pain is not improving.

## 2020-03-12 NOTE — ED Provider Notes (Signed)
[facility]   [id number] 03/12/20 Arrival Time: 1506  EN:IDPOE PAIN  SUBJECTIVE: History from: patient. Nathan Nelson is a 34 y.o. male complains of right neck and shoulder pain that began 3 days ago. Denies a precipitating event or specific injury. Describes the pain as constant and achy in character. Has not attempted OTC treatment for this. Symptoms are made worse with activity.  Denies similar symptoms in the past.  Denies fever, chills, erythema, ecchymosis, effusion, weakness, numbness and tingling, saddle paresthesias, loss of bowel or bladder function.      ROS: As per HPI.  All other pertinent ROS negative.     Past Medical History:  Diagnosis Date  . Anxiety   . Hypertension    Past Surgical History:  Procedure Laterality Date  . KNEE SURGERY     Allergies  Allergen Reactions  . Lisinopril Other (See Comments)    Tightness in chest   No current facility-administered medications on file prior to encounter.   Current Outpatient Medications on File Prior to Encounter  Medication Sig Dispense Refill  . chlorthalidone (HYGROTON) 50 MG tablet Take by mouth.    . escitalopram (LEXAPRO) 5 MG tablet Take by mouth.    . meloxicam (MOBIC) 15 MG tablet Take 1 tablet (15 mg total) by mouth daily as needed for pain. 30 tablet 0  . ondansetron (ZOFRAN ODT) 4 MG disintegrating tablet Take 1 tablet (4 mg total) by mouth every 8 (eight) hours as needed for nausea or vomiting. 12 tablet 0  . tiZANidine (ZANAFLEX) 4 MG capsule Take 1 capsule (4 mg total) by mouth 3 (three) times daily as needed for muscle spasms. 30 capsule 0   Social History   Socioeconomic History  . Marital status: Married    Spouse name: Not on file  . Number of children: Not on file  . Years of education: Not on file  . Highest education level: Not on file  Occupational History  . Not on file  Tobacco Use  . Smoking status: Former Smoker    Quit date: 03/02/2014    Years since quitting: 6.0    . Smokeless tobacco: Never Used  Substance and Sexual Activity  . Alcohol use: Yes    Alcohol/week: 0.0 standard drinks    Comment: rarely  . Drug use: No  . Sexual activity: Not on file  Other Topics Concern  . Not on file  Social History Narrative  . Not on file   Social Determinants of Health   Financial Resource Strain:   . Difficulty of Paying Living Expenses: Not on file  Food Insecurity:   . Worried About Programme researcher, broadcasting/film/video in the Last Year: Not on file  . Ran Out of Food in the Last Year: Not on file  Transportation Needs:   . Lack of Transportation (Medical): Not on file  . Lack of Transportation (Non-Medical): Not on file  Physical Activity:   . Days of Exercise per Week: Not on file  . Minutes of Exercise per Session: Not on file  Stress:   . Feeling of Stress : Not on file  Social Connections:   . Frequency of Communication with Friends and Family: Not on file  . Frequency of Social Gatherings with Friends and Family: Not on file  . Attends Religious Services: Not on file  . Active Member of Clubs or Organizations: Not on file  . Attends Banker Meetings: Not on file  . Marital Status: Not on  file  Intimate Partner Violence:   . Fear of Current or Ex-Partner: Not on file  . Emotionally Abused: Not on file  . Physically Abused: Not on file  . Sexually Abused: Not on file   Family History  Problem Relation Age of Onset  . Hypertension Mother   . Diabetes Father   . Hypertension Father     OBJECTIVE:  Vitals:   03/12/20 1519 03/12/20 1520  BP: 139/89   Pulse: 93   Resp: 16   Temp: 99.3 F (37.4 C)   TempSrc: Oral   SpO2: 95%   Weight:  216 lb (98 kg)    General appearance: ALERT; in no acute distress.  Head: NCAT Lungs: Normal respiratory effort CV: pulses 2+ bilaterally. Cap refill < 2 seconds Musculoskeletal:  Inspection: Skin warm, dry, clear and intact without obvious erythema, effusion, or ecchymosis.  Palpation: R  posterior neck and anterior aspect of R shoulder tender to palpation ROM: limited ROM active and passive to R neck and shoulder Skin: warm and dry Neurologic: Ambulates without difficulty; Sensation intact about the upper/ lower extremities Psychological: alert and cooperative; normal mood and affect  DIAGNOSTIC STUDIES:  DG Clavicle Right  Result Date: 03/12/2020 CLINICAL DATA:  Pain EXAM: RIGHT CLAVICLE - 2+ VIEWS COMPARISON:  August 06, 2018 FINDINGS: There is no evidence of fracture or other focal bone lesions. Soft tissues are unremarkable. IMPRESSION: Negative. Electronically Signed   By: Katherine Mantle M.D.   On: 03/12/2020 16:07     ASSESSMENT & PLAN:  1. Muscle spasm of right lower extremity   2. Neck pain on right side   3. Acute pain of right shoulder   4. Muscle strain      Meds ordered this encounter  Medications  . ibuprofen (ADVIL) 600 MG tablet    Sig: Take 1 tablet (600 mg total) by mouth every 6 (six) hours as needed.    Dispense:  30 tablet    Refill:  0    Order Specific Question:   Supervising Provider    Answer:   Merrilee Jansky [id number]  . cyclobenzaprine (FLEXERIL) 10 MG tablet    Sig: Take 1 tablet (10 mg total) by mouth 2 (two) times daily as needed for muscle spasms.    Dispense:  20 tablet    Refill:  0    Order Specific Question:   Supervising Provider    Answer:   Merrilee Jansky [[id number]]   Xray negative today Continue conservative management of rest, ice, and gentle stretches Take ibuprofen as needed for pain relief (may cause abdominal discomfort, ulcers, and GI bleeds avoid taking with other NSAIDs) Take cyclobenzaprine at nighttime for symptomatic relief. Avoid driving or operating heavy machinery while using medication. Follow up with PCP if symptoms persist Return or go to the ER if you have any new or worsening symptoms (fever, chills, chest pain, abdominal pain, changes in bowel or bladder habits, pain radiating into lower  legs)   Reviewed expectations re: course of current medical issues. Questions answered. Outlined signs and symptoms indicating need for more acute intervention. Patient verbalized understanding. After Visit Summary given.       Moshe Cipro, NP 03/12/20 [phone]

## 2020-03-12 NOTE — ED Triage Notes (Signed)
Pt reports having neck and R shoulder pain x1 week. No new injury to neck or shoulder. "I did not take any tylenol or ibuprofen because it does not work". No other concerns at this time.

## 2020-03-19 ENCOUNTER — Ambulatory Visit: Payer: BC Managed Care – PPO | Admitting: Adult Health

## 2020-03-19 ENCOUNTER — Ambulatory Visit
Admission: RE | Admit: 2020-03-19 | Discharge: 2020-03-19 | Disposition: A | Discharge status: Home / Self Care | Payer: BC Managed Care – PPO | Attending: Adult Health | Admitting: Adult Health

## 2020-03-19 ENCOUNTER — Ambulatory Visit
Admission: RE | Admit: 2020-03-19 | Discharge: 2020-03-19 | Disposition: A | Discharge status: Home / Self Care | Payer: BC Managed Care – PPO | Source: Ambulatory Visit | Attending: Adult Health | Admitting: Adult Health

## 2020-03-19 ENCOUNTER — Encounter: Payer: Self-pay | Admitting: Adult Health

## 2020-03-19 ENCOUNTER — Other Ambulatory Visit: Payer: Self-pay

## 2020-03-19 VITALS — BP 133/92 | HR 100 | Temp 98.3°F | Resp 16 | Ht 64.0 in | Wt 223.8 lb

## 2020-03-19 DIAGNOSIS — R319 Hematuria, unspecified: Secondary | ICD-10-CM | POA: Diagnosis not present

## 2020-03-19 DIAGNOSIS — Z1389 Encounter for screening for other disorder: Secondary | ICD-10-CM | POA: Insufficient documentation

## 2020-03-19 DIAGNOSIS — I10 Essential (primary) hypertension: Secondary | ICD-10-CM | POA: Diagnosis not present

## 2020-03-19 DIAGNOSIS — E559 Vitamin D deficiency, unspecified: Secondary | ICD-10-CM | POA: Insufficient documentation

## 2020-03-19 DIAGNOSIS — M542 Cervicalgia: Secondary | ICD-10-CM | POA: Insufficient documentation

## 2020-03-19 DIAGNOSIS — R0781 Pleurodynia: Secondary | ICD-10-CM | POA: Diagnosis not present

## 2020-03-19 DIAGNOSIS — F419 Anxiety disorder, unspecified: Secondary | ICD-10-CM

## 2020-03-19 DIAGNOSIS — S4991XD Unspecified injury of right shoulder and upper arm, subsequent encounter: Secondary | ICD-10-CM | POA: Insufficient documentation

## 2020-03-19 DIAGNOSIS — R221 Localized swelling, mass and lump, neck: Secondary | ICD-10-CM

## 2020-03-19 DIAGNOSIS — M25511 Pain in right shoulder: Secondary | ICD-10-CM | POA: Diagnosis not present

## 2020-03-19 LAB — POCT URINALYSIS DIPSTICK
Glucose, UA: NEGATIVE
Ketones, UA: NEGATIVE
Leukocytes, UA: NEGATIVE
Nitrite, UA: NEGATIVE
Protein, UA: NEGATIVE
Spec Grav, UA: 1.025 (ref 1.010–1.025)
Urobilinogen, UA: 0.2 E.U./dL
pH, UA: 6 (ref 5.0–8.0)

## 2020-03-19 MED ORDER — DOXYCYCLINE HYCLATE 100 MG PO TABS: 100.0000 mg | ORAL_TABLET | Freq: Two times a day (BID) | ORAL | 0 refills | Status: DC

## 2020-03-19 MED ORDER — PREDNISONE 10 MG (21) PO TBPK: ORAL_TABLET | ORAL | 0 refills | Status: DC

## 2020-03-19 NOTE — Progress Notes (Signed)
New patient visit   Patient: Nathan Nelson   DOB: 11-12-1985   34 y.o. Male  MRN: [id number] Visit Date: 03/19/2020  Today's healthcare provider: Jairo Ben, FNP   Chief Complaint  Patient presents with  . New Patient (Initial Visit)   Subjective    Nathan Nelson is a 34 y.o. male who presents today as a new patient to establish care.  HPI  Patient presents in office today to establish care, he states he feels fairly well but does have a few concerns to address. Patient reports previous injury to right shoulder and for the past several weeks he has experienced pain on the right side of his neck that radiates to shoulder and upper back.   Patient describes pain as achy and sore this is before his fall documented below.. Patient would also like to readdress rib pain that he states has been present for 2 years or more, he denies prior injury or dyspnea. Patient states that he was previously worked up for this in past and had imaging done. 03/12/20 was seen in ER for joint pain , was given flexeril,, and ibuprofen. Clavicle x ray 07/15/2019 was within normal limits at [facility].   Previous smoker - only with drinking on weekend, he stopped it all years ago.  No drug use.  He hurt his shoulder, and did workers compensation and this was March 2020 and he did physical therapy, and he reports it was feeling great and now hurts some. No new injury. Occasionally has right sided neck pain. He fell at work foot got stuck in pallet, and he fell down on right shoulder. He said he had evaluations at [facility] and had shoulder evaluation and x rays, questionable rotator cuff versus tendonitis of shoulder. Had cervical x rays, MRI on shoulder her reports.    He is on Prozac 60 mg daily for a few months from previous PCP. He does report some fatigue, not severe.  Denies any past suicidal or homicidal ideations.  Patient  denies any fever, body aches,chills, rash, chest pain,  shortness of breath, nausea, vomiting, or diarrhea.    Past Medical History:  Diagnosis Date  . Anxiety   . Hypertension    Past Surgical History:  Procedure Laterality Date  . KNEE SURGERY     Family Status  Relation Name Status  . Mother  Alive  . Father  Alive   Family History  Problem Relation Age of Onset  . Hypertension Mother   . Diabetes Father   . Hypertension Father    Social History   Socioeconomic History  . Marital status: Married    Spouse name: Not on file  . Number of children: Not on file  . Years of education: Not on file  . Highest education level: Not on file  Occupational History  . Not on file  Tobacco Use  . Smoking status: Former Smoker    Quit date: 03/02/2014    Years since quitting: 6.0  . Smokeless tobacco: Never Used  Substance and Sexual Activity  . Alcohol use: Yes    Alcohol/week: 0.0 standard drinks    Comment: rarely  . Drug use: No  . Sexual activity: Not on file  Other Topics Concern  . Not on file  Social History Narrative  . Not on file   Social Determinants of Health   Financial Resource Strain:   . Difficulty of Paying Living Expenses: Not on file  Food  Insecurity:   . Worried About Programme researcher, broadcasting/film/video in the Last Year: Not on file  . Ran Out of Food in the Last Year: Not on file  Transportation Needs:   . Lack of Transportation (Medical): Not on file  . Lack of Transportation (Non-Medical): Not on file  Physical Activity:   . Days of Exercise per Week: Not on file  . Minutes of Exercise per Session: Not on file  Stress:   . Feeling of Stress : Not on file  Social Connections:   . Frequency of Communication with Friends and Family: Not on file  . Frequency of Social Gatherings with Friends and Family: Not on file  . Attends Religious Services: Not on file  . Active Member of Clubs or Organizations: Not on file  . Attends Banker Meetings: Not on file  . Marital Status: Not on file   Outpatient  Medications Prior to Visit  Medication Sig  . amLODipine (NORVASC) 10 MG tablet Take 10 mg by mouth daily.  . chlorthalidone (HYGROTON) 50 MG tablet Take by mouth.  Marland Kitchen FLUoxetine (PROZAC) 20 MG capsule Take 60 mg by mouth every morning.  . potassium chloride SA (KLOR-CON) 20 MEQ tablet Take 20 mEq by mouth 2 (two) times daily.  . [DISCONTINUED] cyclobenzaprine (FLEXERIL) 10 MG tablet Take 1 tablet (10 mg total) by mouth 2 (two) times daily as needed for muscle spasms.  . [DISCONTINUED] escitalopram (LEXAPRO) 5 MG tablet Take by mouth.  . [DISCONTINUED] ibuprofen (ADVIL) 600 MG tablet Take 1 tablet (600 mg total) by mouth every 6 (six) hours as needed.  . [DISCONTINUED] meloxicam (MOBIC) 15 MG tablet Take 1 tablet (15 mg total) by mouth daily as needed for pain.  . [DISCONTINUED] ondansetron (ZOFRAN ODT) 4 MG disintegrating tablet Take 1 tablet (4 mg total) by mouth every 8 (eight) hours as needed for nausea or vomiting.  . [DISCONTINUED] tiZANidine (ZANAFLEX) 4 MG capsule Take 1 capsule (4 mg total) by mouth 3 (three) times daily as needed for muscle spasms.   No facility-administered medications prior to visit.   Allergies  Allergen Reactions  . Lisinopril Other (See Comments)    Tightness in chest    Immunization History  Administered Date(s) Administered  . Tdap 06/27/2011, 12/23/2017    Health Maintenance  Topic Date Due  . Hepatitis C Screening  Never done  . COVID-19 Vaccine (1) Never done  . HIV Screening  Never done  . INFLUENZA VACCINE  12/14/2019  . TETANUS/TDAP  12/24/2027    Patient Care Team: Berniece Pap, FNP as PCP - General (Family Medicine)  Review of Systems  Constitutional: Positive for fatigue and unexpected weight change (slight weight gain ). Negative for activity change, appetite change, chills, diaphoresis and fever.  HENT: Negative.   Eyes: Positive for photophobia. Negative for pain, discharge, redness, itching and visual disturbance.    Respiratory: Negative.   Cardiovascular: Negative.   Gastrointestinal: Negative.   Musculoskeletal: Negative.   Neurological: Negative.   Psychiatric/Behavioral: Negative.       Objective    BP (!) 133/92   Pulse 100   Temp 98.3 F (36.8 C) (Oral)   Resp 16   Ht 5\' 4"  (1.626 m)   Wt 223 lb 12.8 oz (101.5 kg)   BMI 38.42 kg/m  Physical Exam Constitutional:      General: He is not in acute distress.    Appearance: Normal appearance. He is obese. He is not ill-appearing, toxic-appearing or  diaphoretic.  HENT:     Head: Normocephalic and atraumatic. No raccoon eyes.     Jaw: There is normal jaw occlusion.      Right Ear: Tympanic membrane, ear canal and external ear normal. There is no impacted cerumen.     Left Ear: Tympanic membrane, ear canal and external ear normal. There is no impacted cerumen.     Nose: Nose normal. No congestion or rhinorrhea.     Mouth/Throat:     Mouth: Mucous membranes are moist.     Pharynx: No oropharyngeal exudate or posterior oropharyngeal erythema.  Eyes:     Conjunctiva/sclera: Conjunctivae normal.     Pupils: Pupils are equal, round, and reactive to light.  Neck:     Vascular: No carotid bruit.  Cardiovascular:     Rate and Rhythm: Normal rate and regular rhythm.     Pulses: Normal pulses.     Heart sounds: Normal heart sounds. No murmur heard.  No friction rub. No gallop.   Pulmonary:     Effort: Pulmonary effort is normal.     Breath sounds: Normal breath sounds.  Chest:     Chest wall: Mass and swelling present. No tenderness.    Abdominal:     General: There is no distension.     Palpations: Abdomen is soft.     Tenderness: There is no abdominal tenderness. There is no guarding.  Genitourinary:    Comments: Deferred.  Musculoskeletal:        General: Normal range of motion.     Cervical back: Normal range of motion and neck supple. No rigidity or tenderness.  Lymphadenopathy:     Cervical: Cervical adenopathy present.   Skin:    General: Skin is warm.     Capillary Refill: Capillary refill takes less than 2 seconds.     Findings: No rash.  Neurological:     General: No focal deficit present.     Mental Status: He is alert and oriented to person, place, and time.     Motor: No weakness.     Gait: Gait normal.  Psychiatric:        Mood and Affect: Mood normal.        Behavior: Behavior normal.        Thought Content: Thought content normal.        Judgment: Judgment normal.     Depression Screen PHQ 2/9 Scores 03/19/2020  PHQ - 2 Score 4  PHQ- 9 Score 11   Results for orders placed or performed in visit on 03/19/20  Urine Culture   Specimen: Urine, Clean Catch   Urine  Result Value Ref Range   Urine Culture, Routine Final report    Organism ID, Bacteria No growth   Urine Microscopic  Result Value Ref Range   WBC, UA None seen 0 - 5 /hpf   RBC None seen 0 - 2 /hpf   Epithelial Cells (non renal) None seen 0 - 10 /hpf   Casts None seen None seen /lpf   Bacteria, UA None seen None seen/Few  POCT urinalysis dipstick  Result Value Ref Range   Color, UA yellow    Clarity, UA clear    Glucose, UA Negative Negative   Bilirubin, UA trace    Ketones, UA negative    Spec Grav, UA 1.025 1.010 - 1.025   Blood, UA large    pH, UA 6.0 5.0 - 8.0   Protein, UA Negative Negative   Urobilinogen, UA 0.2  0.2 or 1.0 E.U./dL   Nitrite, UA negative    Leukocytes, UA Negative Negative   Appearance     Odor      Assessment & Plan     Hypertension, unspecified type - Plan: CBC with Differential/Platelet, Comprehensive metabolic panel, Lipid panel, TSH  Screening for blood or protein in urine - Plan: POCT urinalysis dipstick  Anxiety  Injury of right shoulder, subsequent encounter - Plan: DG Shoulder Right  Rib pain on right side - Plan: DG Chest 2 View, DG Ribs Unilateral Right, DG Ribs Unilateral Left  Rib pain on left side - Plan: DG Chest 2 View, DG Ribs Unilateral Right, DG Ribs Unilateral  Left  Vitamin D deficiency - Plan: VITAMIN D 25 Hydroxy (Vit-D Deficiency, Fractures)  Cervical pain - Plan: DG Cervical Spine Complete  Mass of right side of neck - Plan: CT Soft Tissue Neck W Contrast  Hematuria, unspecified type - Plan: Urine Microscopic, Urine Culture   Orders Placed This Encounter  Procedures  . Urine Culture  . DG Chest 2 View  . DG Ribs Unilateral Right  . DG Ribs Unilateral Left  . DG Cervical Spine Complete  . DG Shoulder Right  . CT Soft Tissue Neck W Contrast  . CBC with Differential/Platelet  . Comprehensive metabolic panel  . Lipid panel  . TSH  . VITAMIN D 25 Hydroxy (Vit-D Deficiency, Fractures)  . Urine Microscopic  . POCT urinalysis dipstick     Return in about 2 weeks (around 04/02/2020) for at any time for any worsening symptoms, Go to Emergency room/ urgent care if worse.    Red Flags discussed. The patient was given clear instructions to go to ER or return to medical center if any red flags develop, symptoms do not improve, worsen or new problems develop. They verbalized understanding.  Mass right side neck/ chest suspect lipoma, however given his symptoms of rib pain over 1 year, shoulder and neck pain, will get radiographs, and proceed with CT if needed soft tissue and chest CT.    Jairo BenMichelle Smith Rivers Hamrick, FNP  [facility] [phone] (phone) [phone] (fax)  [facility]

## 2020-03-19 NOTE — Patient Instructions (Addendum)
Take prednisone tablets in the morning.  Don't take Ibuprofen, aleve, or motrin, NSAIDS with the Prednisone. Can take Tylenol per package.  Take Doxycycline  As directed and with food do not lay flat for one hour after taking.   Doxycycline tablets or capsules What is this medicine? DOXYCYCLINE (dox i SYE kleen) is a tetracycline antibiotic. It kills certain bacteria or stops their growth. It is used to treat many kinds of infections, like dental, skin, respiratory, and urinary tract infections. It also treats acne, Lyme disease, malaria, and certain sexually transmitted infections. This medicine may be used for other purposes; ask your health care provider or pharmacist if you have questions. COMMON BRAND NAME(S): Acticlate, Adoxa, Adoxa CK, Adoxa Pak, Adoxa TT, Alodox, Avidoxy, Doxal, LYMEPAK, Mondoxyne NL, Monodox, Morgidox 1x, Morgidox 1x Kit, Morgidox 2x, Morgidox 2x Kit, NutriDox, Ocudox, Lisman, [place], Vibra-Tabs, Vibramycin What should I tell my health care provider before I take this medicine? They need to know if you have any of these conditions:  liver disease  long exposure to sunlight like working outdoors  stomach problems like colitis  an unusual or allergic reaction to doxycycline, tetracycline antibiotics, other medicines, foods, dyes, or preservatives  pregnant or trying to get pregnant  breast-feeding How should I use this medicine? Take this medicine by mouth with a full glass of water. Follow the directions on the prescription label. It is best to take this medicine without food, but if it upsets your stomach take it with food. Take your medicine at regular intervals. Do not take your medicine more often than directed. Take all of your medicine as directed even if you think you are better. Do not skip doses or stop your medicine early. Talk to your pediatrician regarding the use of this medicine in children. While this drug may be prescribed for selected conditions,  precautions do apply. Overdosage: If you think you have taken too much of this medicine contact a poison control center or emergency room at once. NOTE: This medicine is only for you. Do not share this medicine with others. What if I miss a dose? If you miss a dose, take it as soon as you can. If it is almost time for your next dose, take only that dose. Do not take double or extra doses. What may interact with this medicine?  antacids  barbiturates  birth control pills  bismuth subsalicylate  carbamazepine  methoxyflurane  other antibiotics  phenytoin  vitamins that contain iron  warfarin This list may not describe all possible interactions. Give your health care provider a list of all the medicines, herbs, non-prescription drugs, or dietary supplements you use. Also tell them if you smoke, drink alcohol, or use illegal drugs. Some items may interact with your medicine. What should I watch for while using this medicine? Tell your doctor or health care professional if your symptoms do not improve. Do not treat diarrhea with over the counter products. Contact your doctor if you have diarrhea that lasts more than 2 days or if it is severe and watery. Do not take this medicine just before going to bed. It may not dissolve properly when you lay down and can cause pain in your throat. Drink plenty of fluids while taking this medicine to also help reduce irritation in your throat. This medicine can make you more sensitive to the sun. Keep out of the sun. If you cannot avoid being in the sun, wear protective clothing and use sunscreen. Do not use sun lamps  or tanning beds/booths. Birth control pills may not work properly while you are taking this medicine. Talk to your doctor about using an extra method of birth control. If you are being treated for a sexually transmitted infection, avoid sexual contact until you have finished your treatment. Your sexual partner may also need  treatment. Avoid antacids, aluminum, calcium, magnesium, and iron products for 4 hours before and 2 hours after taking a dose of this medicine. If you are using this medicine to prevent malaria, you should still protect yourself from contact with mosquitos. Stay in screened-in areas, use mosquito nets, keep your body covered, and use an insect repellent. What side effects may I notice from receiving this medicine? Side effects that you should report to your doctor or health care professional as soon as possible:  allergic reactions like skin rash, itching or hives, swelling of the face, lips, or tongue  difficulty breathing  fever  itching in the rectal or genital area  pain on swallowing  rash, fever, and swollen lymph nodes  redness, blistering, peeling or loosening of the skin, including inside the mouth  severe stomach pain or cramps  unusual bleeding or bruising  unusually weak or tired  yellowing of the eyes or skin Side effects that usually do not require medical attention (report to your doctor or health care professional if they continue or are bothersome):  diarrhea  loss of appetite  nausea, vomiting This list may not describe all possible side effects. Call your doctor for medical advice about side effects. You may report side effects to FDA at 1-800-FDA-1088. Where should I keep my medicine? Keep out of the reach of children. Store at room temperature, below 30 degrees C (86 degrees F). Protect from light. Keep container tightly closed. Throw away any unused medicine after the expiration date. Taking this medicine after the expiration date can make you seriously ill. NOTE: This sheet is a summary. It may not cover all possible information. If you have questions about this medicine, talk to your doctor, pharmacist, or health care provider.  2020 Elsevier/Gold Standard (2018-08-01 13:44:53) Prednisone tablets What is this medicine? PREDNISONE (PRED ni sone) is a  corticosteroid. It is commonly used to treat inflammation of the skin, joints, lungs, and other organs. Common conditions treated include asthma, allergies, and arthritis. It is also used for other conditions, such as blood disorders and diseases of the adrenal glands. This medicine may be used for other purposes; ask your health care provider or pharmacist if you have questions. COMMON BRAND NAME(S): Deltasone, Predone, Sterapred, Sterapred DS What should I tell my health care provider before I take this medicine? They need to know if you have any of these conditions:  Cushing's syndrome  diabetes  glaucoma  heart disease  high blood pressure  infection (especially a virus infection such as chickenpox, cold sores, or herpes)  kidney disease  liver disease  mental illness  myasthenia gravis  osteoporosis  seizures  stomach or intestine problems  thyroid disease  an unusual or allergic reaction to lactose, prednisone, other medicines, foods, dyes, or preservatives  pregnant or trying to get pregnant  breast-feeding How should I use this medicine? Take this medicine by mouth with a glass of water. Follow the directions on the prescription label. Take this medicine with food. If you are taking this medicine once a day, take it in the morning. Do not take more medicine than you are told to take. Do not suddenly stop taking your medicine  because you may develop a severe reaction. Your doctor will tell you how much medicine to take. If your doctor wants you to stop the medicine, the dose may be slowly lowered over time to avoid any side effects. Talk to your pediatrician regarding the use of this medicine in children. Special care may be needed. Overdosage: If you think you have taken too much of this medicine contact a poison control center or emergency room at once. NOTE: This medicine is only for you. Do not share this medicine with others. What if I miss a dose? If you miss  a dose, take it as soon as you can. If it is almost time for your next dose, talk to your doctor or health care professional. You may need to miss a dose or take an extra dose. Do not take double or extra doses without advice. What may interact with this medicine? Do not take this medicine with any of the following medications:  metyrapone  mifepristone This medicine may also interact with the following medications:  aminoglutethimide  amphotericin B  aspirin and aspirin-like medicines  barbiturates  certain medicines for diabetes, like glipizide or glyburide  cholestyramine  cholinesterase inhibitors  cyclosporine  digoxin  diuretics  ephedrine  male hormones, like estrogens and birth control pills  isoniazid  ketoconazole  NSAIDS, medicines for pain and inflammation, like ibuprofen or naproxen  phenytoin  rifampin  toxoids  vaccines  warfarin This list may not describe all possible interactions. Give your health care provider a list of all the medicines, herbs, non-prescription drugs, or dietary supplements you use. Also tell them if you smoke, drink alcohol, or use illegal drugs. Some items may interact with your medicine. What should I watch for while using this medicine? Visit your doctor or health care professional for regular checks on your progress. If you are taking this medicine over a prolonged period, carry an identification card with your name and address, the type and dose of your medicine, and your doctor's name and address. This medicine may increase your risk of getting an infection. Tell your doctor or health care professional if you are around anyone with measles or chickenpox, or if you develop sores or blisters that do not heal properly. If you are going to have surgery, tell your doctor or health care professional that you have taken this medicine within the last twelve months. Ask your doctor or health care professional about your diet.  You may need to lower the amount of salt you eat. This medicine may increase blood sugar. Ask your healthcare provider if changes in diet or medicines are needed if you have diabetes. What side effects may I notice from receiving this medicine? Side effects that you should report to your doctor or health care professional as soon as possible:  allergic reactions like skin rash, itching or hives, swelling of the face, lips, or tongue  changes in emotions or moods  changes in vision  depressed mood  eye pain  fever or chills, cough, sore throat, pain or difficulty passing urine  signs and symptoms of high blood sugar such as being more thirsty or hungry or having to urinate more than normal. You may also feel very tired or have blurry vision.  swelling of ankles, feet Side effects that usually do not require medical attention (report to your doctor or health care professional if they continue or are bothersome):  confusion, excitement, restlessness  headache  nausea, vomiting  skin problems, acne, thin and  shiny skin  trouble sleeping  weight gain This list may not describe all possible side effects. Call your doctor for medical advice about side effects. You may report side effects to FDA at 1-800-FDA-1088. Where should I keep my medicine? Keep out of the reach of children. Store at room temperature between 15 and 30 degrees C (59 and 86 degrees F). Protect from light. Keep container tightly closed. Throw away any unused medicine after the expiration date. NOTE: This sheet is a summary. It may not cover all possible information. If you have questions about this medicine, talk to your doctor, pharmacist, or health care provider.  2020 Elsevier/Gold Standard (2018-01-29 10:54:22) Costochondritis Costochondritis is swelling and irritation (inflammation) of the tissue (cartilage) that connects your ribs to your breastbone (sternum). This causes pain in the front of your chest.  Usually, the pain:  Starts gradually.  Is in more than one rib. This condition usually goes away on its own over time. Follow these instructions at home:  Do not do anything that makes your pain worse.  If directed, put ice on the painful area: ? Put ice in a plastic bag. ? Place a towel between your skin and the bag. ? Leave the ice on for 20 minutes, 2-3 times a day.  If directed, put heat on the affected area as often as told by your doctor. Use the heat source that your doctor tells you to use, such as a moist heat pack or a heating pad. ? Place a towel between your skin and the heat source. ? Leave the heat on for 20-30 minutes. ? Take off the heat if your skin turns bright red. This is very important if you cannot feel pain, heat, or cold. You may have a greater risk of getting burned.  Take over-the-counter and prescription medicines only as told by your doctor.  Return to your normal activities as told by your doctor. Ask your doctor what activities are safe for you.  Keep all follow-up visits as told by your doctor. This is important. Contact a doctor if:  You have chills or a fever.  Your pain does not go away or it gets worse.  You have a cough that does not go away. Get help right away if:  You are short of breath. This information is not intended to replace advice given to you by your health care provider. Make sure you discuss any questions you have with your health care provider. Document Revised: 05/16/2017 Document Reviewed: 08/25/2015 Elsevier Patient Education  [place]. Shoulder Pain Many things can cause shoulder pain, including:  An injury.  Moving the shoulder in the same way again and again (overuse).  Joint pain (arthritis). Pain can come from:  Swelling and irritation (inflammation) of any part of the shoulder.  An injury to the shoulder joint.  An injury to: ? Tissues that connect muscle to bone (tendons). ? Tissues that  connect bones to each other (ligaments). ? Bones. Follow these instructions at home: Watch for changes in your symptoms. Let your doctor know about them. Follow these instructions to help with your pain. If you have a sling:  Wear the sling as told by your doctor. Remove it only as told by your doctor.  Loosen the sling if your fingers: ? Tingle. ? Become numb. ? Turn cold and blue.  Keep the sling clean.  If the sling is not waterproof: ? Do not let it get wet. ? Take the sling off when  you shower or bathe. Managing pain, stiffness, and swelling   If told, put ice on the painful area: ? Put ice in a plastic bag. ? Place a towel between your skin and the bag. ? Leave the ice on for 20 minutes, 2-3 times a day. Stop putting ice on if it does not help with the pain.  Squeeze a soft ball or a foam pad as much as possible. This prevents swelling in the shoulder. It also helps to strengthen the arm. General instructions  Take over-the-counter and prescription medicines only as told by your doctor.  Keep all follow-up visits as told by your doctor. This is important. Contact a doctor if:  Your pain gets worse.  Medicine does not help your pain.  You have new pain in your arm, hand, or fingers. Get help right away if:  Your arm, hand, or fingers: ? Tingle. ? Are numb. ? Are swollen. ? Are painful. ? Turn white or blue. Summary  Shoulder pain can be caused by many things. These include injury, moving the shoulder in the same away again and again, and joint pain.  Watch for changes in your symptoms. Let your doctor know about them.  This condition may be treated with a sling, ice, and pain medicine.  Contact your doctor if the pain gets worse or you have new pain. Get help right away if your arm, hand, or fingers tingle or get numb, swollen, or painful.  Keep all follow-up visits as told by your doctor. This is important. This information is not intended to replace  advice given to you by your health care provider. Make sure you discuss any questions you have with your health care provider. Document Revised: 11/13/2017 Document Reviewed: 11/13/2017 Elsevier Patient Education  [place].

## 2020-03-19 NOTE — Progress Notes (Signed)
Sent for culture and microscopic.

## 2020-03-20 LAB — URINALYSIS, MICROSCOPIC ONLY
Bacteria, UA: NONE SEEN
Casts: NONE SEEN /lpf
Epithelial Cells (non renal): NONE SEEN /hpf (ref 0–10)
RBC, Urine: NONE SEEN /hpf (ref 0–2)
WBC, UA: NONE SEEN /hpf (ref 0–5)

## 2020-03-22 ENCOUNTER — Telehealth: Payer: Self-pay | Admitting: Adult Health

## 2020-03-22 ENCOUNTER — Encounter: Payer: Self-pay | Admitting: Adult Health

## 2020-03-22 ENCOUNTER — Other Ambulatory Visit: Payer: Self-pay | Admitting: Adult Health

## 2020-03-22 DIAGNOSIS — R221 Localized swelling, mass and lump, neck: Secondary | ICD-10-CM

## 2020-03-22 DIAGNOSIS — R0781 Pleurodynia: Secondary | ICD-10-CM

## 2020-03-22 DIAGNOSIS — M542 Cervicalgia: Secondary | ICD-10-CM

## 2020-03-22 LAB — URINE CULTURE: Organism ID, Bacteria: NO GROWTH

## 2020-03-22 NOTE — Telephone Encounter (Signed)
Patient is calling to find out the results of POCT urinalysis dipstick and x-rays . Please advise. [phone]

## 2020-03-22 NOTE — Progress Notes (Signed)
Urine culture shows no infection and microscopic shows no blood, will do a repeat micro urine when he returns to office for scheduled follow up.

## 2020-03-22 NOTE — Progress Notes (Signed)
All radiographs are negative, I resulted urine earlier. I have ordered a CT of his neck soft tissues to rule out mass on right neck/ chest.  Referrals should call him soon. Call us if he has not heard within 1 week. Recommend getting ordered fasting labs completed as soon as possible.

## 2020-03-23 DIAGNOSIS — E559 Vitamin D deficiency, unspecified: Secondary | ICD-10-CM | POA: Diagnosis not present

## 2020-03-23 DIAGNOSIS — I10 Essential (primary) hypertension: Secondary | ICD-10-CM | POA: Diagnosis not present

## 2020-03-23 NOTE — Telephone Encounter (Signed)
Resulted note already should be in call list.

## 2020-03-24 ENCOUNTER — Other Ambulatory Visit: Payer: Self-pay | Admitting: Adult Health

## 2020-03-24 ENCOUNTER — Other Ambulatory Visit: Payer: Self-pay

## 2020-03-24 ENCOUNTER — Telehealth: Payer: Self-pay

## 2020-03-24 DIAGNOSIS — E559 Vitamin D deficiency, unspecified: Secondary | ICD-10-CM

## 2020-03-24 DIAGNOSIS — D72820 Lymphocytosis (symptomatic): Secondary | ICD-10-CM

## 2020-03-24 LAB — COMPREHENSIVE METABOLIC PANEL
ALT: 41 IU/L (ref 0–44)
AST: 23 IU/L (ref 0–40)
Albumin/Globulin Ratio: 1.4 (ref 1.2–2.2)
Albumin: 4.9 g/dL (ref 4.0–5.0)
Alkaline Phosphatase: 106 IU/L (ref 44–121)
BUN/Creatinine Ratio: 17 (ref 9–20)
BUN: 19 mg/dL (ref 6–20)
Bilirubin Total: 0.3 mg/dL (ref 0.0–1.2)
CO2: 27 mmol/L (ref 20–29)
Calcium: 10.3 mg/dL — ABNORMAL HIGH (ref 8.7–10.2)
Chloride: 95 mmol/L — ABNORMAL LOW (ref 96–106)
Creatinine, Ser: 1.13 mg/dL (ref 0.76–1.27)
GFR calc Af Amer: 97 mL/min/{1.73_m2} (ref >59)
GFR calc non Af Amer: 84 mL/min/{1.73_m2} (ref >59)
Globulin, Total: 3.5 g/dL (ref 1.5–4.5)
Glucose: 86 mg/dL (ref 65–99)
Potassium: 3.6 mmol/L (ref 3.5–5.2)
Sodium: 139 mmol/L (ref 134–144)
Total Protein: 8.4 g/dL (ref 6.0–8.5)

## 2020-03-24 LAB — LIPID PANEL
Chol/HDL Ratio: 6.7 ratio — ABNORMAL HIGH (ref 0.0–5.0)
Cholesterol, Total: 313 mg/dL — ABNORMAL HIGH (ref 100–199)
HDL: 47 mg/dL (ref >39)
LDL Chol Calc (NIH): 250 mg/dL — ABNORMAL HIGH (ref 0–99)
Triglycerides: 94 mg/dL (ref 0–149)
VLDL Cholesterol Cal: 16 mg/dL (ref 5–40)

## 2020-03-24 LAB — TSH: TSH: 1.08 u[IU]/mL (ref 0.450–4.500)

## 2020-03-24 LAB — CBC WITH DIFFERENTIAL/PLATELET
Basophils Absolute: 0.1 10*3/uL (ref 0.0–0.2)
Basos: 0 %
EOS (ABSOLUTE): 0.1 10*3/uL (ref 0.0–0.4)
Eos: 0 %
Hematocrit: 47 % (ref 37.5–51.0)
Hemoglobin: 15.5 g/dL (ref 13.0–17.7)
Immature Grans (Abs): 0 10*3/uL (ref 0.0–0.1)
Immature Granulocytes: 0 %
Lymphocytes Absolute: 4.6 10*3/uL — ABNORMAL HIGH (ref 0.7–3.1)
Lymphs: 29 %
MCH: 28.3 pg (ref 26.6–33.0)
MCHC: 33 g/dL (ref 31.5–35.7)
MCV: 86 fL (ref 79–97)
Monocytes Absolute: 1.5 10*3/uL — ABNORMAL HIGH (ref 0.1–0.9)
Monocytes: 9 %
Neutrophils Absolute: 9.6 10*3/uL — ABNORMAL HIGH (ref 1.4–7.0)
Neutrophils: 62 %
Platelets: 527 10*3/uL — ABNORMAL HIGH (ref 150–450)
RBC: 5.48 x10E6/uL (ref 4.14–5.80)
RDW: 13.6 % (ref 11.6–15.4)
WBC: 15.8 10*3/uL — ABNORMAL HIGH (ref 3.4–10.8)

## 2020-03-24 LAB — VITAMIN D 25 HYDROXY (VIT D DEFICIENCY, FRACTURES): Vit D, 25-Hydroxy: 17.8 ng/mL — ABNORMAL LOW (ref 30.0–100.0)

## 2020-03-24 MED ORDER — AMOXICILLIN-POT CLAVULANATE 875-125 MG PO TABS: 1.0000 | ORAL_TABLET | Freq: Two times a day (BID) | ORAL | 0 refills | Status: DC

## 2020-03-24 MED ORDER — VITAMIN D (ERGOCALCIFEROL) 1.25 MG (50000 UNIT) PO CAPS: 50000.0000 [IU] | ORAL_CAPSULE | ORAL | 0 refills | Status: DC

## 2020-03-24 NOTE — Progress Notes (Unsigned)
Meds ordered this encounter  Medications  . amoxicillin-clavulanate (AUGMENTIN) 875-125 MG tablet    Sig: Take 1 tablet by mouth 2 (two) times daily.    Dispense:  20 tablet    Refill:  0  . Vitamin D, Ergocalciferol, (DRISDOL) 1.25 MG (50000 UNIT) CAPS capsule    Sig: Take 1 capsule (50,000 Units total) by mouth every 7 (seven) days. (taking one tablet per week) labs 1- 2 weeks after completing medication.    Dispense:  12 capsule    Refill:  0   Repeat in after completing antibiotics. 1-2 weeks.  Orders Placed This Encounter  Procedures  . CBC with Differential/Platelet    Standing Status:   Future    Standing Expiration Date:   04/23/2020  . Comprehensive Metabolic Panel (CMET)    Standing Status:   Future    Standing Expiration Date:   04/23/2020   Vitamin d recheck 1- 2 weeks after finishing script Vitamin D advised.

## 2020-03-24 NOTE — Telephone Encounter (Signed)
Patient requesting medications to be called into [facility] on [address] [place].

## 2020-03-24 NOTE — Progress Notes (Signed)
CBC shows likely bacterial infection with neutrophils, lymphocytes and white blood cells elevated.   I am going to send in Augmentin to [facility] on [address]  and want him to take it as directed and finish his doxycycline given in office. Separate by 2 hours.  CMP ok.   Verify if he was fasting when he went for labs ?  Total cholesterol and LDL elevated.  Discuss lifestyle modification with patient e.g. increase exercise, fiber, fruits, vegetables, lean meat, and omega 3/fish intake and decrease saturated fat.  If patient following strict diet and exercise program already please schedule follow up appointment with primary care physician  TSH for thyroid within normal limits.   Vitamin D is low, will send Vitamin D prescription to take 50,000 international units by mouth once every 7 days ( once weekly) for 12 weeks. Repeat lab Vitamin D the following 1- 2 weeks after completing 12 week course.   I have placed a repeat order for labs CBC and CMP in system, ok to walk into lab fasting nothing to eat or drink for 8 hours except water or black coffee.    If he was not fasting at labs can add on a lipid panel for recheck. If he was fasting aggressive diet and exercise/ lifestyle changes and repeat in 6- 8 weeks.  Labs for later : Vitamin D when completes as above.

## 2020-03-24 NOTE — Progress Notes (Signed)
Yes would like to see him back in 2 weeks, even if he is improved. Sent message to Mychart as well.

## 2020-03-24 NOTE — Telephone Encounter (Signed)
Pt needs amoxicillin-clavulanate (AUGMENTIN) 875-125 MG tablet  And Vitamin D, Ergocalciferol, (DRISDOL) 1.25 MG (50000 UNIT) CAPS capsule  Routed to correct pharmacy listed below  [facility] [address], [place] - [address]  [address] [place] [address]  Phone: [phone] Fax: [phone]

## 2020-03-24 NOTE — Telephone Encounter (Signed)
Copied from CRM [id number]. Topic: General - Call Back - No Documentation >> Mar 24, 2020 10:37 AM Randol Kern wrote: Reason for CRM: [phone] pt would like to speak clinic about his recent lab work See result note,. KW

## 2020-03-25 ENCOUNTER — Telehealth: Payer: Self-pay

## 2020-03-25 NOTE — Telephone Encounter (Signed)
Copied from CRM [id number]. Topic: General - Other >> Mar 25, 2020 11:06 AM Jaquita Rector A wrote: Reason for CRM: Patient called to inquire if Nathan Nelson can give him a note to excuse him from work for today because he stated that his BP was elevated and he did not feel well 151/102 would like a call back please Ph# [phone]

## 2020-03-26 NOTE — Telephone Encounter (Signed)
Unable to reach patient voicemail box is not set up, if patient returns call okay to advise of message below. KW

## 2020-03-26 NOTE — Telephone Encounter (Signed)
He has a follow up 11/19, I am ok with providing a note for today for work, am however concerned with blood pressure and it is Friday near office closing, advise him if he is taking his blood pressure medication as prescribed and continues to get elevated reading and feel unwell or new symptoms over the weekend he should be seen in medical facility immediately.

## 2020-03-26 NOTE — Telephone Encounter (Signed)
Patient last seen in office 03/19/20 please advise if appropriate to excuse patient from work yesterday? KW

## 2020-03-28 ENCOUNTER — Other Ambulatory Visit: Payer: Self-pay

## 2020-03-28 ENCOUNTER — Encounter: Payer: Self-pay | Admitting: Emergency Medicine

## 2020-03-28 ENCOUNTER — Emergency Department
Admission: EM | Admit: 2020-03-28 | Discharge: 2020-03-28 | Disposition: A | Discharge status: Home / Self Care | Payer: BC Managed Care – PPO | Attending: Emergency Medicine | Admitting: Emergency Medicine

## 2020-03-28 DIAGNOSIS — Z79899 Other long term (current) drug therapy: Secondary | ICD-10-CM | POA: Insufficient documentation

## 2020-03-28 DIAGNOSIS — I10 Essential (primary) hypertension: Secondary | ICD-10-CM

## 2020-03-28 DIAGNOSIS — Z87891 Personal history of nicotine dependence: Secondary | ICD-10-CM | POA: Insufficient documentation

## 2020-03-28 NOTE — ED Triage Notes (Signed)
Pt to ED via POV stating that he has been feeling sick since he started taking amoxicillin and prednisone. Pt states that he was started on the meds because his blood work showed increased WBC and that his body was trying to fight off an infection.

## 2020-03-28 NOTE — Discharge Instructions (Addendum)
Your exam is normal at this time.  Your symptoms may represent a side effect of the recent steroid use completed.  Continue to monitor symptoms and follow-up department provider.  Return to the ED if needed.

## 2020-03-28 NOTE — ED Provider Notes (Signed)
[facility] Emergency Department Provider Note ____________________________________________  Time seen: 1052  I have reviewed the triage vital signs and the nursing notes.  HISTORY  Chief Complaint  Medication Reaction  HPI Nathan Nelson is a 34 y.o. male presents himself to the ED for evaluation of elevated blood pressure  and feeling unwell since he has been taken amoxicillin and prednisone.  Patient describes he was evaluated by his PCP for lymphadenopathy to the cervical/suprascapular region.  He was subsequently started on amoxicillin, doxycycline, and prednisone for his lymphadenopathy and leukocytosis.  He was found to have an elevated WBCs at 15.8.  He presents today reporting improvement of his symptoms overall.  He describes decreased swelling and tenderness to the suprascapular region.  He admits that he did not dose the doxycycline as prescribed, assuming it was the cause for his elevated blood pressure as well as some headaches.  He tolerated prednisone and amoxicillin without difficulty.  He presents today for evaluation of his complaints.  He denies any ongoing chest pain, shortness of breath, weight loss.  Past Medical History:  Diagnosis Date  . Anxiety   . Hypertension     Patient Active Problem List   Diagnosis Date Noted  . Lymphocytosis 03/24/2020  . Screening for blood or protein in urine 03/19/2020  . Rib pain on left side 03/19/2020  . Vitamin D deficiency 03/19/2020  . Cervical pain 03/19/2020  . Anxiety 12/06/2015  . Right shoulder injury 09/28/2015  . Costochondritis 06/11/2014  . URI (upper respiratory infection) 05/30/2012  . Hypertension     Past Surgical History:  Procedure Laterality Date  . KNEE SURGERY      Prior to Admission medications   Medication Sig Start Date End Date Taking? Authorizing Provider  amLODipine (NORVASC) 10 MG tablet Take 10 mg by mouth daily. 02/18/20   [provider]   amoxicillin-clavulanate (AUGMENTIN) 875-125 MG tablet Take 1 tablet by mouth 2 (two) times daily. 03/24/20   Flinchum, Eula Fried, FNP  chlorthalidone (HYGROTON) 50 MG tablet Take by mouth. 05/23/18 05/23/19  [provider]  doxycycline (VIBRA-TABS) 100 MG tablet Take 1 tablet (100 mg total) by mouth 2 (two) times daily. 03/19/20   Flinchum, Eula Fried, FNP  FLUoxetine (PROZAC) 20 MG capsule Take 60 mg by mouth every morning. 12/17/19   [provider]  potassium chloride SA (KLOR-CON) 20 MEQ tablet Take 20 mEq by mouth 2 (two) times daily. 11/20/19   [provider]  predniSONE (STERAPRED UNI-PAK 21 TAB) 10 MG (21) TBPK tablet PO: Take 6 tablets on day 1:Take 5 tablets day 2:Take 4 tablets day 3: Take 3 tablets day 4:Take 2 tablets day five: 5 Take 1 tablet day 6 03/19/20   Flinchum, Eula Fried, FNP  Vitamin D, Ergocalciferol, (DRISDOL) 1.25 MG (50000 UNIT) CAPS capsule Take 1 capsule (50,000 Units total) by mouth every 7 (seven) days. (taking one tablet per week) labs 1- 2 weeks after completing medication. 03/24/20   Flinchum, Eula Fried, FNP    Allergies Lisinopril  Family History  Problem Relation Age of Onset  . Hypertension Mother   . Diabetes Father   . Hypertension Father     Social History Social History   Tobacco Use  . Smoking status: Former Smoker    Quit date: 03/02/2014    Years since quitting: 6.0  . Smokeless tobacco: Never Used  Substance Use Topics  . Alcohol use: Yes    Alcohol/week: 0.0 standard drinks  Comment: rarely  . Drug use: No    Review of Systems  Constitutional: Negative for fever. Eyes: Negative for visual changes. ENT: Negative for sore throat. Cardiovascular: Negative for chest pain. Respiratory: Negative for shortness of breath. Gastrointestinal: Negative for abdominal pain, vomiting and diarrhea. Genitourinary: Negative for dysuria. Musculoskeletal: Negative for back pain. Skin: Negative for rash. Neurological:  Negative for headaches, focal weakness or numbness. ____________________________________________  PHYSICAL EXAM:  VITAL SIGNS: ED Triage Vitals  Enc Vitals Group     BP 03/28/20 1025 (!) 159/106     Pulse Rate 03/28/20 1025 84     Resp 03/28/20 1025 16     Temp 03/28/20 1025 98.9 F (37.2 C)     Temp Source 03/28/20 1025 Oral     SpO2 03/28/20 1025 99 %     Weight 03/28/20 1020 218 lb (98.9 kg)     Height 03/28/20 1020 5\' 4"  (1.626 m)     Head Circumference --      Peak Flow --      Pain Score 03/28/20 1026 0     Pain Loc --      Pain Edu? --      Excl. in GC? --     Constitutional: Alert and oriented. Well appearing and in no distress. Head: Normocephalic and atraumatic. Eyes: Conjunctivae are normal. PERRL. Normal extraocular movements and fundi bilaterally. Ears: Canals clear. TMs intact bilaterally. Nose: No congestion/rhinorrhea/epistaxis. Mouth/Throat: Mucous membranes are moist. Neck: Supple. No thyromegaly. Hematological/Lymphatic/Immunological: No cervical or supraclavicular lymphadenopathy. Cardiovascular: Normal rate, regular rhythm. Normal distal pulses. Respiratory: Normal respiratory effort. No wheezes/rales/rhonchi. Gastrointestinal: Soft and nontender. No distention.  Normal bowel sounds noted. Musculoskeletal: Nontender with normal range of motion in all extremities.  Neurologic:  Normal gait without ataxia. Normal speech and language. No gross focal neurologic deficits are appreciated. Skin:  Skin is warm, dry and intact. No rash noted. Psychiatric: Mood and affect are normal. Patient exhibits appropriate insight and judgment. ____________________________________________  PROCEDURES  Procedures ____________________________________________  INITIAL IMPRESSION / ASSESSMENT AND PLAN / ED COURSE  Patient presents for evaluation of feeling unwell, reporting elevated blood pressure.  He is otherwise stable on exam without signs of acute hypertensive  emergency or acute coronary syndrome.  Patient is likely experiencing months of going side effects of his recent steroid taper.  He will follow-up with his primary provider or return to the ED if needed.  A work note is provided as requested.  Nathan Nelson was evaluated in Emergency Department on 03/28/2020 for the symptoms described in the history of present illness. He was evaluated in the context of the global COVID-19 pandemic, which necessitated consideration that the patient might be at risk for infection with the SARS-CoV-2 virus that causes COVID-19. Institutional protocols and algorithms that pertain to the evaluation of patients at risk for COVID-19 are in a state of rapid change based on information released by regulatory bodies including the CDC and federal and state organizations. These policies and algorithms were followed during the patient's care in the ED. ____________________________________________  FINAL CLINICAL IMPRESSION(S) / ED DIAGNOSES  Final diagnoses:  Primary hypertension      Carry Weesner, 03/30/2020, PA-C 03/28/20 1214    03/30/20, MD 03/28/20 1443

## 2020-03-29 ENCOUNTER — Encounter: Payer: Self-pay | Admitting: Adult Health

## 2020-03-29 NOTE — Telephone Encounter (Signed)
Spoke with patient on the phone who states that he was seen in ED over weekend. He is requesting a note to excuse him out of work from Thursday till today, he is asking that this be left in [place] for him. KW

## 2020-03-29 NOTE — Telephone Encounter (Signed)
Sent in mychart

## 2020-03-30 ENCOUNTER — Telehealth: Payer: Self-pay

## 2020-03-30 NOTE — Telephone Encounter (Signed)
I don't see where anyone called him.    Copied from CRM [id number]. Topic: General - Call Back - No Documentation >> Mar 30, 2020  9:41 AM Lyn Hollingshead D wrote: PT returning call / no voicemail / please advise

## 2020-04-01 ENCOUNTER — Ambulatory Visit
Admission: RE | Admit: 2020-04-01 | Discharge: 2020-04-01 | Disposition: A | Discharge status: Home / Self Care | Payer: BC Managed Care – PPO | Source: Ambulatory Visit | Attending: Adult Health | Admitting: Adult Health

## 2020-04-01 ENCOUNTER — Other Ambulatory Visit: Payer: Self-pay

## 2020-04-01 DIAGNOSIS — R0781 Pleurodynia: Secondary | ICD-10-CM | POA: Diagnosis not present

## 2020-04-01 DIAGNOSIS — J3489 Other specified disorders of nose and nasal sinuses: Secondary | ICD-10-CM | POA: Diagnosis not present

## 2020-04-01 DIAGNOSIS — R221 Localized swelling, mass and lump, neck: Secondary | ICD-10-CM | POA: Diagnosis not present

## 2020-04-01 DIAGNOSIS — J351 Hypertrophy of tonsils: Secondary | ICD-10-CM | POA: Diagnosis not present

## 2020-04-01 DIAGNOSIS — M542 Cervicalgia: Secondary | ICD-10-CM | POA: Diagnosis not present

## 2020-04-01 DIAGNOSIS — R918 Other nonspecific abnormal finding of lung field: Secondary | ICD-10-CM | POA: Diagnosis not present

## 2020-04-01 DIAGNOSIS — L989 Disorder of the skin and subcutaneous tissue, unspecified: Secondary | ICD-10-CM | POA: Diagnosis not present

## 2020-04-01 MED ORDER — IOHEXOL 300 MG/ML  SOLN
75.0000 mL | Freq: Once | INTRAMUSCULAR | Status: AC | PRN
  Administered 2020-04-01: 75 mL via INTRAVENOUS

## 2020-04-02 NOTE — Progress Notes (Signed)
Has appointment on 04/06/20 for clinical follow up.  CT chest with small nodule right upper lobe thought to be benign.  1. No neck mass or lymphadenopathy identified in the area of clinical concern. 2. 2 cm region of mildly nodular skin thickening in the left face at the level of the mandibular angle. Correlate with physical examination.

## 2020-04-06 ENCOUNTER — Other Ambulatory Visit: Payer: Self-pay

## 2020-04-06 ENCOUNTER — Ambulatory Visit (INDEPENDENT_AMBULATORY_CARE_PROVIDER_SITE_OTHER): Payer: BC Managed Care – PPO | Admitting: Adult Health

## 2020-04-06 ENCOUNTER — Encounter: Payer: Self-pay | Admitting: Adult Health

## 2020-04-06 VITALS — BP 124/84 | HR 77 | Temp 98.6°F | Resp 16 | Wt 225.0 lb

## 2020-04-06 DIAGNOSIS — E785 Hyperlipidemia, unspecified: Secondary | ICD-10-CM | POA: Diagnosis not present

## 2020-04-06 DIAGNOSIS — R911 Solitary pulmonary nodule: Secondary | ICD-10-CM

## 2020-04-06 DIAGNOSIS — Z6838 Body mass index (BMI) 38.0-38.9, adult: Secondary | ICD-10-CM | POA: Diagnosis not present

## 2020-04-06 DIAGNOSIS — M62838 Other muscle spasm: Secondary | ICD-10-CM | POA: Insufficient documentation

## 2020-04-06 DIAGNOSIS — F419 Anxiety disorder, unspecified: Secondary | ICD-10-CM

## 2020-04-06 MED ORDER — BACLOFEN 10 MG PO TABS: 10.0000 mg | ORAL_TABLET | Freq: Two times a day (BID) | ORAL | 0 refills | Status: DC

## 2020-04-06 MED ORDER — HYDROXYZINE HCL 50 MG PO TABS: 50.0000 mg | ORAL_TABLET | Freq: Three times a day (TID) | ORAL | 0 refills | Status: DC | PRN

## 2020-04-06 MED ORDER — OMEPRAZOLE 20 MG PO CPDR: 20.0000 mg | DELAYED_RELEASE_CAPSULE | Freq: Every day | ORAL | 3 refills | Status: DC

## 2020-04-06 NOTE — Progress Notes (Addendum)
Established patient visit   Patient: Nathan Nelson   DOB: 05/25/1985   34 y.o. Male  MRN: [id number] Visit Date: 04/06/2020  Today's healthcare provider: Jairo Ben, FNP   Chief Complaint  Patient presents with  . Follow-up   Subjective    HPI  The patient was last seen for this 2 weeks ago.He feels better area on right upper chest neck ok on CT scan, suspect lipoma, area no longer tender. He has been working out. Denies any right shoulder pain now and feels well.  Denies shortness of breath. Rib x rays were within normal limits.  Changes made at last visit include check labs and start Augmentin and Doxycyline as well as Prednisone. He reports his symptoms resolved and area shrunk on chest.   He reports excellent compliance with treatment. He feels that condition is Improved. He is not having side effects.    Started on Vitamin D prescription for insufficiency.   Hyperlipidemia- needs statin. - he had egg white omlet nothing else to eat.   History of smoking cigarettes, he quit 2 years ago, He smoked 4- 5 years. He 1/2 ppd. Punctate 3 mm nodule incidently found on CT chest. Given his history of rib pain will have pulmonary review.   Left side of face skin has keloid on it, declines dermatology referral. He will let provider know. Coordinates with that seen on head and neck CT.   Patient  denies any fever, body aches,chills, rash, chest pain, shortness of breath, nausea, vomiting, or diarrhea.    -----------------------------------------------------------------------------------------    Patient Active Problem List   Diagnosis Date Noted  . Hyperlipidemia 04/06/2020  . Body mass index (BMI) of 38.0-38.9 in adult 04/06/2020  . Muscle spasm 04/06/2020  . Lymphocytosis 03/24/2020  . Screening for blood or protein in urine 03/19/2020  . Rib pain on left side 03/19/2020  . Vitamin D deficiency 03/19/2020  . Cervical pain 03/19/2020  . Anxiety 12/06/2015   . Right shoulder injury 09/28/2015  . Costochondritis 06/11/2014  . URI (upper respiratory infection) 05/30/2012  . Hypertension    Social History   Tobacco Use  . Smoking status: Former Smoker    Quit date: 03/02/2014    Years since quitting: 6.1  . Smokeless tobacco: Never Used  Substance Use Topics  . Alcohol use: Yes    Alcohol/week: 0.0 standard drinks    Comment: rarely  . Drug use: No   Allergies  Allergen Reactions  . Lisinopril Other (See Comments)    Tightness in chest       Medications: Outpatient Medications Prior to Visit  Medication Sig  . amLODipine (NORVASC) 10 MG tablet Take 10 mg by mouth daily.   . chlorthalidone (HYGROTON) 50 MG tablet Take by mouth.  Marland Kitchen FLUoxetine (PROZAC) 20 MG capsule Take 60 mg by mouth every morning.  . potassium chloride SA (KLOR-CON) 20 MEQ tablet Take 20 mEq by mouth 2 (two) times daily.  . Vitamin D, Ergocalciferol, (DRISDOL) 1.25 MG (50000 UNIT) CAPS capsule Take 1 capsule (50,000 Units total) by mouth every 7 (seven) days. (taking one tablet per week) labs 1- 2 weeks after completing medication.  . [DISCONTINUED] amoxicillin-clavulanate (AUGMENTIN) 875-125 MG tablet Take 1 tablet by mouth 2 (two) times daily. (Patient not taking: Reported on 04/06/2020)  . [DISCONTINUED] doxycycline (VIBRA-TABS) 100 MG tablet Take 1 tablet (100 mg total) by mouth 2 (two) times daily. (Patient not taking: Reported on 04/06/2020)  . [DISCONTINUED] predniSONE (STERAPRED UNI-PAK  21 TAB) 10 MG (21) TBPK tablet PO: Take 6 tablets on day 1:Take 5 tablets day 2:Take 4 tablets day 3: Take 3 tablets day 4:Take 2 tablets day five: 5 Take 1 tablet day 6 (Patient not taking: Reported on 04/06/2020)   No facility-administered medications prior to visit.    Review of Systems  Constitutional: Negative.  Negative for appetite change, chills and fatigue.  Respiratory: Negative.   Cardiovascular: Negative.     Last CBC Lab Results  Component Value Date    WBC 15.8 (H) 03/23/2020   HGB 15.5 03/23/2020   HCT 47.0 03/23/2020   MCV 86 03/23/2020   MCH 28.3 03/23/2020   RDW 13.6 03/23/2020   PLT 527 (H) 03/23/2020   Last metabolic panel Lab Results  Component Value Date   GLUCOSE 86 03/23/2020   NA 139 03/23/2020   K 3.6 03/23/2020   CL 95 (L) 03/23/2020   CO2 27 03/23/2020   BUN 19 03/23/2020   CREATININE 1.13 03/23/2020   GFRNONAA 84 03/23/2020   GFRAA 97 03/23/2020   CALCIUM 10.3 (H) 03/23/2020   PROT 8.4 03/23/2020   ALBUMIN 4.9 03/23/2020   LABGLOB 3.5 03/23/2020   AGRATIO 1.4 03/23/2020   BILITOT 0.3 03/23/2020   ALKPHOS 106 03/23/2020   AST 23 03/23/2020   ALT 41 03/23/2020   ANIONGAP 22 (H) 03/18/2019   Last lipids Lab Results  Component Value Date   CHOL 313 (H) 03/23/2020   HDL 47 03/23/2020   LDLCALC 250 (H) 03/23/2020   TRIG 94 03/23/2020   CHOLHDL 6.7 (H) 03/23/2020      Objective    BP 124/84 (BP Location: Right Arm, Patient Position: Sitting, Cuff Size: Large)   Pulse 77   Temp 98.6 F (37 C) (Oral)   Resp 16   Wt 225 lb (102.1 kg)   SpO2 99%   BMI 38.62 kg/m  BP Readings from Last 3 Encounters:  04/06/20 124/84  03/28/20 (!) 146/86  03/19/20 (!) 133/92   Wt Readings from Last 3 Encounters:  04/06/20 225 lb (102.1 kg)  03/28/20 218 lb (98.9 kg)  03/19/20 223 lb 12.8 oz (101.5 kg)      Physical Exam Vitals reviewed.  Constitutional:      General: He is not in acute distress.    Appearance: He is not ill-appearing, toxic-appearing or diaphoretic.  HENT:     Head: Normocephalic and atraumatic.     Right Ear: External ear normal.     Left Ear: External ear normal.     Nose: No congestion or rhinorrhea.     Mouth/Throat:     Mouth: Mucous membranes are moist.  Eyes:     General: No scleral icterus.    Pupils: Pupils are equal, round, and reactive to light.  Cardiovascular:     Rate and Rhythm: Normal rate.     Pulses: Normal pulses.     Heart sounds: No murmur heard.  No friction  rub. No gallop.   Pulmonary:     Effort: Pulmonary effort is normal. No respiratory distress.     Breath sounds: No stridor. No wheezing, rhonchi or rales.  Chest:     Chest wall: No tenderness.  Abdominal:     General: There is no distension.     Palpations: Abdomen is soft.     Tenderness: There is no abdominal tenderness.  Musculoskeletal:        General: No swelling, tenderness, deformity or signs of injury. Normal range of  motion.     Cervical back: Normal range of motion. No rigidity.     Right lower leg: No edema.     Left lower leg: No edema.     Comments: Mild sternal tenderness, has been working out doing push ups and hard work our started after this. Denies injutry.  Lymphadenopathy:     Cervical: No cervical adenopathy.  Skin:    General: Skin is warm.     Capillary Refill: Capillary refill takes less than 2 seconds.       Neurological:     General: No focal deficit present.     Mental Status: He is alert.  Psychiatric:        Mood and Affect: Mood normal.        Behavior: Behavior normal.        Thought Content: Thought content normal.        Judgment: Judgment normal.      No results found for any visits on 04/06/20.  Assessment & Plan     Body mass index (BMI) of 38.0-38.9 in adult - Plan: CBC with Differential/Platelet, Comprehensive Metabolic Panel (CMET)  Hyperlipidemia, unspecified hyperlipidemia type - Plan: CBC with Differential/Platelet, Comprehensive Metabolic Panel (CMET), Lipid Panel w/o Chol/HDL Ratio  Anxiety - Plan: hydrOXYzine (ATARAX/VISTARIL) 50 MG tablet  Muscle spasm  Pulmonary nodule, right - Plan: Ambulatory referral to Pulmonology  He was not fasting last cholesterol check will recheck today and CBC for WBC.   Orders Placed This Encounter  Procedures  . CBC with Differential/Platelet  . Comprehensive Metabolic Panel (CMET)  . Lipid Panel w/o Chol/HDL Ratio  . Ambulatory referral to Pulmonology    Referral Priority:   Routine     Referral Type:   Consultation    Referral Reason:   Specialty Services Required    Referred to Provider:   Salena Saner, MD    Requested Specialty:   Pulmonary Disease    Number of Visits Requested:   1   Orders Placed This Encounter  Procedures  . CBC with Differential/Platelet  . Comprehensive Metabolic Panel (CMET)  . Lipid Panel w/o Chol/HDL Ratio  . Ambulatory referral to Pulmonology    The patient is advised to begin progressive daily aerobic exercise program, follow a low fat, low cholesterol diet, attempt to lose weight, reduce salt in diet and cooking, improve dietary compliance, continue current medications, continue current healthy lifestyle patterns and return for routine annual checkups. Red Flags discussed. The patient was given clear instructions to go to ER or return to medical center if any red flags develop, symptoms do not improve, worsen or new problems develop. They verbalized understanding.  Return in about 3 months (around 07/07/2020), or if symptoms worsen or fail to improve, for at any time for any worsening symptoms, chronic disease f/u.         Jairo Ben, FNP  [facility] [phone] (phone) [phone] (fax)  [facility]

## 2020-04-06 NOTE — Patient Instructions (Addendum)
Generalized Anxiety Disorder, Adult Generalized anxiety disorder (GAD) is a mental health disorder. People with this condition constantly worry about everyday events. Unlike normal anxiety, worry related to GAD is not triggered by a specific event. These worries also do not fade or get better with time. GAD interferes with life functions, including relationships, work, and school. GAD can vary from mild to severe. People with severe GAD can have intense waves of anxiety with physical symptoms (panic attacks). What are the causes? The exact cause of GAD is not known. What increases the risk? This condition is more likely to develop in:  Women.  People who have a family history of anxiety disorders.  People who are very shy.  People who experience very stressful life events, such as the death of a loved one.  People who have a very stressful family environment. What are the signs or symptoms? People with GAD often worry excessively about many things in their lives, such as their health and family. They may also be overly concerned about:  Doing well at work.  Being on time.  Natural disasters.  Friendships. Physical symptoms of GAD include:  Fatigue.  Muscle tension or having muscle twitches.  Trembling or feeling shaky.  Being easily startled.  Feeling like your heart is pounding or racing.  Feeling out of breath or like you cannot take a deep breath.  Having trouble falling asleep or staying asleep.  Sweating.  Nausea, diarrhea, or irritable bowel syndrome (IBS).  Headaches.  Trouble concentrating or remembering facts.  Restlessness.  Irritability. How is this diagnosed? Your health care provider can diagnose GAD based on your symptoms and medical history. You will also have a physical exam. The health care provider will ask specific questions about your symptoms, including how severe they are, when they started, and if they come and go. Your health care  provider may ask you about your use of alcohol or drugs, including prescription medicines. Your health care provider may refer you to a mental health specialist for further evaluation. Your health care provider will do a thorough examination and may perform additional tests to rule out other possible causes of your symptoms. To be diagnosed with GAD, a person must have anxiety that:  Is out of his or her control.  Affects several different aspects of his or her life, such as work and relationships.  Causes distress that makes him or her unable to take part in normal activities.  Includes at least three physical symptoms of GAD, such as restlessness, fatigue, trouble concentrating, irritability, muscle tension, or sleep problems. Before your health care provider can confirm a diagnosis of GAD, these symptoms must be present more days than they are not, and they must last for six months or longer. How is this treated? The following therapies are usually used to treat GAD:  Medicine. Antidepressant medicine is usually prescribed for long-term daily control. Antianxiety medicines may be added in severe cases, especially when panic attacks occur.  Talk therapy (psychotherapy). Certain types of talk therapy can be helpful in treating GAD by providing support, education, and guidance. Options include: ? Cognitive behavioral therapy (CBT). People learn coping skills and techniques to ease their anxiety. They learn to identify unrealistic or negative thoughts and behaviors and to replace them with positive ones. ? Acceptance and commitment therapy (ACT). This treatment teaches people how to be mindful as a way to cope with unwanted thoughts and feelings. ? Biofeedback. This process trains you to manage your body's response (  physiological response) through breathing techniques and relaxation methods. You will work with a therapist while machines are used to monitor your physical symptoms.  Stress  management techniques. These include yoga, meditation, and exercise. A mental health specialist can help determine which treatment is best for you. Some people see improvement with one type of therapy. However, other people require a combination of therapies. Follow these instructions at home:  Take over-the-counter and prescription medicines only as told by your health care provider.  Try to maintain a normal routine.  Try to anticipate stressful situations and allow extra time to manage them.  Practice any stress management or self-calming techniques as taught by your health care provider.  Do not punish yourself for setbacks or for not making progress.  Try to recognize your accomplishments, even if they are small.  Keep all follow-up visits as told by your health care provider. This is important. Contact a health care provider if:  Your symptoms do not get better.  Your symptoms get worse.  You have signs of depression, such as: ? A persistently sad, cranky, or irritable mood. ? Loss of enjoyment in activities that used to bring you joy. ? Change in weight or eating. ? Changes in sleeping habits. ? Avoiding friends or family members. ? Loss of energy for normal tasks. ? Feelings of guilt or worthlessness. Get help right away if:  You have serious thoughts about hurting yourself or others. If you ever feel like you may hurt yourself or others, or have thoughts about taking your own life, get help right away. You can go to your nearest emergency department or call:  Your local emergency services (911 in the [place]  A suicide crisis helpline, such as the National Suicide Prevention Lifeline at 313-584-4813. This is open 24 hours a day. Summary  Generalized anxiety disorder (GAD) is a mental health disorder that involves worry that is not triggered by a specific event.  People with GAD often worry excessively about many things in their lives, such as their health and  family.  GAD may cause physical symptoms such as restlessness, trouble concentrating, sleep problems, frequent sweating, nausea, diarrhea, headaches, and trembling or muscle twitching.  A mental health specialist can help determine which treatment is best for you. Some people see improvement with one type of therapy. However, other people require a combination of therapies. This information is not intended to replace advice given to you by your health care provider. Make sure you discuss any questions you have with your health care provider. Document Revised: 04/13/2017 Document Reviewed: 03/21/2016 Elsevier Patient Education  2020 [employer] Hydroxyzine capsules or tablets What is this medicine? HYDROXYZINE (hye DROX i zeen) is an antihistamine. This medicine is used to treat allergy symptoms. It is also used to treat anxiety and tension. This medicine can be used with other medicines to induce sleep before surgery. This medicine may be used for other purposes; ask your health care provider or pharmacist if you have questions. COMMON BRAND NAME(S): ANX, Atarax, Rezine, Vistaril What should I tell my health care provider before I take this medicine? They need to know if you have any of these conditions:  glaucoma  heart disease  history of irregular heartbeat  kidney disease  liver disease  lung or breathing disease, like asthma  stomach or intestine problems  thyroid disease  trouble passing urine  an unusual or allergic reaction to hydroxyzine, cetirizine, other medicines, foods, dyes or preservatives  pregnant or trying to get  pregnant  breast-feeding How should I use this medicine? Take this medicine by mouth with a full glass of water. Follow the directions on the prescription label. You may take this medicine with food or on an empty stomach. Take your medicine at regular intervals. Do not take your medicine more often than directed. Talk to your pediatrician  regarding the use of this medicine in children. Special care may be needed. While this drug may be prescribed for children as young as 72 years of age for selected conditions, precautions do apply. Patients over 52 years old may have a stronger reaction and need a smaller dose. Overdosage: If you think you have taken too much of this medicine contact a poison control center or emergency room at once. NOTE: This medicine is only for you. Do not share this medicine with others. What if I miss a dose? If you miss a dose, take it as soon as you can. If it is almost time for your next dose, take only that dose. Do not take double or extra doses. What may interact with this medicine? Do not take this medicine with any of the following medications:  cisapride  dronedarone  pimozide  thioridazine This medicine may also interact with the following medications:  alcohol  antihistamines for allergy, cough, and cold  atropine  barbiturate medicines for sleep or seizures, like phenobarbital  certain antibiotics like erythromycin or clarithromycin  certain medicines for anxiety or sleep  certain medicines for bladder problems like oxybutynin, tolterodine  certain medicines for depression or psychotic disturbances  certain medicines for irregular heart beat  certain medicines for Parkinson's disease like benztropine, trihexyphenidyl  certain medicines for seizures like phenobarbital, primidone  certain medicines for stomach problems like dicyclomine, hyoscyamine  certain medicines for travel sickness like scopolamine  ipratropium  narcotic medicines for pain  other medicines that prolong the QT interval (an abnormal heart rhythm) like dofetilide This list may not describe all possible interactions. Give your health care provider a list of all the medicines, herbs, non-prescription drugs, or dietary supplements you use. Also tell them if you smoke, drink alcohol, or use illegal drugs.  Some items may interact with your medicine. What should I watch for while using this medicine? Tell your doctor or health care professional if your symptoms do not improve. You may get drowsy or dizzy. Do not drive, use machinery, or do anything that needs mental alertness until you know how this medicine affects you. Do not stand or sit up quickly, especially if you are an older patient. This reduces the risk of dizzy or fainting spells. Alcohol may interfere with the effect of this medicine. Avoid alcoholic drinks. Your mouth may get dry. Chewing sugarless gum or sucking hard candy, and drinking plenty of water may help. Contact your doctor if the problem does not go away or is severe. This medicine may cause dry eyes and blurred vision. If you wear contact lenses you may feel some discomfort. Lubricating drops may help. See your eye doctor if the problem does not go away or is severe. If you are receiving skin tests for allergies, tell your doctor you are using this medicine. What side effects may I notice from receiving this medicine? Side effects that you should report to your doctor or health care professional as soon as possible:  allergic reactions like skin rash, itching or hives, swelling of the face, lips, or tongue  changes in vision  confusion  fast, irregular heartbeat  seizures  tremor  trouble passing urine or change in the amount of urine Side effects that usually do not require medical attention (report to your doctor or health care professional if they continue or are bothersome):  constipation  drowsiness  dry mouth  headache  tiredness This list may not describe all possible side effects. Call your doctor for medical advice about side effects. You may report side effects to FDA at 1-800-FDA-1088. Where should I keep my medicine? Keep out of the reach of children. Store at room temperature between 15 and 30 degrees C (59 and 86 degrees F). Keep container  tightly closed. Throw away any unused medicine after the expiration date. NOTE: This sheet is a summary. It may not cover all possible information. If you have questions about this medicine, talk to your doctor, pharmacist, or health care provider.  2020 Elsevier/Gold Standard (2018-04-22 13:19:55)    Baclofen tablets What is this medicine? BACLOFEN (BAK loe fen) helps relieve spasms and cramping of muscles. It may be used to treat symptoms of multiple sclerosis or spinal cord injury. This medicine may be used for other purposes; ask your health care provider or pharmacist if you have questions. COMMON BRAND NAME(S): ED Baclofen, Lioresal What should I tell my health care provider before I take this medicine? They need to know if you have any of these conditions:  kidney disease  seizures  stroke  an unusual or allergic reaction to baclofen, other medicines, foods, dyes, or preservatives  pregnant or trying to get pregnant  breast-feeding How should I use this medicine? Take this medicine by mouth. Swallow it with a drink of water. Follow the directions on the prescription label. Do not take more medicine than you are told to take. Talk to your pediatrician regarding the use of this medicine in children. Special care may be needed. Overdosage: If you think you have taken too much of this medicine contact a poison control center or emergency room at once. NOTE: This medicine is only for you. Do not share this medicine with others. What if I miss a dose? If you miss a dose, take it as soon as you can. If it is almost time for your next dose, take only that dose. Do not take double or extra doses. What may interact with this medicine? Do not take this medication with any of the following medicines:  narcotic medicines for cough This medicine may also interact with the following medications:  alcohol  antihistamines for allergy, cough and cold  certain medicines for anxiety or  sleep  certain medicines for depression like amitriptyline, fluoxetine, sertraline  certain medicines for seizures like phenobarbital, primidone  general anesthetics like halothane, isoflurane, methoxyflurane, propofol  local anesthetics like lidocaine, pramoxine, tetracaine  medicines that relax muscles for surgery  narcotic medicines for pain  phenothiazines like chlorpromazine, mesoridazine, prochlorperazine, thioridazine This list may not describe all possible interactions. Give your health care provider a list of all the medicines, herbs, non-prescription drugs, or dietary supplements you use. Also tell them if you smoke, drink alcohol, or use illegal drugs. Some items may interact with your medicine. What should I watch for while using this medicine? Tell your doctor or health care professional if your symptoms do not start to get better or if they get worse. Do not suddenly stop taking your medicine. If you do, you may develop a severe reaction. If your doctor wants you to stop the medicine, the dose will be slowly lowered over time to avoid  any side effects. Follow the advice of your doctor. You may get drowsy or dizzy. Do not drive, use machinery, or do anything that needs mental alertness until you know how this medicine affects you. Do not stand or sit up quickly, especially if you are an older patient. This reduces the risk of dizzy or fainting spells. Alcohol may interfere with the effect of this medicine. Avoid alcoholic drinks. If you are taking another medicine that also causes drowsiness, you may have more side effects. Give your health care provider a list of all medicines you use. Your doctor will tell you how much medicine to take. Do not take more medicine than directed. Call emergency for help if you have problems breathing or unusual sleepiness. What side effects may I notice from receiving this medicine? Side effects that you should report to your doctor or health care  professional as soon as possible:  allergic reactions like skin rash, itching or hives, swelling of the face, lips, or tongue  breathing problems  changes in emotions or moods  changes in vision  chest pain  fast, irregular heartbeat  feeling faint or lightheaded, falls  hallucinations  loss of balance or coordination  ringing of the ears  seizures  trouble passing urine or change in the amount of urine  trouble walking  unusually weak or tired Side effects that usually do not require medical attention (report to your doctor or health care professional if they continue or are bothersome):  changes in taste  confusion  constipation  diarrhea  dry mouth  headache  muscle weakness  nausea, vomiting  trouble sleeping This list may not describe all possible side effects. Call your doctor for medical advice about side effects. You may report side effects to FDA at 1-800-FDA-1088. Where should I keep my medicine? Keep out of the reach of children. Store at room temperature between 15 and 30 degrees C (59 and 86 degrees F). Keep container tightly closed. Throw away any unused medicine after the expiration date. NOTE: This sheet is a summary. It may not cover all possible information. If you have questions about this medicine, talk to your doctor, pharmacist, or health care provider.  2020 Elsevier/Gold Standard (2017-02-10 09:56:42)

## 2020-04-07 ENCOUNTER — Ambulatory Visit: Payer: Self-pay | Admitting: Adult Health

## 2020-04-07 ENCOUNTER — Other Ambulatory Visit: Payer: Self-pay | Admitting: Adult Health

## 2020-04-07 DIAGNOSIS — M62838 Other muscle spasm: Secondary | ICD-10-CM

## 2020-04-07 DIAGNOSIS — D72820 Lymphocytosis (symptomatic): Secondary | ICD-10-CM

## 2020-04-07 LAB — COMPREHENSIVE METABOLIC PANEL
ALT: 32 IU/L (ref 0–44)
AST: 16 IU/L (ref 0–40)
Albumin/Globulin Ratio: 1.4 (ref 1.2–2.2)
Albumin: 4.6 g/dL (ref 4.0–5.0)
Alkaline Phosphatase: 108 IU/L (ref 44–121)
BUN/Creatinine Ratio: 14 (ref 9–20)
BUN: 15 mg/dL (ref 6–20)
Bilirubin Total: 0.4 mg/dL (ref 0.0–1.2)
CO2: 29 mmol/L (ref 20–29)
Calcium: 10.1 mg/dL (ref 8.7–10.2)
Chloride: 95 mmol/L — ABNORMAL LOW (ref 96–106)
Creatinine, Ser: 1.09 mg/dL (ref 0.76–1.27)
GFR calc Af Amer: 102 mL/min/{1.73_m2} (ref >59)
GFR calc non Af Amer: 88 mL/min/{1.73_m2} (ref >59)
Globulin, Total: 3.3 g/dL (ref 1.5–4.5)
Glucose: 93 mg/dL (ref 65–99)
Potassium: 3.7 mmol/L (ref 3.5–5.2)
Sodium: 138 mmol/L (ref 134–144)
Total Protein: 7.9 g/dL (ref 6.0–8.5)

## 2020-04-07 LAB — CBC WITH DIFFERENTIAL/PLATELET
Basophils Absolute: 0.1 10*3/uL (ref 0.0–0.2)
Basos: 1 %
EOS (ABSOLUTE): 0.1 10*3/uL (ref 0.0–0.4)
Eos: 1 %
Hematocrit: 43 % (ref 37.5–51.0)
Hemoglobin: 15.1 g/dL (ref 13.0–17.7)
Immature Grans (Abs): 0 10*3/uL (ref 0.0–0.1)
Immature Granulocytes: 0 %
Lymphocytes Absolute: 2.4 10*3/uL (ref 0.7–3.1)
Lymphs: 24 %
MCH: 29.3 pg (ref 26.6–33.0)
MCHC: 35.1 g/dL (ref 31.5–35.7)
MCV: 84 fL (ref 79–97)
Monocytes Absolute: 1 10*3/uL — ABNORMAL HIGH (ref 0.1–0.9)
Monocytes: 11 %
Neutrophils Absolute: 6.2 10*3/uL (ref 1.4–7.0)
Neutrophils: 63 %
Platelets: 413 10*3/uL (ref 150–450)
RBC: 5.15 x10E6/uL (ref 4.14–5.80)
RDW: 12.9 % (ref 11.6–15.4)
WBC: 9.8 10*3/uL (ref 3.4–10.8)

## 2020-04-07 LAB — LIPID PANEL W/O CHOL/HDL RATIO
Cholesterol, Total: 256 mg/dL — ABNORMAL HIGH (ref 100–199)
HDL: 42 mg/dL (ref >39)
LDL Chol Calc (NIH): 200 mg/dL — ABNORMAL HIGH (ref 0–99)
Triglycerides: 81 mg/dL (ref 0–149)
VLDL Cholesterol Cal: 14 mg/dL (ref 5–40)

## 2020-04-07 MED ORDER — AMOXICILLIN-POT CLAVULANATE 875-125 MG PO TABS: 1.0000 | ORAL_TABLET | Freq: Two times a day (BID) | ORAL | 0 refills | Status: DC

## 2020-04-07 MED ORDER — PREDNISONE 10 MG (21) PO TBPK: ORAL_TABLET | ORAL | 0 refills | Status: DC

## 2020-04-07 NOTE — Progress Notes (Signed)
Patients WBC has returned to normal.  CMP ok.  Total cholesterol and LDL elevated.  Discuss lifestyle modification with patient e.g. increase exercise, fiber, fruits, vegetables, lean meat, and omega 3/fish intake and decrease saturated fat.  If patient following strict diet and exercise program already please schedule follow up appointment with primary care physician  Will refill his prednisone and want him to take Augmentin sent to pharmacy to be sure all infection and swelling completely resolves.   Follow up if not improving, see pulmonary as referred for incidental small lung nodule on right side.   Recheck CBC, CMP and lipid panel in 6 months.

## 2020-04-12 ENCOUNTER — Encounter: Payer: Self-pay | Admitting: Adult Health

## 2020-05-04 ENCOUNTER — Ambulatory Visit: Payer: BC Managed Care – PPO | Admitting: Pulmonary Disease

## 2020-05-20 ENCOUNTER — Other Ambulatory Visit: Payer: Self-pay

## 2020-05-20 ENCOUNTER — Other Ambulatory Visit: Payer: BC Managed Care – PPO

## 2020-05-20 DIAGNOSIS — Z20822 Contact with and (suspected) exposure to covid-19: Secondary | ICD-10-CM | POA: Diagnosis not present

## 2020-05-24 ENCOUNTER — Telehealth: Payer: Self-pay

## 2020-05-24 LAB — NOVEL CORONAVIRUS, NAA: SARS-CoV-2, NAA: DETECTED — AB

## 2020-05-24 NOTE — Telephone Encounter (Signed)
Test done through community testing and results addressed by [facility]    Copied from CRM [id number]. Topic: General - Inquiry >> May 24, 2020  9:31 AM Daphine Deutscher D wrote: Reason for CRM: Pt called wanting to know his lab results for covid.  He had the test on the 6th. I looked but the test results are still not in. He would like a cal from the office  they come in.    CB#  [phone]

## 2020-05-25 ENCOUNTER — Encounter: Payer: Self-pay | Admitting: Adult Health

## 2020-05-25 ENCOUNTER — Ambulatory Visit: Payer: Self-pay | Admitting: Adult Health

## 2020-05-25 ENCOUNTER — Telehealth: Payer: Self-pay

## 2020-05-25 NOTE — Telephone Encounter (Signed)
Contacted pt. In regard to COVID 19 treatments. Pt. Is out of the 7 day window and declines at this time.

## 2020-05-25 NOTE — Telephone Encounter (Signed)
Patient called stating that he had not received his COVID-19 test result.  He was informed that his test was positive and he can pass the germ to others. He states that his symptoms were cough congestion chills body aches but they have gotten better.  Symptom tiers and treatment plans for each were read to patient.  Criteria for ending isolation were read to patient.  Good preventative practices were discussed. He verbalized understanding of all information.  He was told to look for theses same instructions in his My Chart account and to please call back if he had any additional questions. HD has been notified

## 2020-05-27 ENCOUNTER — Other Ambulatory Visit: Payer: BC Managed Care – PPO

## 2020-05-27 ENCOUNTER — Other Ambulatory Visit: Payer: Self-pay

## 2020-05-27 DIAGNOSIS — Z20822 Contact with and (suspected) exposure to covid-19: Secondary | ICD-10-CM

## 2020-05-30 LAB — NOVEL CORONAVIRUS, NAA: SARS-CoV-2, NAA: NOT DETECTED

## 2020-06-02 ENCOUNTER — Other Ambulatory Visit: Payer: Self-pay

## 2020-06-02 ENCOUNTER — Ambulatory Visit (INDEPENDENT_AMBULATORY_CARE_PROVIDER_SITE_OTHER): Payer: BC Managed Care – PPO | Admitting: Pulmonary Disease

## 2020-06-02 ENCOUNTER — Encounter: Payer: Self-pay | Admitting: Pulmonary Disease

## 2020-06-02 VITALS — BP 128/68 | HR 81 | Ht 64.5 in | Wt 222.6 lb

## 2020-06-02 DIAGNOSIS — R911 Solitary pulmonary nodule: Secondary | ICD-10-CM

## 2020-06-02 NOTE — Progress Notes (Signed)
Nathan Nelson    [id number]    1986-05-15  Primary Care Physician:Flinchum, Eula Fried, FNP  Referring Physician: Berniece Pap, FNP [address] [place],  [place] [address]  Chief complaint: Consult for lung nodule  HPI: 35 year old with hypertension, hyperlipidemia Complains of intermittent atypical chest pain, chest pressure associated with tenderness on palpation.  He got a CT scan chest to look for soft tissue abnormalities on November 2021 which is normal except for 3 mm pulmonary nodule and has been referred here for further evaluation.  No dyspnea, cough, sputum production, hemoptysis, fevers or chills.  Pets: Has a dog and a snake Occupation: Administrator, [employer] at a warehouse Exposures: No mold, hot tub, Jacuzzi.  No feather pillows or comforters Smoking history: 3-pack-year smoker.  Quit in 2015 Travel history: No significant travel history Relevant family history: No significant family history of lung disease  Outpatient Encounter Medications as of 06/02/2020  Medication Sig  . amLODipine (NORVASC) 10 MG tablet Take 10 mg by mouth daily.   . chlorthalidone (HYGROTON) 50 MG tablet Take by mouth.  Marland Kitchen FLUoxetine (PROZAC) 20 MG capsule Take 60 mg by mouth every morning.  . potassium chloride SA (KLOR-CON) 20 MEQ tablet Take 20 mEq by mouth 2 (two) times daily.  Marland Kitchen omeprazole (PRILOSEC) 20 MG capsule Take 1 capsule (20 mg total) by mouth daily. (Patient not taking: Reported on 06/02/2020)  . [DISCONTINUED] amoxicillin-clavulanate (AUGMENTIN) 875-125 MG tablet Take 1 tablet by mouth 2 (two) times daily.  . [DISCONTINUED] baclofen (LIORESAL) 10 MG tablet Take 1 tablet (10 mg total) by mouth 2 (two) times daily. For muscle spasm.  . [DISCONTINUED] hydrOXYzine (ATARAX/VISTARIL) 50 MG tablet Take 1 tablet (50 mg total) by mouth 3 (three) times daily as needed. May cause drowsiness.  . [DISCONTINUED] predniSONE (STERAPRED UNI-PAK 21 TAB) 10 MG (21) TBPK  tablet PO: Take 6 tablets on day 1:Take 5 tablets day 2:Take 4 tablets day 3: Take 3 tablets day 4:Take 2 tablets day five: 5 Take 1 tablet day 6  . [DISCONTINUED] Vitamin D, Ergocalciferol, (DRISDOL) 1.25 MG (50000 UNIT) CAPS capsule Take 1 capsule (50,000 Units total) by mouth every 7 (seven) days. (taking one tablet per week) labs 1- 2 weeks after completing medication.   No facility-administered encounter medications on file as of 06/02/2020.    Allergies as of 06/02/2020 - Review Complete 06/02/2020  Allergen Reaction Noted  . Lisinopril Other (See Comments) 04/22/2011    Past Medical History:  Diagnosis Date  . Anxiety   . Hypertension     Past Surgical History:  Procedure Laterality Date  . KNEE SURGERY      Family History  Problem Relation Age of Onset  . Hypertension Mother   . Diabetes Father   . Hypertension Father     Social History   Socioeconomic History  . Marital status: Married    Spouse name: Not on file  . Number of children: Not on file  . Years of education: Not on file  . Highest education level: Not on file  Occupational History  . Not on file  Tobacco Use  . Smoking status: Former Smoker    Quit date: 03/02/2014    Years since quitting: 6.2  . Smokeless tobacco: Never Used  Substance and Sexual Activity  . Alcohol use: Yes    Alcohol/week: 0.0 standard drinks    Comment: rarely  . Drug use: No  . Sexual activity: Not  on file  Other Topics Concern  . Not on file  Social History Narrative  . Not on file   Social Determinants of Health   Financial Resource Strain: Not on file  Food Insecurity: Not on file  Transportation Needs: Not on file  Physical Activity: Not on file  Stress: Not on file  Social Connections: Not on file  Intimate Partner Violence: Not on file    Review of systems: Review of Systems  Constitutional: Negative for fever and chills.  HENT: Negative.   Eyes: Negative for blurred vision.  Respiratory: as per HPI   Cardiovascular: Negative for chest pain and palpitations.  Gastrointestinal: Negative for vomiting, diarrhea, blood per rectum. Genitourinary: Negative for dysuria, urgency, frequency and hematuria.  Musculoskeletal: Negative for myalgias, back pain and joint pain.  Skin: Negative for itching and rash.  Neurological: Negative for dizziness, tremors, focal weakness, seizures and loss of consciousness.  Endo/Heme/Allergies: Negative for environmental allergies.  Psychiatric/Behavioral: Negative for depression, suicidal ideas and hallucinations.  All other systems reviewed and are negative.  Physical Exam: Blood pressure 128/68, pulse 81, height 5' 4.5" (1.638 m), weight 222 lb 9.6 oz (101 kg), SpO2 98 %. Gen:      No acute distress HEENT:  EOMI, sclera anicteric Neck:     No masses; no thyromegaly Lungs:    Clear to auscultation bilaterally; normal respiratory effort CV:         Regular rate and rhythm; no murmurs Abd:      + bowel sounds; soft, non-tender; no palpable masses, no distension Ext:    No edema; adequate peripheral perfusion Skin:      Warm and dry; no rash Neuro: alert and oriented x 3 Psych: normal mood and affect  Data Reviewed: Imaging: CT chest 04/01/2020- 3 mm lung nodules, no lung abnormalities. I have reviewed the images personally.  PFTs:  Labs:  Assessment:  Pulmonary nodules He has tiny pulmonary nodules which are likely benign.  However given smoking history albeit minimal we can monitor this with a CT scan in 1 year.  Plan/Recommendations: CT chest without contrast in 1 year.  Chilton Greathouse MD [facility] 06/02/2020, 11:49 AM  CC: Flinchum, Eula Fried, F*

## 2020-06-02 NOTE — Patient Instructions (Signed)
I am glad you are doing well with regard to your breathing I have reviewed your CT scan which shows tiny lung nodule which is most likely benign  However given your smoking history we would like to monitor this.  CT scan without contrast in 1 year for lung nodule Follow-up in clinic after CT scan

## 2020-06-04 ENCOUNTER — Encounter: Payer: Self-pay | Admitting: Pulmonary Disease

## 2020-06-09 DIAGNOSIS — M9901 Segmental and somatic dysfunction of cervical region: Secondary | ICD-10-CM | POA: Diagnosis not present

## 2020-06-09 DIAGNOSIS — G540 Brachial plexus disorders: Secondary | ICD-10-CM | POA: Diagnosis not present

## 2020-06-09 DIAGNOSIS — M542 Cervicalgia: Secondary | ICD-10-CM | POA: Diagnosis not present

## 2020-06-09 DIAGNOSIS — M436 Torticollis: Secondary | ICD-10-CM | POA: Diagnosis not present

## 2020-07-01 ENCOUNTER — Encounter: Payer: Self-pay | Admitting: Adult Health

## 2020-07-02 ENCOUNTER — Other Ambulatory Visit: Payer: Self-pay | Admitting: Adult Health

## 2020-07-02 DIAGNOSIS — I10 Essential (primary) hypertension: Secondary | ICD-10-CM

## 2020-07-02 MED ORDER — CHLORTHALIDONE 50 MG PO TABS: 50.0000 mg | ORAL_TABLET | Freq: Every day | ORAL | 0 refills | Status: DC

## 2020-07-02 MED ORDER — AMLODIPINE BESYLATE 10 MG PO TABS
10.0000 mg | ORAL_TABLET | Freq: Every day | ORAL | 0 refills | Status: DC
Start: 2020-07-02 — End: 2020-10-05

## 2020-07-02 NOTE — Telephone Encounter (Signed)
Refilled blood pressure medications per patient request via MyChart.

## 2020-07-02 NOTE — Progress Notes (Signed)
Refill   Meds ordered this encounter  Medications  . amLODipine (NORVASC) 10 MG tablet    Sig: Take 1 tablet (10 mg total) by mouth daily.    Dispense:  90 tablet    Refill:  0  . chlorthalidone (HYGROTON) 50 MG tablet    Sig: Take 1 tablet (50 mg total) by mouth daily.    Dispense:  90 tablet    Refill:  0    Orders Placed This Encounter  Procedures  . CBC with Differential/Platelet  . Comprehensive metabolic panel

## 2020-07-05 DIAGNOSIS — M542 Cervicalgia: Secondary | ICD-10-CM | POA: Diagnosis not present

## 2020-07-05 DIAGNOSIS — M436 Torticollis: Secondary | ICD-10-CM | POA: Diagnosis not present

## 2020-07-05 DIAGNOSIS — G540 Brachial plexus disorders: Secondary | ICD-10-CM | POA: Diagnosis not present

## 2020-07-05 DIAGNOSIS — M9901 Segmental and somatic dysfunction of cervical region: Secondary | ICD-10-CM | POA: Diagnosis not present

## 2020-07-07 ENCOUNTER — Ambulatory Visit (INDEPENDENT_AMBULATORY_CARE_PROVIDER_SITE_OTHER): Payer: Self-pay | Admitting: Adult Health

## 2020-07-07 ENCOUNTER — Encounter: Payer: Self-pay | Admitting: Adult Health

## 2020-07-07 DIAGNOSIS — Z5329 Procedure and treatment not carried out because of patient's decision for other reasons: Secondary | ICD-10-CM

## 2020-07-07 DIAGNOSIS — Z91199 Patient's noncompliance with other medical treatment and regimen due to unspecified reason: Secondary | ICD-10-CM | POA: Insufficient documentation

## 2020-07-07 NOTE — Progress Notes (Signed)
   Complete physical exam  Patient: Nathan Nelson   DOB: 03/04/2000   35 y.o. Male  MRN: [id number]  Subjective:    No chief complaint on file.   Nathan Nelson is a 35 y.o. male who presents today for a complete physical exam. She reports consuming a {diet types:17450} diet. {types:19826} She generally feels {DESC; WELL/FAIRLY WELL/POORLY:18703}. She reports sleeping {DESC; WELL/FAIRLY WELL/POORLY:18703}. She {does/does not:200015} have additional problems to discuss today.    Most recent fall risk assessment:    11/09/2021   10:42 AM  Fall Risk   Falls in the past year? 0  Number falls in past yr: 0  Injury with Fall? 0  Risk for fall due to : No Fall Risks  Follow up Falls evaluation completed     Most recent depression screenings:    11/09/2021   10:42 AM 09/30/2020   10:46 AM  PHQ 2/9 Scores  PHQ - 2 Score 0 0  PHQ- 9 Score 5     {VISON DENTAL STD PSA (Optional):27386}  {History (Optional):23778}  Patient Care Team: Jessup, Joy, NP as PCP - General (Nurse Practitioner)   Outpatient Medications Prior to Visit  Medication Sig   fluticasone (FLONASE) 50 MCG/ACT nasal spray Place 2 sprays into both nostrils in the morning and at bedtime. After 7 days, reduce to once daily.   norgestimate-ethinyl estradiol (SPRINTEC 28) 0.25-35 MG-MCG tablet Take 1 tablet by mouth daily.   Nystatin POWD Apply liberally to affected area 2 times per day   spironolactone (ALDACTONE) 100 MG tablet Take 1 tablet (100 mg total) by mouth daily.   No facility-administered medications prior to visit.    ROS        Objective:     There were no vitals taken for this visit. {Vitals History (Optional):23777}  Physical Exam   No results found for any visits on 12/15/21. {Show previous labs (optional):23779}    Assessment & Plan:    Routine Health Maintenance and Physical Exam  Immunization History  Administered Date(s) Administered   DTaP 05/18/1999, 07/14/1999,  09/22/1999, 06/07/2000, 12/22/2003   Hepatitis A 10/18/2007, 10/23/2008   Hepatitis B 03/05/1999, 04/12/1999, 09/22/1999   HiB (PRP-OMP) 05/18/1999, 07/14/1999, 09/22/1999, 06/07/2000   IPV 05/18/1999, 07/14/1999, 03/12/2000, 12/22/2003   Influenza,inj,Quad PF,6+ Mos 01/23/2014   Influenza-Unspecified 04/24/2012   MMR 03/12/2001, 12/22/2003   Meningococcal Polysaccharide 10/23/2011   Pneumococcal Conjugate-13 06/07/2000   Pneumococcal-Unspecified 09/22/1999, 12/06/1999   Tdap 10/23/2011   Varicella 03/12/2000, 10/18/2007    Health Maintenance  Topic Date Due   HIV Screening  Never done   Hepatitis C Screening  Never done   INFLUENZA VACCINE  12/13/2021   PAP-Cervical Cytology Screening  12/15/2021 (Originally 03/03/2020)   PAP SMEAR-Modifier  12/15/2021 (Originally 03/03/2020)   TETANUS/TDAP  12/15/2021 (Originally 10/22/2021)   HPV VACCINES  Discontinued   COVID-19 Vaccine  Discontinued    Discussed health benefits of physical activity, and encouraged her to engage in regular exercise appropriate for her age and condition.  Problem List Items Addressed This Visit   None Visit Diagnoses     Annual physical exam    -  Primary   Cervical cancer screening       Need for Tdap vaccination          No follow-ups on file.     Joy Jessup, NP   

## 2020-07-16 ENCOUNTER — Other Ambulatory Visit: Payer: Self-pay

## 2020-07-16 ENCOUNTER — Ambulatory Visit (INDEPENDENT_AMBULATORY_CARE_PROVIDER_SITE_OTHER): Payer: Self-pay | Admitting: Adult Health

## 2020-07-16 ENCOUNTER — Encounter: Payer: Self-pay | Admitting: Adult Health

## 2020-07-16 VITALS — BP 122/90 | HR 68 | Resp 16 | Wt 217.0 lb

## 2020-07-16 DIAGNOSIS — E785 Hyperlipidemia, unspecified: Secondary | ICD-10-CM

## 2020-07-16 DIAGNOSIS — E559 Vitamin D deficiency, unspecified: Secondary | ICD-10-CM

## 2020-07-16 DIAGNOSIS — K219 Gastro-esophageal reflux disease without esophagitis: Secondary | ICD-10-CM

## 2020-07-16 DIAGNOSIS — F419 Anxiety disorder, unspecified: Secondary | ICD-10-CM

## 2020-07-16 DIAGNOSIS — I10 Essential (primary) hypertension: Secondary | ICD-10-CM

## 2020-07-16 MED ORDER — FLUOXETINE HCL 10 MG PO CAPS: 50.0000 mg | ORAL_CAPSULE | Freq: Every morning | ORAL | 0 refills | Status: DC

## 2020-07-16 MED ORDER — OMEPRAZOLE 40 MG PO CPDR
40.0000 mg | DELAYED_RELEASE_CAPSULE | Freq: Every day | ORAL | 1 refills | Status: DC
Start: 2020-07-16 — End: 2021-04-19

## 2020-07-16 MED ORDER — HYDROXYZINE HCL 10 MG PO TABS: 10.0000 mg | ORAL_TABLET | Freq: Three times a day (TID) | ORAL | 0 refills | Status: DC | PRN

## 2020-07-16 NOTE — Progress Notes (Signed)
Established patient visit   Patient: Nathan Nelson   DOB: 1985-11-13   35 y.o. Male  MRN: [id number] Visit Date: 07/16/2020  Today's healthcare provider: Jairo Ben, FNP   Chief Complaint  Patient presents with  . Hypertension  . Hyperlipidemia  . Anxiety   Subjective    HPI  Hypertension, follow-up  BP Readings from Last 3 Encounters:  07/16/20 122/90  06/02/20 128/68  04/06/20 124/84   Wt Readings from Last 3 Encounters:  07/16/20 217 lb (98.4 kg)  06/02/20 222 lb 9.6 oz (101 kg)  04/06/20 225 lb (102.1 kg)     He was last seen for hypertension 3 months ago.  BP at that visit was 133/92. Management since that visit includes none.  He reports excellent compliance with treatment. He is not having side effects.  He is following a Low fat diet. Patient reports that he is on a diet where he has two shakes a day and protein/lean meat in the evening.  He is exercising. He does not smoke.  Use of agents associated with hypertension: none.   Outside blood pressures are being checked on occasion. Symptoms: No chest pain No chest pressure  No palpitations No syncope  No dyspnea No orthopnea  No paroxysmal nocturnal dyspnea No lower extremity edema    He was seeing provider in [place] and was given the Prozac 60 mg. He is not having any panic attacks. He does have some sleepiness at this dose of Prozac 60 mg is too much. He would like to try and decrease dosage,   He was changed to Lexapro, and did not like the way made him feel. No longer on metoprolol.  Atarax 50 mg TID PRN for anxiety makes him drowsy.   Has GERD symptoms not taking omeprazole. Denied any hemoptysis or dark black or bleeding stools.  Pertinent labs: Lab Results  Component Value Date   CHOL 256 (H) 04/06/2020   HDL 42 04/06/2020   LDLCALC 200 (H) 04/06/2020   TRIG 81 04/06/2020   CHOLHDL 6.7 (H) 03/23/2020   Lab Results  Component Value Date   NA 138 04/06/2020   K  3.7 04/06/2020   CREATININE 1.09 04/06/2020   GFRNONAA 88 04/06/2020   GFRAA 102 04/06/2020   GLUCOSE 93 04/06/2020     The ASCVD Risk score (Goff DC Jr., et al., 2013) failed to calculate for the following reasons:   The 2013 ASCVD risk score is only valid for ages 5 to 82   --------------------------------------------------------------------------------------------------- Lipid/Cholesterol, Follow-up  Last lipid panel Other pertinent labs  Lab Results  Component Value Date   CHOL 256 (H) 04/06/2020   HDL 42 04/06/2020   LDLCALC 200 (H) 04/06/2020   TRIG 81 04/06/2020   CHOLHDL 6.7 (H) 03/23/2020   Lab Results  Component Value Date   ALT 32 04/06/2020   AST 16 04/06/2020   PLT 413 04/06/2020   TSH 1.080 03/23/2020     He was last seen for this 3 months ago.  Management since that visit includes none, recommended starting patient on Statin. Patient states since visit he has been working on diet and exercise  Symptoms: No chest pain No chest pressure/discomfort  No dyspnea No lower extremity edema  No numbness or tingling of extremity No orthopnea  No palpitations No paroxysmal nocturnal dyspnea  No speech difficulty No syncope   Current diet: in general, a "healthy" diet   Current exercise: walking  The ASCVD Risk  score Denman George DC Jr., et al., 2013) failed to calculate for the following reasons:   The 2013 ASCVD risk score is only valid for ages 56 to 24  --------------------------------------------------------------------------------------------------- Anxiety, Follow-up  He was last seen for anxiety 3 months ago. Changes made at last visit include continue Hydorxyzine. Patient reports that he takes Prozac 60mg  qhs   He reports good compliance with treatment. He reports good tolerance of treatment. He is not having side effects.   He feels his anxiety is mild and Improved since last visit.  Symptoms: No chest pain No difficulty concentrating  No dizziness  Yes fatigue  No feelings of losing control Yes insomnia  No irritable No palpitations  No panic attacks Yes racing thoughts  No shortness of breath No sweating  No tremors/shakes    GAD-7 Results GAD-7 Generalized Anxiety Disorder Screening Tool 04/06/2020  1. Feeling Nervous, Anxious, or on Edge 3  2. Not Being Able to Stop or Control Worrying 3  3. Worrying Too Much About Different Things 1  4. Trouble Relaxing 0  5. Being So Restless it's Hard To Sit Still 1  6. Becoming Easily Annoyed or Irritable 3  7. Feeling Afraid As If Something Awful Might Happen 1  Total GAD-7 Score 12  Difficulty At Work, Home, or Getting  Along With Others? Somewhat difficult    PHQ-9 Scores PHQ9 SCORE ONLY 03/19/2020  PHQ-9 Total Score 11    ---------------------------------------------------------------------------------------------------  Patient Active Problem List   Diagnosis Date Noted  . No-show for appointment 07/07/2020  . Hyperlipidemia 04/06/2020  . Body mass index (BMI) of 38.0-38.9 in adult 04/06/2020  . Muscle spasm 04/06/2020  . Lymphocytosis 03/24/2020  . Screening for blood or protein in urine 03/19/2020  . Rib pain on left side 03/19/2020  . Vitamin D deficiency 03/19/2020  . Cervical pain 03/19/2020  . Strain of neck muscle 02/27/2019  . Pain in joint of right shoulder 09/19/2018  . Anxiety 12/06/2015  . Right shoulder injury 09/28/2015  . Costochondritis 06/11/2014  . URI (upper respiratory infection) 05/30/2012  . Hypertension    Past Medical History:  Diagnosis Date  . Anxiety   . Hypertension    Allergies  Allergen Reactions  . Lisinopril Other (See Comments)    Tightness in chest       Medications: Outpatient Medications Prior to Visit  Medication Sig Note  . amLODipine (NORVASC) 10 MG tablet Take 1 tablet (10 mg total) by mouth daily.   . chlorthalidone (HYGROTON) 50 MG tablet Take 1 tablet (50 mg total) by mouth daily.   . hydrochlorothiazide  (HYDRODIURIL) 25 MG tablet Take 1 tablet by mouth daily.   . potassium chloride SA (KLOR-CON) 20 MEQ tablet Take 20 mEq by mouth 2 (two) times daily.   . [DISCONTINUED] FLUoxetine (PROZAC) 20 MG capsule Take 60 mg by mouth every morning.   . [DISCONTINUED] metoprolol succinate (TOPROL-XL) 25 MG 24 hr tablet Take 1 tablet by mouth daily. 07/16/2020: not taking   . [DISCONTINUED] escitalopram (LEXAPRO) 10 MG tablet escitalopram 10 mg tablet (Patient not taking: Reported on 07/16/2020)   . [DISCONTINUED] omeprazole (PRILOSEC) 20 MG capsule Take 1 capsule (20 mg total) by mouth daily. (Patient not taking: No sig reported)    No facility-administered medications prior to visit.    Review of Systems  Constitutional: Negative.   HENT: Negative.   Respiratory: Negative.   Cardiovascular: Negative.   Gastrointestinal: Negative.   Genitourinary: Negative.   Musculoskeletal: Positive for arthralgias. Negative  for back pain, gait problem, joint swelling, myalgias, neck pain and neck stiffness.  Skin: Negative.   Neurological: Negative.   Hematological: Negative.   Psychiatric/Behavioral: Negative for agitation, behavioral problems, confusion, decreased concentration, dysphoric mood, hallucinations, self-injury, sleep disturbance and suicidal ideas. The patient is nervous/anxious. The patient is not hyperactive.     Last CBC Lab Results  Component Value Date   WBC 9.8 04/06/2020   HGB 15.1 04/06/2020   HCT 43.0 04/06/2020   MCV 84 04/06/2020   MCH 29.3 04/06/2020   RDW 12.9 04/06/2020   PLT 413 04/06/2020   Last metabolic panel Lab Results  Component Value Date   GLUCOSE 93 04/06/2020   NA 138 04/06/2020   K 3.7 04/06/2020   CL 95 (L) 04/06/2020   CO2 29 04/06/2020   BUN 15 04/06/2020   CREATININE 1.09 04/06/2020   GFRNONAA 88 04/06/2020   GFRAA 102 04/06/2020   CALCIUM 10.1 04/06/2020   PROT 7.9 04/06/2020   ALBUMIN 4.6 04/06/2020   LABGLOB 3.3 04/06/2020   AGRATIO 1.4 04/06/2020    BILITOT 0.4 04/06/2020   ALKPHOS 108 04/06/2020   AST 16 04/06/2020   ALT 32 04/06/2020   ANIONGAP 22 (H) 03/18/2019   Last lipids Lab Results  Component Value Date   CHOL 256 (H) 04/06/2020   HDL 42 04/06/2020   LDLCALC 200 (H) 04/06/2020   TRIG 81 04/06/2020   CHOLHDL 6.7 (H) 03/23/2020   Last hemoglobin A1c Lab Results  Component Value Date   HGBA1C (H) 09/14/2009    5.7 (NOTE)                                                                       According to the ADA Clinical Practice Recommendations for 2011, when HbA1c is used as a screening test:   >=6.5%   Diagnostic of Diabetes Mellitus           (if abnormal result  is confirmed)  5.7-6.4%   Increased risk of developing Diabetes Mellitus  References:Diagnosis and Classification of Diabetes Mellitus,Diabetes Care,2011,34(Suppl 1):S62-S69 and Standards of Medical Care in         Diabetes - 2011,Diabetes Care,2011,34  (Suppl 1):S11-S61.   Last thyroid functions Lab Results  Component Value Date   TSH 1.080 03/23/2020   Last vitamin D Lab Results  Component Value Date   VD25OH 17.8 (L) 03/23/2020   Last vitamin B12 and Folate No results found for: VITAMINB12, FOLATE     Objective    BP 122/90   Pulse 68   Resp 16   Wt 217 lb (98.4 kg)   SpO2 98%   BMI 36.67 kg/m     Physical Exam Vitals reviewed.  Constitutional:      General: He is active. He is not in acute distress.    Appearance: Normal appearance. He is well-developed and well-nourished. He is not ill-appearing, toxic-appearing, sickly-appearing or diaphoretic.     Comments: Patient appers well, not sickly. Speaking in complete sentences. Patient moves on and off of exam table and in room without difficulty. Gait is normal in hall and in room. Patient is oriented to person place time and situation. Patient answers questions appropriately and engages eye contact and verbal dialect with  provider.    HENT:     Head: Normocephalic and atraumatic.      Right Ear: Hearing, tympanic membrane, ear canal and external ear normal.     Left Ear: Hearing, tympanic membrane, ear canal and external ear normal.     Nose: Nose normal.     Mouth/Throat:     Mouth: Oropharynx is clear and moist and mucous membranes are normal.     Pharynx: Uvula midline. No oropharyngeal exudate.  Eyes:     General: Lids are normal. No scleral icterus.       Right eye: No discharge.        Left eye: No discharge.     Extraocular Movements: EOM normal.     Conjunctiva/sclera: Conjunctivae normal.     Pupils: Pupils are equal, round, and reactive to light.  Neck:     Thyroid: No thyromegaly.     Vascular: Normal carotid pulses. No carotid bruit, hepatojugular reflux or JVD.     Trachea: Trachea and phonation normal. No tracheal tenderness or tracheal deviation.     Meningeal: Brudzinski's sign absent.  Cardiovascular:     Rate and Rhythm: Normal rate and regular rhythm.     Pulses: Normal pulses and intact distal pulses.     Heart sounds: Normal heart sounds, S1 normal and S2 normal. Heart sounds not distant. No murmur heard. No friction rub. No gallop.   Pulmonary:     Effort: Pulmonary effort is normal. No accessory muscle usage or respiratory distress.     Breath sounds: Normal breath sounds. No stridor. No wheezing, rhonchi or rales.  Chest:     Chest wall: No tenderness.  Abdominal:     General: Bowel sounds are normal. There is no distension. Aorta is normal.     Palpations: Abdomen is soft. There is no mass.     Tenderness: There is no abdominal tenderness. There is no right CVA tenderness, left CVA tenderness, guarding or rebound.     Hernia: No hernia is present.  Musculoskeletal:        General: No tenderness, deformity or edema. Normal range of motion.     Cervical back: Full passive range of motion without pain, normal range of motion and neck supple.  Lymphadenopathy:     Head:     Right side of head: No submental, submandibular, tonsillar,  preauricular, posterior auricular or occipital adenopathy.     Left side of head: No submental, submandibular, tonsillar, preauricular, posterior auricular or occipital adenopathy.     Cervical: No cervical adenopathy.     Upper Body:  No axillary adenopathy present. Skin:    General: Skin is warm, dry and intact.     Coloration: Skin is not pale.     Findings: No erythema or rash.     Nails: There is no clubbing or cyanosis.  Neurological:     Mental Status: He is alert and oriented to person, place, and time.     GCS: GCS eye subscore is 4. GCS verbal subscore is 5. GCS motor subscore is 6.     Cranial Nerves: No cranial nerve deficit.     Sensory: No sensory deficit.     Motor: No weakness or abnormal muscle tone.     Coordination: He displays a negative Romberg sign. Coordination normal.     Gait: Gait normal.     Deep Tendon Reflexes: Strength normal and reflexes are normal and symmetric. Reflexes normal.  Psychiatric:  Mood and Affect: Mood and affect and mood normal.        Speech: Speech normal.        Behavior: Behavior normal.        Thought Content: Thought content normal.        Cognition and Memory: Cognition and memory normal.        Judgment: Judgment normal.      No results found for any visits on 07/16/20.  Assessment & Plan     1. Anxiety Discontinue Prozac 60 mg daily will decrease dose to as below. Also decreasing dose of Atarax, Follow up if worsening. Try taking Prozac in the morning since you are wired at night. refferal to psychiatry advised patient declines at this time.   - FLUoxetine (PROZAC) 10 MG capsule; Take 5 capsules (50 mg total) by mouth every morning.  Dispense: 150 capsule; Refill: 0 - hydrOXYzine (ATARAX/VISTARIL) 10 MG tablet; Take 1 tablet (10 mg total) by mouth 3 (three) times daily as needed.  Dispense: 90 tablet; Refill: 0 - Comprehensive Metabolic Panel (CMET) - TSH  2. Gastroesophageal reflux disease, unspecified whether  esophagitis present Refer to Gastrointestinal  if worsening.  - omeprazole (PRILOSEC) 40 MG capsule; Take 1 capsule (40 mg total) by mouth daily.  Dispense: 90 capsule; Refill: 1  3. Vitamin D deficiency - VITAMIN D 25 Hydroxy (Vit-D Deficiency, Fractures)  4. Hypertension, unspecified type Continue current medications, keep log.  - CBC with Differential/Platelet  5. Hyperlipidemia, unspecified hyperlipidemia type Recheck labs.  - Lipid Panel w/o Chol/HDL Ratio   He would like to schedule Covid Booster.   Orders Placed This Encounter  Procedures  . VITAMIN D 25 Hydroxy (Vit-D Deficiency, Fractures)  . CBC with Differential/Platelet  . Lipid Panel w/o Chol/HDL Ratio  . Comprehensive Metabolic Panel (CMET)  . TSH    Red Flags discussed. The patient was given clear instructions to go to ER or return to medical center if any red flags develop, symptoms do not improve, worsen or new problems develop. They verbalized understanding.  Return in about 1 month (around 08/16/2020), or if symptoms worsen or fail to improve, for at any time for any worsening symptoms, Go to Emergency room/ urgent care if worse.     The entirety of the information documented in the History of Present Illness, Review of Systems and Physical Exam were personally obtained by me. Portions of this information were initially documented by the CMA and reviewed by me for thoroughness and accuracy.      Jairo BenMichelle Smith Azariya Freeman, FNP  [facility] [phone] (phone) [phone] (fax)  [facility]

## 2020-07-16 NOTE — Patient Instructions (Signed)
Food Choices for Gastroesophageal Reflux Disease, Adult When you have gastroesophageal reflux disease (GERD), the foods you eat and your eating habits are very important. Choosing the right foods can help ease your discomfort. Think about working with a food expert (dietitian) to help you make good choices. What are tips for following this plan? Reading food labels  Look for foods that are low in saturated fat. Foods that may help with your symptoms include: ? Foods that have less than 5% of daily value (DV) of fat. ? Foods that have 0 grams of trans fat. Cooking  Do not fry your food.  Cook your food by baking, steaming, grilling, or broiling. These are all methods that do not need a lot of fat for cooking.  To add flavor, try to use herbs that are low in spice and acidity. Meal planning  Choose healthy foods that are low in fat, such as: ? Fruits and vegetables. ? Whole grains. ? Low-fat dairy products. ? Lean meats, fish, and poultry.  Eat small meals often instead of eating 3 large meals each day. Eat your meals slowly in a place where you are relaxed. Avoid bending over or lying down until 2-3 hours after eating.  Limit high-fat foods such as fatty meats or fried foods.  Limit your intake of fatty foods, such as oils, butter, and shortening.  Avoid the following as told by your doctor: ? Foods that cause symptoms. These may be different for different people. Keep a food diary to keep track of foods that cause symptoms. ? Alcohol. ? Drinking a lot of liquid with meals. ? Eating meals during the 2-3 hours before bed.   Lifestyle  Stay at a healthy weight. Ask your doctor what weight is healthy for you. If you need to lose weight, work with your doctor to do so safely.  Exercise for at least 30 minutes on 5 or more days each week, or as told by your doctor.  Wear loose-fitting clothes.  Do not smoke or use any products that contain nicotine or tobacco. If you need help  quitting, ask your doctor.  Sleep with the head of your bed higher than your feet. Use a wedge under the mattress or blocks under the bed frame to raise the head of the bed.  Chew sugar-free gum after meals. What foods should eat? Eat a healthy, well-balanced diet of fruits, vegetables, whole grains, low-fat dairy products, lean meats, fish, and poultry. Each person is different. Foods that may cause symptoms in one person may not cause any symptoms in another person. Work with your doctor to find foods that are safe for you. The items listed above may not be a complete list of what you can eat and drink. Contact a food expert for more options.   What foods should I avoid? Limiting some of these foods may help in managing the symptoms of GERD. Everyone is different. Talk with a food expert or your doctor to help you find the exact foods to avoid, if any. Fruits Any fruits prepared with added fat. Any fruits that cause symptoms. For some people, this may include citrus fruits, such as oranges, grapefruit, pineapple, and lemons. Vegetables Deep-fried vegetables. [place] fries. Any vegetables prepared with added fat. Any vegetables that cause symptoms. For some people, this may include tomatoes and tomato products, chili peppers, onions and garlic, and horseradish. Grains Pastries or quick breads with added fat. Meats and other proteins High-fat meats, such as fatty beef or pork,  hot dogs, ribs, ham, sausage, salami, and bacon. Fried meat or protein, including fried fish and fried chicken. Nuts and nut butters, in large amounts. Dairy Whole milk and chocolate milk. Sour cream. Cream. Ice cream. Cream cheese. Milkshakes. Fats and oils Butter. Margarine. Shortening. Ghee. Beverages Coffee and tea, with or without caffeine. Carbonated beverages. Sodas. Energy drinks. Fruit juice made with acidic fruits, such as orange or grapefruit. Tomato juice. Alcoholic drinks. Sweets and desserts Chocolate and  cocoa. Donuts. Seasonings and condiments Pepper. Peppermint and spearmint. Added salt. Any condiments, herbs, or seasonings that cause symptoms. For some people, this may include curry, hot sauce, or vinegar-based salad dressings. The items listed above may not be a complete list of what you should not eat and drink. Contact a food expert for more options. Questions to ask your doctor Diet and lifestyle changes are often the first steps that are taken to manage symptoms of GERD. If diet and lifestyle changes do not help, talk with your doctor about taking medicines. Where to find more information  International Foundation for Gastrointestinal Disorders: aboutgerd.org Summary  When you have GERD, food and lifestyle choices are very important in easing your symptoms.  Eat small meals often instead of 3 large meals a day. Eat your meals slowly and in a place where you are relaxed.  Avoid bending over or lying down until 2-3 hours after eating.  Limit high-fat foods such as fatty meats or fried foods. This information is not intended to replace advice given to you by your health care provider. Make sure you discuss any questions you have with your health care provider. Document Revised: 11/10/2019 Document Reviewed: 11/10/2019 Elsevier Patient Education  2021 Elsevier Inc. Fluoxetine capsules or tablets (Depression/Mood Disorders) What is this medicine? FLUOXETINE (floo OX e teen) belongs to a class of drugs known as selective serotonin reuptake inhibitors (SSRIs). It helps to treat mood problems such as depression, obsessive compulsive disorder, and panic attacks. It can also treat certain eating disorders. This medicine may be used for other purposes; ask your health care provider or pharmacist if you have questions. COMMON BRAND NAME(S): Prozac What should I tell my health care provider before I take this medicine? They need to know if you have any of these conditions:  bipolar disorder  or a family history of bipolar disorder  bleeding disorders  glaucoma  heart disease  liver disease  low levels of sodium in the blood  seizures  suicidal thoughts, plans, or attempt; a previous suicide attempt by you or a family member  take MAOIs like Carbex, Eldepryl, Marplan, Nardil, and Parnate  take medicines that treat or prevent blood clots  thyroid disease  an unusual or allergic reaction to fluoxetine, other medicines, foods, dyes, or preservatives  pregnant or trying to get pregnant  breast-feeding How should I use this medicine? Take this medicine by mouth with a glass of water. Follow the directions on the prescription label. You can take this medicine with or without food. Take your medicine at regular intervals. Do not take it more often than directed. Do not stop taking this medicine suddenly except upon the advice of your doctor. Stopping this medicine too quickly may cause serious side effects or your condition may worsen. A special MedGuide will be given to you by the pharmacist with each prescription and refill. Be sure to read this information carefully each time. Talk to your pediatrician regarding the use of this medicine in children. While this drug may be prescribed  for children as young as 7 years for selected conditions, precautions do apply. Overdosage: If you think you have taken too much of this medicine contact a poison control center or emergency room at once. NOTE: This medicine is only for you. Do not share this medicine with others. What if I miss a dose? If you miss a dose, skip the missed dose and go back to your regular dosing schedule. Do not take double or extra doses. What may interact with this medicine? Do not take this medicine with any of the following medications:  other medicines containing fluoxetine, like Sarafem or Symbyax  cisapride  dronedarone  linezolid  MAOIs like Carbex, Eldepryl, Marplan, Nardil, and  Parnate  methylene blue (injected into a vein)  pimozide  thioridazine This medicine may also interact with the following medications:  alcohol  amphetamines  aspirin and aspirin-like medicines  carbamazepine  certain medicines for depression, anxiety, or psychotic disturbances  certain medicines for migraine headaches like almotriptan, eletriptan, frovatriptan, naratriptan, rizatriptan, sumatriptan, zolmitriptan  digoxin  diuretics  fentanyl  flecainide  furazolidone  isoniazid  lithium  medicines for sleep  medicines that treat or prevent blood clots like warfarin, enoxaparin, and dalteparin  NSAIDs, medicines for pain and inflammation, like ibuprofen or naproxen  other medicines that prolong the QT interval (an abnormal heart rhythm)  phenytoin  procarbazine  propafenone  rasagiline  ritonavir  supplements like St. John's wort, kava kava, valerian  tramadol  tryptophan  vinblastine This list may not describe all possible interactions. Give your health care provider a list of all the medicines, herbs, non-prescription drugs, or dietary supplements you use. Also tell them if you smoke, drink alcohol, or use illegal drugs. Some items may interact with your medicine. What should I watch for while using this medicine? Tell your doctor if your symptoms do not get better or if they get worse. Visit your doctor or health care professional for regular checks on your progress. Because it may take several weeks to see the full effects of this medicine, it is important to continue your treatment as prescribed by your doctor. Patients and their families should watch out for new or worsening thoughts of suicide or depression. Also watch out for sudden changes in feelings such as feeling anxious, agitated, panicky, irritable, hostile, aggressive, impulsive, severely restless, overly excited and hyperactive, or not being able to sleep. If this happens, especially at  the beginning of treatment or after a change in dose, call your health care professional. Bonita Quin may get drowsy or dizzy. Do not drive, use machinery, or do anything that needs mental alertness until you know how this medicine affects you. Do not stand or sit up quickly, especially if you are an older patient. This reduces the risk of dizzy or fainting spells. Alcohol may interfere with the effect of this medicine. Avoid alcoholic drinks. Your mouth may get dry. Chewing sugarless gum or sucking hard candy, and drinking plenty of water may help. Contact your doctor if the problem does not go away or is severe. This medicine may affect blood sugar levels. If you have diabetes, check with your doctor or health care professional before you change your diet or the dose of your diabetic medicine. What side effects may I notice from receiving this medicine? Side effects that you should report to your doctor or health care professional as soon as possible:  allergic reactions like skin rash, itching or hives, swelling of the face, lips, or tongue  anxious  black, tarry stools  breathing problems  changes in vision  confusion  elevated mood, decreased need for sleep, racing thoughts, impulsive behavior  eye pain  fast, irregular heartbeat  feeling faint or lightheaded, falls  feeling agitated, angry, or irritable  hallucination, loss of contact with reality  loss of balance or coordination  loss of memory  painful or prolonged erections  restlessness, pacing, inability to keep still  seizures  stiff muscles  suicidal thoughts or other mood changes  trouble sleeping  unusual bleeding or bruising  unusually weak or tired  vomiting Side effects that usually do not require medical attention (report to your doctor or health care professional if they continue or are bothersome):  change in appetite or weight  change in sex drive or performance  diarrhea  dry  mouth  headache  increased sweating  nausea  tremors This list may not describe all possible side effects. Call your doctor for medical advice about side effects. You may report side effects to FDA at 1-800-FDA-1088. Where should I keep my medicine? Keep out of the reach of children. Store at room temperature between 15 and 30 degrees C (59 and 86 degrees F). Throw away any unused medicine after the expiration date. NOTE: This sheet is a summary. It may not cover all possible information. If you have questions about this medicine, talk to your doctor, pharmacist, or health care provider.  2021 Elsevier/Gold Standard (2017-12-20 11:56:53)

## 2020-07-21 DIAGNOSIS — M9901 Segmental and somatic dysfunction of cervical region: Secondary | ICD-10-CM | POA: Diagnosis not present

## 2020-07-21 DIAGNOSIS — M436 Torticollis: Secondary | ICD-10-CM | POA: Diagnosis not present

## 2020-07-21 DIAGNOSIS — M542 Cervicalgia: Secondary | ICD-10-CM | POA: Diagnosis not present

## 2020-07-21 DIAGNOSIS — G540 Brachial plexus disorders: Secondary | ICD-10-CM | POA: Diagnosis not present

## 2020-08-06 DIAGNOSIS — E785 Hyperlipidemia, unspecified: Secondary | ICD-10-CM | POA: Diagnosis not present

## 2020-08-06 DIAGNOSIS — F419 Anxiety disorder, unspecified: Secondary | ICD-10-CM | POA: Diagnosis not present

## 2020-08-06 DIAGNOSIS — E559 Vitamin D deficiency, unspecified: Secondary | ICD-10-CM | POA: Diagnosis not present

## 2020-08-06 DIAGNOSIS — I10 Essential (primary) hypertension: Secondary | ICD-10-CM | POA: Diagnosis not present

## 2020-08-07 LAB — COMPREHENSIVE METABOLIC PANEL
ALT: 31 IU/L (ref 0–44)
AST: 23 IU/L (ref 0–40)
Albumin/Globulin Ratio: 1.4 (ref 1.2–2.2)
Albumin: 4.6 g/dL (ref 4.0–5.0)
Alkaline Phosphatase: 103 IU/L (ref 44–121)
BUN/Creatinine Ratio: 19 (ref 9–20)
BUN: 20 mg/dL (ref 6–20)
Bilirubin Total: 0.3 mg/dL (ref 0.0–1.2)
CO2: 27 mmol/L (ref 20–29)
Calcium: 9.4 mg/dL (ref 8.7–10.2)
Chloride: 93 mmol/L — ABNORMAL LOW (ref 96–106)
Creatinine, Ser: 1.05 mg/dL (ref 0.76–1.27)
Globulin, Total: 3.3 g/dL (ref 1.5–4.5)
Glucose: 94 mg/dL (ref 65–99)
Potassium: 3.2 mmol/L — ABNORMAL LOW (ref 3.5–5.2)
Sodium: 136 mmol/L (ref 134–144)
Total Protein: 7.9 g/dL (ref 6.0–8.5)
eGFR: 95 mL/min/{1.73_m2} (ref >59)

## 2020-08-07 LAB — CBC WITH DIFFERENTIAL/PLATELET
Basophils Absolute: 0.1 10*3/uL (ref 0.0–0.2)
Basos: 1 %
EOS (ABSOLUTE): 0.3 10*3/uL (ref 0.0–0.4)
Eos: 3 %
Hematocrit: 43.6 % (ref 37.5–51.0)
Hemoglobin: 14.7 g/dL (ref 13.0–17.7)
Immature Grans (Abs): 0 10*3/uL (ref 0.0–0.1)
Immature Granulocytes: 0 %
Lymphocytes Absolute: 2.5 10*3/uL (ref 0.7–3.1)
Lymphs: 27 %
MCH: 28.4 pg (ref 26.6–33.0)
MCHC: 33.7 g/dL (ref 31.5–35.7)
MCV: 84 fL (ref 79–97)
Monocytes Absolute: 0.9 10*3/uL (ref 0.1–0.9)
Monocytes: 10 %
Neutrophils Absolute: 5.5 10*3/uL (ref 1.4–7.0)
Neutrophils: 59 %
Platelets: 416 10*3/uL (ref 150–450)
RBC: 5.17 x10E6/uL (ref 4.14–5.80)
RDW: 12.8 % (ref 11.6–15.4)
WBC: 9.3 10*3/uL (ref 3.4–10.8)

## 2020-08-07 LAB — LIPID PANEL W/O CHOL/HDL RATIO
Cholesterol, Total: 198 mg/dL (ref 100–199)
HDL: 40 mg/dL (ref >39)
LDL Chol Calc (NIH): 141 mg/dL — ABNORMAL HIGH (ref 0–99)
Triglycerides: 92 mg/dL (ref 0–149)
VLDL Cholesterol Cal: 17 mg/dL (ref 5–40)

## 2020-08-07 LAB — TSH: TSH: 0.785 u[IU]/mL (ref 0.450–4.500)

## 2020-08-07 LAB — VITAMIN D 25 HYDROXY (VIT D DEFICIENCY, FRACTURES): Vit D, 25-Hydroxy: 32.6 ng/mL (ref 30.0–100.0)

## 2020-08-09 ENCOUNTER — Ambulatory Visit: Payer: Self-pay | Admitting: *Deleted

## 2020-08-09 ENCOUNTER — Other Ambulatory Visit: Payer: Self-pay | Admitting: Adult Health

## 2020-08-09 DIAGNOSIS — E559 Vitamin D deficiency, unspecified: Secondary | ICD-10-CM

## 2020-08-09 DIAGNOSIS — E876 Hypokalemia: Secondary | ICD-10-CM

## 2020-08-09 MED ORDER — VITAMIN D (ERGOCALCIFEROL) 1.25 MG (50000 UNIT) PO CAPS: 50000.0000 [IU] | ORAL_CAPSULE | ORAL | 0 refills | Status: DC

## 2020-08-09 NOTE — Progress Notes (Signed)
Meds ordered this encounter  Medications  . Vitamin D, Ergocalciferol, (DRISDOL) 1.25 MG (50000 UNIT) CAPS capsule    Sig: Take 1 capsule (50,000 Units total) by mouth every 7 (seven) days. (taking one tablet per week) labs 1- 2 weeks after completing medication.    Dispense:  12 capsule    Refill:  0   Orders Placed This Encounter  Procedures  . VITAMIN D 25 Hydroxy (Vit-D Deficiency, Fractures)

## 2020-08-09 NOTE — Telephone Encounter (Signed)
Per inititial encounter, "Pt saw his lab results on MyChart and wanted to go over them for further clarification with a nurse"; pt given results and recommendations per Antonietta Jewel, "Vitamin D low end normal now, will renew his prescription vitamin D and recheck per prescription directions. CBC ok. CMP slightly low potassium, increase potassium rich foods, and ok to do low sugar electrolyte drink. CMP ok. Tsh ok. Total cholesterol and LDL elevated. Discuss lifestyle modification with patient e.g. increase exercise, fiber, fruits, vegetables, lean meat, and omega 3/fish intake and decrease saturated fat. If patient following strict diet and exercise program already please schedule follow up appointment with primary care physician. Recommend rechecking CMP and TSH in 3 months"; the pt verbalized understanding and says he will call back to schedule appt; will route to office for notification.  Reason for Disposition . General information question, no triage required and triager able to answer question  Answer Assessment - Initial Assessment Questions 1. REASON FOR CALL or QUESTION: "What is your reason for calling today?" or "How can I best help you?" or "What question do you have that I can help answer?"     Lab results  Protocols used: INFORMATION ONLY CALL - NO TRIAGE-A-AH

## 2020-08-09 NOTE — Progress Notes (Signed)
Vitamin D low end normal now, will renew his prescription vitamin D and recheck per prescription directions.  CBC ok.  CMP slightly low potassium, increase potassium rich foods, and ok to do low sugar electrolyte drink.  CMP ok.  Tsh ok.  Total cholesterol and LDL elevated.  Discuss lifestyle modification with patient e.g. increase exercise, fiber, fruits, vegetables, lean meat, and omega 3/fish intake and decrease saturated fat.  If patient following strict diet and exercise program already please schedule follow up appointment with primary care physician  Recommend rechecking CMP and TSH in 3 months.

## 2020-08-09 NOTE — Progress Notes (Signed)
Orders Placed This Encounter  Procedures  . Comprehensive Metabolic Panel (CMET)  . Thyroid Panel With TSH  . VITAMIN D 25 Hydroxy (Vit-D Deficiency, Fractures)

## 2020-08-10 ENCOUNTER — Encounter: Payer: Self-pay | Admitting: Adult Health

## 2020-08-20 ENCOUNTER — Emergency Department
Admission: EM | Admit: 2020-08-20 | Discharge: 2020-08-20 | Disposition: A | Discharge status: Home / Self Care | Payer: BC Managed Care – PPO | Attending: Emergency Medicine | Admitting: Emergency Medicine

## 2020-08-20 ENCOUNTER — Encounter: Payer: Self-pay | Admitting: Emergency Medicine

## 2020-08-20 ENCOUNTER — Other Ambulatory Visit: Payer: Self-pay

## 2020-08-20 DIAGNOSIS — Z79899 Other long term (current) drug therapy: Secondary | ICD-10-CM | POA: Insufficient documentation

## 2020-08-20 DIAGNOSIS — Z87891 Personal history of nicotine dependence: Secondary | ICD-10-CM | POA: Insufficient documentation

## 2020-08-20 DIAGNOSIS — I10 Essential (primary) hypertension: Secondary | ICD-10-CM | POA: Insufficient documentation

## 2020-08-20 DIAGNOSIS — S39012A Strain of muscle, fascia and tendon of lower back, initial encounter: Secondary | ICD-10-CM | POA: Diagnosis not present

## 2020-08-20 DIAGNOSIS — S29012A Strain of muscle and tendon of back wall of thorax, initial encounter: Secondary | ICD-10-CM | POA: Insufficient documentation

## 2020-08-20 DIAGNOSIS — W240XXA Contact with lifting devices, not elsewhere classified, initial encounter: Secondary | ICD-10-CM | POA: Insufficient documentation

## 2020-08-20 MED ORDER — LIDOCAINE 5 % EX PTCH: 1.0000 | MEDICATED_PATCH | Freq: Two times a day (BID) | CUTANEOUS | 0 refills | Status: DC

## 2020-08-20 MED ORDER — NAPROXEN 500 MG PO TABS: 500.0000 mg | ORAL_TABLET | Freq: Two times a day (BID) | ORAL | 0 refills | Status: DC

## 2020-08-20 MED ORDER — DIAZEPAM 5 MG PO TABS: 5.0000 mg | ORAL_TABLET | Freq: Every evening | ORAL | 0 refills | Status: AC | PRN

## 2020-08-20 NOTE — ED Provider Notes (Addendum)
[facility] Emergency Department Provider Note  ____________________________________________  Time seen: Approximately 4:45 PM  I have reviewed the triage vital signs and the nursing notes.   HISTORY  Chief Complaint Chest Pain and Back Pain    HPI Nathan Nelson is a 35 y.o. male with a history of anxiety, hypertension who comes ED complaining of right sided mid back pain going on for the last 2 weeks.  This is a somewhat different history than what was provided to triage.  He denies shortness of breath or pleuritic pain.  Denies any substantial chest pain.  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   Pain is moderate intensity, feels like an ache, not sharp.  Not exertional.  Worse with movement.  No alleviating factors.  Denies any recent travel, hospitalizations or surgeries.  No history of DVT or PE.     Past Medical History:  Diagnosis Date  . Anxiety   . Hypertension      Patient Active Problem List   Diagnosis Date Noted  . Gastroesophageal reflux disease 07/16/2020  . No-show for appointment 07/07/2020  . Hyperlipidemia 04/06/2020  . Body mass index (BMI) of 38.0-38.9 in adult 04/06/2020  . Muscle spasm 04/06/2020  . Lymphocytosis 03/24/2020  . Screening for blood or protein in urine 03/19/2020  . Rib pain on left side 03/19/2020  . Vitamin D deficiency 03/19/2020  . Cervical pain 03/19/2020  . Strain of neck muscle 02/27/2019  . Pain in joint of right shoulder 09/19/2018  . Anxiety 12/06/2015  . Right shoulder injury 09/28/2015  . Costochondritis 06/11/2014  . URI (upper respiratory infection) 05/30/2012  . Hypertension      Past Surgical History:  Procedure Laterality Date  . KNEE SURGERY       Prior to Admission medications   Medication Sig Start Date End Date Taking? Authorizing Provider  diazepam (VALIUM) 5 MG tablet Take 1 tablet (5 mg total)  by mouth at bedtime as needed for up to 7 days for muscle spasms. 08/20/20 08/27/20 Yes Sharman Cheek, MD  lidocaine (LIDODERM) 5 % Place 1 patch onto the skin every 12 (twelve) hours. Remove & Discard patch within 12 hours or as directed by MD 08/20/20  Yes Sharman Cheek, MD  naproxen (NAPROSYN) 500 MG tablet Take 1 tablet (500 mg total) by mouth 2 (two) times daily with a meal. 08/20/20  Yes Sharman Cheek, MD  amLODipine (NORVASC) 10 MG tablet Take 1 tablet (10 mg total) by mouth daily. 07/02/20   Flinchum, Eula Fried, FNP  chlorthalidone (HYGROTON) 50 MG tablet Take 1 tablet (50 mg total) by mouth daily. 07/02/20   Flinchum, Eula Fried, FNP  FLUoxetine (PROZAC) 10 MG capsule Take 5 capsules (50 mg total) by mouth every morning. 07/16/20   Flinchum, Eula Fried, FNP  hydrochlorothiazide (HYDRODIURIL) 25 MG tablet Take 1 tablet by mouth daily. 09/01/16   [provider]  hydrOXYzine (ATARAX/VISTARIL) 10 MG tablet Take 1 tablet (10 mg total) by mouth 3 (three) times daily as needed. 07/16/20   Flinchum, Eula Fried, FNP  omeprazole (PRILOSEC) 40 MG capsule Take 1 capsule (40 mg total) by mouth daily. 07/16/20   Flinchum, Eula Fried, FNP  potassium chloride SA (KLOR-CON) 20 MEQ tablet Take 20 mEq by mouth 2 (two) times daily. 11/20/19   [provider]  Vitamin D, Ergocalciferol, (DRISDOL) 1.25 MG (50000 UNIT) CAPS capsule Take 1 capsule (  50,000 Units total) by mouth every 7 (seven) days. (taking one tablet per week) labs 1- 2 weeks after completing medication. 08/09/20   Flinchum, Eula Fried, FNP     Allergies Lisinopril   Family History  Problem Relation Age of Onset  . Hypertension Mother   . Diabetes Father   . Hypertension Father     Social History Social History   Tobacco Use  . Smoking status: Former Smoker    Quit date: 03/02/2014    Years since quitting: 6.4  . Smokeless tobacco: Never Used  Substance Use Topics  . Alcohol use: Yes    Alcohol/week: 0.0 standard  drinks    Comment: rarely  . Drug use: No    Review of Systems  Constitutional:   No fever or chills.  ENT:   No sore throat. No rhinorrhea. Cardiovascular:   No chest pain or syncope. Respiratory:   No dyspnea or cough. Gastrointestinal:   Negative for abdominal pain, vomiting and diarrhea.  Musculoskeletal: Positive upper back pain as above All other systems reviewed and are negative except as documented above in ROS and HPI.  ____________________________________________   PHYSICAL EXAM:  VITAL SIGNS: ED Triage Vitals  Enc Vitals Group     BP 08/20/20 1527 (!) 136/92     Pulse Rate 08/20/20 1527 79     Resp 08/20/20 1527 20     Temp 08/20/20 1527 98.3 F (36.8 C)     Temp Source 08/20/20 1527 Oral     SpO2 08/20/20 1527 98 %     Weight 08/20/20 1527 217 lb (98.4 kg)     Height 08/20/20 1527 5\' 4"  (1.626 m)     Head Circumference --      Peak Flow --      Pain Score 08/20/20 1529 8     Pain Loc --      Pain Edu? --      Excl. in GC? --     Vital signs reviewed, nursing assessments reviewed.   Constitutional:   Alert and oriented. Non-toxic appearance. Eyes:   Conjunctivae are normal. EOMI. PERRL. ENT      Head:   Normocephalic and atraumatic.      Nose:   Wearing a mask.      Mouth/Throat:   Wearing a mask.      Neck:   No meningismus. Full ROM. Hematological/Lymphatic/Immunilogical:   No cervical lymphadenopathy. Cardiovascular:   RRR. Symmetric bilateral radial and DP pulses.  No murmurs. Cap refill less than 2 seconds. Respiratory:   Normal respiratory effort without tachypnea/retractions. Breath sounds are clear and equal bilaterally. No wheezes/rales/rhonchi. Gastrointestinal:   Soft and nontender. Non distended. There is no CVA tenderness.  No rebound, rigidity, or guarding.  Musculoskeletal:   Normal range of motion in all extremities. No joint effusions.  There is muscle tension and tenderness reproducing his pain in the right trapezius and right lower  rhomboids.  Pain is also reproduced with stress of his posterior shoulder adductor muscles against resistance Neurologic:   Normal speech and language.  Motor grossly intact. No acute focal neurologic deficits are appreciated.  Skin:    Skin is warm, dry and intact. No rash noted.  No petechiae, purpura, or bullae.  ____________________________________________    LABS (pertinent positives/negatives) (all labs ordered are listed, but only abnormal results are displayed) Labs Reviewed - No data to display ____________________________________________   EKG  Interpreted by me Sinus rhythm rate of 74, rightward axis.  Normal intervals.  Normal QRS and ST segments.  No significant T wave abnormality.  There are slight inferior Q waves.  Overall EKG is unchanged compared to previous EKG in 2018.  ____________________________________________    RADIOLOGY  No results found.  ____________________________________________   PROCEDURES Procedures  ____________________________________________    CLINICAL IMPRESSION / ASSESSMENT AND PLAN / ED COURSE  Medications ordered in the ED: Medications - No data to display  Pertinent labs & imaging results that were available during my care of the patient were reviewed by me and considered in my medical decision making (see chart for details).  Nathan Nelson was evaluated in Emergency Department on 08/20/2020 for the symptoms described in the history of present illness. He was evaluated in the context of the global COVID-19 pandemic, which necessitated consideration that the patient might be at risk for infection with the SARS-CoV-2 virus that causes COVID-19. Institutional protocols and algorithms that pertain to the evaluation of patients at risk for COVID-19 are in a state of rapid change based on information released by regulatory bodies including the CDC and federal and state organizations. These policies and algorithms were followed during  the patient's care in the ED.   Patient presents with right upper back pain which is clearly musculoskeletal on exam. Considering the patient's symptoms, medical history, and physical examination today, I have low suspicion for ACS, PE, TAD, pneumothorax, carditis, mediastinitis, pneumonia, CHF, or sepsis.  EKG somewhat borderline for a right heart strain pattern however this appears to be chronic morphology for the patient.  He is PERC negative  Recommend NSAIDs, heat therapy, range of motion exercises, Lidoderm patch, work note provided.  Limited prescription for Valium to use as muscle relaxant at night.      ____________________________________________   FINAL CLINICAL IMPRESSION(S) / ED DIAGNOSES    Final diagnoses:  Upper back strain, initial encounter     ED Discharge Orders         Ordered    diazepam (VALIUM) 5 MG tablet  At bedtime PRN        08/20/20 1643    naproxen (NAPROSYN) 500 MG tablet  2 times daily with meals        08/20/20 1643    lidocaine (LIDODERM) 5 %  Every 12 hours        08/20/20 1643          Portions of this note were generated with dragon dictation software. Dictation errors may occur despite best attempts at proofreading.   Sharman Cheek, MD 08/20/20 1701    Sharman Cheek, MD 08/20/20 [phone]

## 2020-08-20 NOTE — ED Notes (Signed)
Patient provided pillow, remote to TV. Patient placed on cardiac monitor.

## 2020-08-20 NOTE — ED Triage Notes (Signed)
Pt c/o mid upper back pain that radiates through to his chest. Pt states has been ongoing x several weeks.

## 2020-08-20 NOTE — ED Notes (Signed)
Patient and visitor deny needs.

## 2020-08-20 NOTE — ED Notes (Signed)
Patient is resting comfortably. 

## 2020-08-20 NOTE — ED Notes (Signed)
ED Provider at bedside. 

## 2020-08-20 NOTE — ED Notes (Signed)
Patient reports pain to the back, radiating through to the right ribs and sternum x months. Patient reports being seen by the chiropractor, who told him, she thought the chest pain was coming from spinal issues. Patient denies SOB, nausea, vomiting, dizziness, or syncope. Patient reports the pain in his back is so significant he can't sleep. Patient is alert, oriented x4, with NADN.

## 2020-08-26 ENCOUNTER — Encounter: Payer: Self-pay | Admitting: Adult Health

## 2020-08-28 ENCOUNTER — Encounter: Payer: Self-pay | Admitting: Adult Health

## 2020-08-28 ENCOUNTER — Other Ambulatory Visit: Payer: Self-pay | Admitting: Adult Health

## 2020-08-28 DIAGNOSIS — F419 Anxiety disorder, unspecified: Secondary | ICD-10-CM

## 2020-08-28 NOTE — Telephone Encounter (Signed)
Requested Prescriptions  Pending Prescriptions Disp Refills  . FLUoxetine (PROZAC) 10 MG capsule [Pharmacy Med Name: FLUoxetine HCl 10 MG Oral Capsule] 150 capsule 0    Sig: TAKE 5 CAPSULES BY MOUTH IN THE MORNING     Psychiatry:  Antidepressants - SSRI Passed - 08/28/2020  1:17 PM      Passed - Valid encounter within last 6 months    Recent Outpatient Visits          1 month ago Anxiety   [facility] Flinchum, Eula Fried, FNP   1 month ago No-show for appointment   [facility] Flinchum, Eula Fried, FNP   4 months ago Body mass index (BMI) of 38.0-38.9 in adult   [facility] Flinchum, Eula Fried, FNP   5 months ago Hypertension, unspecified type   [facility] Flinchum, Eula Fried, FNP

## 2020-10-04 ENCOUNTER — Encounter: Payer: Self-pay | Admitting: Adult Health

## 2020-10-05 ENCOUNTER — Other Ambulatory Visit: Payer: Self-pay

## 2020-10-05 DIAGNOSIS — F419 Anxiety disorder, unspecified: Secondary | ICD-10-CM

## 2020-10-05 DIAGNOSIS — I10 Essential (primary) hypertension: Secondary | ICD-10-CM

## 2020-10-05 MED ORDER — FLUOXETINE HCL 10 MG PO CAPS: ORAL_CAPSULE | ORAL | 0 refills | Status: DC

## 2020-10-05 MED ORDER — CHLORTHALIDONE 50 MG PO TABS: 50.0000 mg | ORAL_TABLET | Freq: Every day | ORAL | 0 refills | Status: DC

## 2020-10-05 MED ORDER — AMLODIPINE BESYLATE 10 MG PO TABS: 10.0000 mg | ORAL_TABLET | Freq: Every day | ORAL | 0 refills | Status: DC

## 2020-10-05 NOTE — Telephone Encounter (Signed)
Attempted to call patient to schedule for BP follow up. Voicemail box was not set up. Medication has been refilled.

## 2020-11-14 ENCOUNTER — Other Ambulatory Visit: Payer: Self-pay | Admitting: Adult Health

## 2020-11-14 DIAGNOSIS — I10 Essential (primary) hypertension: Secondary | ICD-10-CM

## 2020-11-16 MED ORDER — AMLODIPINE BESYLATE 10 MG PO TABS: 10.0000 mg | ORAL_TABLET | Freq: Every day | ORAL | 0 refills | Status: DC

## 2020-11-16 MED ORDER — CHLORTHALIDONE 50 MG PO TABS: 50.0000 mg | ORAL_TABLET | Freq: Every day | ORAL | 0 refills | Status: DC

## 2020-11-22 ENCOUNTER — Other Ambulatory Visit: Payer: Self-pay | Admitting: Adult Health

## 2020-11-22 DIAGNOSIS — F419 Anxiety disorder, unspecified: Secondary | ICD-10-CM

## 2020-11-23 MED ORDER — FLUOXETINE HCL 10 MG PO CAPS: ORAL_CAPSULE | ORAL | 0 refills | Status: DC

## 2020-12-13 ENCOUNTER — Encounter: Payer: Self-pay | Admitting: Adult Health

## 2020-12-25 ENCOUNTER — Encounter: Payer: Self-pay | Admitting: Adult Health

## 2020-12-25 DIAGNOSIS — I10 Essential (primary) hypertension: Secondary | ICD-10-CM

## 2020-12-27 MED ORDER — CHLORTHALIDONE 50 MG PO TABS: 50.0000 mg | ORAL_TABLET | Freq: Every day | ORAL | 0 refills | Status: DC

## 2020-12-27 MED ORDER — AMLODIPINE BESYLATE 10 MG PO TABS: 10.0000 mg | ORAL_TABLET | Freq: Every day | ORAL | 0 refills | Status: DC

## 2020-12-28 ENCOUNTER — Other Ambulatory Visit: Payer: Self-pay

## 2020-12-28 MED ORDER — CHLORTHALIDONE 50 MG PO TABS
ORAL_TABLET | ORAL | 0 refills | Status: DC
  Filled 2020-12-28: qty 30, 30d supply, fill #0

## 2020-12-28 MED ORDER — AMLODIPINE BESYLATE 10 MG PO TABS
ORAL_TABLET | ORAL | 0 refills | Status: DC
  Filled 2020-12-28: qty 30, 30d supply, fill #0

## 2021-01-26 ENCOUNTER — Encounter: Payer: Self-pay | Admitting: Adult Health

## 2021-01-31 ENCOUNTER — Other Ambulatory Visit: Payer: Self-pay

## 2021-01-31 MED ORDER — AMLODIPINE BESYLATE 10 MG PO TABS
ORAL_TABLET | ORAL | 0 refills | Status: DC
  Filled 2021-02-01: qty 30, 30d supply, fill #0

## 2021-02-01 ENCOUNTER — Other Ambulatory Visit: Payer: Self-pay

## 2021-02-01 ENCOUNTER — Other Ambulatory Visit: Payer: Self-pay | Admitting: Adult Health

## 2021-02-01 DIAGNOSIS — I10 Essential (primary) hypertension: Secondary | ICD-10-CM

## 2021-02-01 MED ORDER — CHLORTHALIDONE 50 MG PO TABS
50.0000 mg | ORAL_TABLET | Freq: Every day | ORAL | 0 refills | Status: DC
  Filled 2021-02-01: qty 30, 30d supply, fill #0

## 2021-03-07 ENCOUNTER — Encounter: Payer: Self-pay | Admitting: Adult Health

## 2021-03-07 ENCOUNTER — Other Ambulatory Visit: Payer: Self-pay | Admitting: Internal Medicine

## 2021-03-07 ENCOUNTER — Other Ambulatory Visit: Payer: Self-pay

## 2021-03-07 ENCOUNTER — Other Ambulatory Visit: Payer: Self-pay | Admitting: Adult Health

## 2021-03-07 DIAGNOSIS — I10 Essential (primary) hypertension: Secondary | ICD-10-CM

## 2021-03-07 MED FILL — Amlodipine Besylate Tab 10 MG (Base Equivalent): ORAL | 30 days supply | Qty: 30 | Fill #0 | Status: CN

## 2021-03-07 MED FILL — Chlorthalidone Tab 50 MG: ORAL | 30 days supply | Qty: 30 | Fill #0 | Status: CN

## 2021-03-09 ENCOUNTER — Other Ambulatory Visit: Payer: Self-pay

## 2021-03-09 ENCOUNTER — Other Ambulatory Visit: Payer: Self-pay | Admitting: Internal Medicine

## 2021-03-09 MED ORDER — AMLODIPINE BESYLATE 10 MG PO TABS: ORAL_TABLET | ORAL | 0 refills | Status: DC

## 2021-03-09 MED ORDER — CHLORTHALIDONE 50 MG PO TABS: 50.0000 mg | ORAL_TABLET | Freq: Every day | ORAL | 0 refills | Status: DC

## 2021-03-11 ENCOUNTER — Ambulatory Visit (INDEPENDENT_AMBULATORY_CARE_PROVIDER_SITE_OTHER): Payer: Self-pay | Admitting: Family Medicine

## 2021-03-11 ENCOUNTER — Other Ambulatory Visit: Payer: Self-pay

## 2021-03-11 ENCOUNTER — Encounter: Payer: Self-pay | Admitting: Family Medicine

## 2021-03-11 VITALS — BP 142/96 | HR 97 | Temp 98.4°F

## 2021-03-11 DIAGNOSIS — E559 Vitamin D deficiency, unspecified: Secondary | ICD-10-CM

## 2021-03-11 DIAGNOSIS — F419 Anxiety disorder, unspecified: Secondary | ICD-10-CM

## 2021-03-11 DIAGNOSIS — E785 Hyperlipidemia, unspecified: Secondary | ICD-10-CM

## 2021-03-11 DIAGNOSIS — I10 Essential (primary) hypertension: Secondary | ICD-10-CM

## 2021-03-11 NOTE — Progress Notes (Signed)
Established patient visit   Patient: Nathan Nelson   DOB: 1985-09-13   35 y.o. Male  MRN: [id number] Visit Date: 03/11/2021  Today's healthcare provider: Lelon Huh, MD   Chief Complaint  Patient presents with   Hypertension   Anxiety   Hyperlipidemia   Subjective    HPI  Hypertension, follow-up  BP Readings from Last 3 Encounters:  03/11/21 (!) 142/96  08/20/20 120/80  07/16/20 122/90   Wt Readings from Last 3 Encounters:  08/20/20 217 lb (98.4 kg)  07/16/20 217 lb (98.4 kg)  06/02/20 222 lb 9.6 oz (101 kg)     He was last seen for hypertension 7 months ago.  BP at that visit was 122/90. Management since that visit includes; on amlodipine, chlorthalidone, and HCTZ. He reports fair compliance with treatment. (Has not been taking HCTZ) He is not having side effects.  He is exercising. He is not adherent to low salt diet.   Outside blood pressures are checked; per patient report his blood pressure have been "good".  He does not smoke.  Use of agents associated with hypertension: none.   --------------------------------------------------------------------------------------------------- Lipid/Cholesterol, follow-up  Last Lipid Panel: Lab Results  Component Value Date   CHOL 198 08/06/2020   LDLCALC 141 (H) 08/06/2020   HDL 40 08/06/2020   TRIG 92 08/06/2020    He was last seen for this 7 months ago.  Management since that visit includes; labs checked showing-Total cholesterol and LDL elevated. Advised  lifestyle modification, diet and exercise.  He reports good compliance with treatment. He is not having side effects.   Symptoms: No appetite changes No foot ulcerations  No chest pain No chest pressure/discomfort  No dyspnea No orthopnea  No fatigue No lower extremity edema  No palpitations No paroxysmal nocturnal dyspnea  No nausea No numbness or tingling of extremity  No polydipsia No polyuria  No speech difficulty No syncope   He is  following a Regular diet. Current exercise: weightlifting  Last metabolic panel Lab Results  Component Value Date   GLUCOSE 94 08/06/2020   NA 136 08/06/2020   K 3.2 (L) 08/06/2020   BUN 20 08/06/2020   CREATININE 1.05 08/06/2020   EGFR 95 08/06/2020   GFRNONAA 88 04/06/2020   CALCIUM 9.4 08/06/2020   AST 23 08/06/2020   ALT 31 08/06/2020   The ASCVD Risk score (Arnett DK, et al., 2019) failed to calculate for the following reasons:   The 2019 ASCVD risk score is only valid for ages 66 to 32  ---------------------------------------------------------------------------------------------------  Anxiety, Follow-up  He was last seen for anxiety 7 months ago. Changes made at last visit include decreasing Prozac from 60 mg daily to 20m daily. Also decreased dose of Atarax. Patient was advised to try taking Prozac in the morning since he felt  wired at night. Offered refferal to psychiatry, but patient declines at that time.   He reports poor compliance with treatment. Patient stopped taking his medications because he didn't like how it made him feel. Patient has tried praying and meditation as coping mechanisms to relieve stress.  He reports poor tolerance of treatment. He is not having side effects.   He feels his anxiety is mild and Improved since last visit.  Symptoms: No chest pain No difficulty concentrating  No dizziness No fatigue  No feelings of losing control No insomnia  No irritable No palpitations  No panic attacks No racing thoughts  No shortness of breath No sweating  No tremors/shakes    GAD-7 Results GAD-7 Generalized Anxiety Disorder Screening Tool 04/06/2020  1. Feeling Nervous, Anxious, or on Edge 3  2. Not Being Able to Stop or Control Worrying 3  3. Worrying Too Much About Different Things 1  4. Trouble Relaxing 0  5. Being So Restless it's Hard To Sit Still 1  6. Becoming Easily Annoyed or Irritable 3  7. Feeling Afraid As If Something Awful Might  Happen 1  Total GAD-7 Score 12  Difficulty At Work, Home, or Getting  Along With Others? Somewhat difficult    PHQ-9 Scores PHQ9 SCORE ONLY 03/11/2021 03/19/2020  PHQ-9 Total Score 3 11    ---------------------------------------------------------------------------------------------------      Medications: Outpatient Medications Prior to Visit  Medication Sig Note   amLODipine (NORVASC) 10 MG tablet Take 1 tablet by mouth once daily    chlorthalidone (HYGROTON) 50 MG tablet Take 1 tablet (50 mg total) by mouth daily.    FLUoxetine (PROZAC) 10 MG capsule TAKE 5 CAPSULES BY MOUTH IN THE MORNING (Patient not taking: Reported on 03/11/2021)    hydrOXYzine (ATARAX/VISTARIL) 10 MG tablet Take 1 tablet (10 mg total) by mouth 3 (three) times daily as needed. (Patient not taking: Reported on 03/11/2021)    lidocaine (LIDODERM) 5 % Place 1 patch onto the skin every 12 (twelve) hours. Remove & Discard patch within 12 hours or as directed by MD (Patient not taking: Reported on 03/11/2021)    omeprazole (PRILOSEC) 40 MG capsule Take 1 capsule (40 mg total) by mouth daily. (Patient not taking: Reported on 03/11/2021)    Vitamin D, Ergocalciferol, (DRISDOL) 1.25 MG (50000 UNIT) CAPS capsule Take 1 capsule (50,000 Units total) by mouth every 7 (seven) days. (taking one tablet per week) labs 1- 2 weeks after completing medication. (Patient not taking: Reported on 03/11/2021)    [DISCONTINUED] amLODipine (NORVASC) 10 MG tablet Take 1 tablet (10 mg total) by mouth daily.    [DISCONTINUED] chlorthalidone (HYGROTON) 50 MG tablet Take 1 tablet by mouth once daily    [DISCONTINUED] chlorthalidone (HYGROTON) 50 MG tablet Take 1 tablet (50 mg total) by mouth daily.    [DISCONTINUED] hydrochlorothiazide (HYDRODIURIL) 25 MG tablet Take 1 tablet by mouth daily. 03/11/2021: previously discontinued in 2019   [DISCONTINUED] naproxen (NAPROSYN) 500 MG tablet Take 1 tablet (500 mg total) by mouth 2 (two) times daily with  a meal.    [DISCONTINUED] potassium chloride SA (KLOR-CON) 20 MEQ tablet Take 20 mEq by mouth 2 (two) times daily. 03/11/2021: previusly discontineud in 2021   No facility-administered medications prior to visit.    Review of Systems  Constitutional:  Negative for appetite change, chills and fever.  Respiratory:  Negative for chest tightness, shortness of breath and wheezing.   Cardiovascular:  Negative for chest pain and palpitations.  Gastrointestinal:  Negative for abdominal pain, nausea and vomiting.      Objective    BP (!) 142/96 (BP Location: Right Arm, Patient Position: Sitting)   Pulse 97   Temp 98.4 F (36.9 C) (Oral)   SpO2 100%  {Show previous vital signs (optional):23777}  Physical Exam    General: Appearance:    Obese male in no acute distress  Eyes:    PERRL, conjunctiva/corneas clear, EOM's intact       Lungs:     Clear to auscultation bilaterally, respirations unlabored  Heart:    Normal heart rate. Normal rhythm. No murmurs, rubs, or gallops.    MS:   All extremities are  intact.    Neurologic:   Awake, alert, oriented x 3. No apparent focal neurological defect.         Assessment & Plan     1. Hypertension, unspecified type Fairly well controlled on current medications.  - Renal function panel  2. Vitamin D deficiency Has finished high dose vitamin D supplements and not currently taking any supplemental vitamin D  - VITAMIN D 25 Hydroxy (Vit-D Deficiency, Fractures)  3. Hyperlipidemia, unspecified hyperlipidemia type Diet controlled.  - Lipid panel  4. Anxiety Did not tolerate fluoxetine. Managing without medications at this time.         The entirety of the information documented in the History of Present Illness, Review of Systems and Physical Exam were personally obtained by me. Portions of this information were initially documented by the CMA and reviewed by me for thoroughness and accuracy.     Lelon Huh, MD  [facility] [phone] (phone) [phone] (fax)  [place]

## 2021-03-12 LAB — VITAMIN D 25 HYDROXY (VIT D DEFICIENCY, FRACTURES): Vit D, 25-Hydroxy: 18.3 ng/mL — ABNORMAL LOW (ref 30.0–100.0)

## 2021-03-12 LAB — RENAL FUNCTION PANEL
Albumin: 4.7 g/dL (ref 4.0–5.0)
BUN/Creatinine Ratio: 11 (ref 9–20)
BUN: 13 mg/dL (ref 6–20)
CO2: 29 mmol/L (ref 20–29)
Calcium: 10.4 mg/dL — ABNORMAL HIGH (ref 8.7–10.2)
Chloride: 96 mmol/L (ref 96–106)
Creatinine, Ser: 1.21 mg/dL (ref 0.76–1.27)
Glucose: 95 mg/dL (ref 70–99)
Phosphorus: 3.6 mg/dL (ref 2.8–4.1)
Potassium: 4 mmol/L (ref 3.5–5.2)
Sodium: 140 mmol/L (ref 134–144)
eGFR: 80 mL/min/{1.73_m2} (ref >59)

## 2021-03-12 LAB — LIPID PANEL
Chol/HDL Ratio: 6.6 ratio — ABNORMAL HIGH (ref 0.0–5.0)
Cholesterol, Total: 239 mg/dL — ABNORMAL HIGH (ref 100–199)
HDL: 36 mg/dL — ABNORMAL LOW (ref >39)
LDL Chol Calc (NIH): 184 mg/dL — ABNORMAL HIGH (ref 0–99)
Triglycerides: 106 mg/dL (ref 0–149)
VLDL Cholesterol Cal: 19 mg/dL (ref 5–40)

## 2021-03-14 ENCOUNTER — Other Ambulatory Visit: Payer: Self-pay | Admitting: *Deleted

## 2021-03-14 ENCOUNTER — Other Ambulatory Visit: Payer: Self-pay | Admitting: Family Medicine

## 2021-03-14 ENCOUNTER — Telehealth: Payer: Self-pay

## 2021-03-14 DIAGNOSIS — E785 Hyperlipidemia, unspecified: Secondary | ICD-10-CM

## 2021-03-14 MED ORDER — PRAVASTATIN SODIUM 20 MG PO TABS: 20.0000 mg | ORAL_TABLET | Freq: Every day | ORAL | 5 refills | Status: DC

## 2021-03-14 NOTE — Telephone Encounter (Signed)
Copied from CRM [id number]. Topic: General - Other >> Mar 14, 2021  3:40 PM Laural Benes, [place] C wrote: Reason for CRM: pt says that he is returning the office call. In viewing chart, showing pt had labs completed recently. Please assist pt further.

## 2021-03-16 ENCOUNTER — Telehealth: Payer: Self-pay | Admitting: *Deleted

## 2021-03-16 NOTE — Telephone Encounter (Signed)
Reviewed lab results and Dr. Theodis Aguas note with the patient. Routing to the office for prescription pravastatin to be sent in and a follow-up visit with Merita Norton, NP in 3 months for B/P, repeat lipids and vitamin D levels to be ordered per lab results note on 03/14/21. Discussed 07/06/20 at 1:00p would work for the patient if available with Robynn Pane. If not, please call him at # on chart.  Pharmacy on file.

## 2021-03-28 ENCOUNTER — Encounter: Payer: Self-pay | Admitting: Family Medicine

## 2021-03-29 MED ORDER — CYCLOBENZAPRINE HCL 5 MG PO TABS: 5.0000 mg | ORAL_TABLET | Freq: Three times a day (TID) | ORAL | 1 refills | Status: DC | PRN

## 2021-03-30 ENCOUNTER — Encounter: Payer: Self-pay | Admitting: Family Medicine

## 2021-04-04 ENCOUNTER — Emergency Department
Admission: EM | Admit: 2021-04-04 | Discharge: 2021-04-04 | Disposition: A | Discharge status: Home / Self Care | Payer: Medicaid Other | Attending: Emergency Medicine | Admitting: Emergency Medicine

## 2021-04-04 ENCOUNTER — Other Ambulatory Visit: Payer: Self-pay

## 2021-04-04 DIAGNOSIS — Z79899 Other long term (current) drug therapy: Secondary | ICD-10-CM | POA: Insufficient documentation

## 2021-04-04 DIAGNOSIS — I10 Essential (primary) hypertension: Secondary | ICD-10-CM | POA: Insufficient documentation

## 2021-04-04 DIAGNOSIS — J069 Acute upper respiratory infection, unspecified: Secondary | ICD-10-CM | POA: Insufficient documentation

## 2021-04-04 DIAGNOSIS — Z87891 Personal history of nicotine dependence: Secondary | ICD-10-CM | POA: Insufficient documentation

## 2021-04-04 DIAGNOSIS — Z20822 Contact with and (suspected) exposure to covid-19: Secondary | ICD-10-CM | POA: Insufficient documentation

## 2021-04-04 LAB — RESP PANEL BY RT-PCR (FLU A&B, COVID) ARPGX2
Influenza A by PCR: NEGATIVE
Influenza B by PCR: NEGATIVE
SARS Coronavirus 2 by RT PCR: NEGATIVE

## 2021-04-04 NOTE — ED Provider Notes (Signed)
[facility] Emergency Department Provider Note   ____________________________________________    I have reviewed the triage vital signs and the nursing notes.   HISTORY  Chief Complaint Viral symptoms     HPI Nathan Nelson is a 35 y.o. male with history as noted below who presents with complaints of congestion, fatigue, body aches, cough which is been ongoing x3 days.  Positive fever.  Daughter has similar symptoms.  Has not taken COVID test.  Reports coworker that he was working with recently diagnosed with COVID  Past Medical History:  Diagnosis Date   Anxiety    Costochondritis 06/11/2014   Hypertension    Right shoulder injury 09/28/2015    Patient Active Problem List   Diagnosis Date Noted   Gastroesophageal reflux disease 07/16/2020   Hyperlipidemia 04/06/2020   Body mass index (BMI) of 38.0-38.9 in adult 04/06/2020   Lymphocytosis 03/24/2020   Vitamin D deficiency 03/19/2020   Anxiety 12/06/2015   Hypertension     Past Surgical History:  Procedure Laterality Date   KNEE SURGERY      Prior to Admission medications   Medication Sig Start Date End Date Taking? Authorizing Provider  cyclobenzaprine (FLEXERIL) 5 MG tablet Take 1 tablet (5 mg total) by mouth 3 (three) times daily as needed for muscle spasms. 03/29/21   Jacky Kindle, FNP  amLODipine (NORVASC) 10 MG tablet Take 1 tablet by mouth once daily 03/09/21   Flinchum, Eula Fried, FNP  chlorthalidone (HYGROTON) 50 MG tablet Take 1 tablet (50 mg total) by mouth daily. 03/07/21   Sherlene Shams, MD  FLUoxetine (PROZAC) 10 MG capsule TAKE 5 CAPSULES BY MOUTH IN THE MORNING Patient not taking: Reported on 03/11/2021 11/23/20   Flinchum, Eula Fried, FNP  hydrOXYzine (ATARAX/VISTARIL) 10 MG tablet Take 1 tablet (10 mg total) by mouth 3 (three) times daily as needed. Patient not taking: Reported on 03/11/2021 07/16/20   Flinchum, Eula Fried, FNP  lidocaine (LIDODERM) 5 % Place 1 patch  onto the skin every 12 (twelve) hours. Remove & Discard patch within 12 hours or as directed by MD Patient not taking: Reported on 03/11/2021 08/20/20   Sharman Cheek, MD  omeprazole (PRILOSEC) 40 MG capsule Take 1 capsule (40 mg total) by mouth daily. Patient not taking: Reported on 03/11/2021 07/16/20   Flinchum, Eula Fried, FNP  pravastatin (PRAVACHOL) 20 MG tablet Take 1 tablet (20 mg total) by mouth daily. 03/14/21   Malva Limes, MD  Vitamin D, Ergocalciferol, (DRISDOL) 1.25 MG (50000 UNIT) CAPS capsule Take 1 capsule (50,000 Units total) by mouth every 7 (seven) days. (taking one tablet per week) labs 1- 2 weeks after completing medication. Patient not taking: Reported on 03/11/2021 08/09/20   Flinchum, Eula Fried, FNP     Allergies Lisinopril  Family History  Problem Relation Age of Onset   Hypertension Mother    Diabetes Father    Hypertension Father     Social History Social History   Tobacco Use   Smoking status: Former    Types: Cigarettes    Quit date: 03/02/2014    Years since quitting: 7.0   Smokeless tobacco: Never  Substance Use Topics   Alcohol use: Yes    Alcohol/week: 0.0 standard drinks    Comment: rarely   Drug use: No    Review of Systems  Constitutional: Fever  ENT: Congestion   Gastrointestinal: No abdominal pain.  No nausea, no vomiting.   Genitourinary: Negative for dysuria. Musculoskeletal:  Positive myalgias Skin: Negative for rash. Neurological: Negative for headaches     ____________________________________________   PHYSICAL EXAM:  VITAL SIGNS: ED Triage Vitals  Enc Vitals Group     BP 04/04/21 0854 (!) 142/102     Pulse Rate 04/04/21 0854 85     Resp 04/04/21 0854 18     Temp 04/04/21 0854 98.7 F (37.1 C)     Temp Source 04/04/21 0854 Oral     SpO2 04/04/21 0854 96 %     Weight 04/04/21 0857 107.5 kg (237 lb)     Height 04/04/21 0857 1.626 m (5\' 4" )     Head Circumference --      Peak Flow --      Pain Score  04/04/21 0901 0     Pain Loc --      Pain Edu? --      Excl. in GC? --      Constitutional: Alert and oriented. No acute distress. Pleasant and interactive Eyes: Conjunctivae are normal.  Head: Atraumatic. Nose: Positive congestion Mouth/Throat: Mucous membranes are moist.   Cardiovascular: Normal rate, regular rhythm.  Respiratory: Normal respiratory effort.  No retractions.  CTA B Genitourinary: deferred Musculoskeletal: No lower extremity tenderness nor edema.   Neurologic:  Normal speech and language. No gross focal neurologic deficits are appreciated.   Skin:  Skin is warm, dry and intact. No rash noted.   ____________________________________________   LABS (all labs ordered are listed, but only abnormal results are displayed)  Labs Reviewed  RESP PANEL BY RT-PCR (FLU A&B, COVID) [id number]   ____________________________________________  EKG   ____________________________________________  RADIOLOGY   ____________________________________________   PROCEDURES  Procedure(s) performed: No  Procedures   Critical Care performed: No ____________________________________________   INITIAL IMPRESSION / ASSESSMENT AND PLAN / ED COURSE  Pertinent labs & imaging results that were available during my care of the patient were reviewed by me and considered in my medical decision making (see chart for details).   Patient overall well-appearing and in no acute distress, afebrile here, given constellation of symptoms highly suspicious for influenza A versus COVID given that both are prevalent in the community this time.  Will send COVID/flu PCR  Appropriate for discharge with supportive care   ____________________________________________   FINAL CLINICAL IMPRESSION(S) / ED DIAGNOSES  Final diagnoses:  Viral upper respiratory tract infection      NEW MEDICATIONS STARTED DURING THIS VISIT:  Discharge Medication List as of 04/04/2021  9:20 AM       Note:   This document was prepared using Dragon voice recognition software and may include unintentional dictation errors.    04/06/2021, MD 04/04/21 [phone]

## 2021-04-04 NOTE — ED Triage Notes (Signed)
Pt to ED for flu/covid sx since Friday. 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

## 2021-04-08 ENCOUNTER — Other Ambulatory Visit: Payer: Self-pay | Admitting: Adult Health

## 2021-04-08 DIAGNOSIS — I10 Essential (primary) hypertension: Secondary | ICD-10-CM

## 2021-04-10 ENCOUNTER — Other Ambulatory Visit: Payer: Self-pay | Admitting: Adult Health

## 2021-04-10 MED FILL — Chlorthalidone Tab 50 MG: ORAL | 30 days supply | Qty: 30 | Fill #0 | Status: CN

## 2021-04-11 ENCOUNTER — Other Ambulatory Visit: Payer: Self-pay | Admitting: Family Medicine

## 2021-04-11 ENCOUNTER — Other Ambulatory Visit: Payer: Self-pay

## 2021-04-11 ENCOUNTER — Encounter: Payer: Self-pay | Admitting: Family Medicine

## 2021-04-11 DIAGNOSIS — I10 Essential (primary) hypertension: Secondary | ICD-10-CM

## 2021-04-11 MED ORDER — CHLORTHALIDONE 50 MG PO TABS: ORAL_TABLET | ORAL | 3 refills | Status: DC

## 2021-04-11 MED ORDER — AMLODIPINE BESYLATE 10 MG PO TABS: ORAL_TABLET | ORAL | 3 refills | Status: DC

## 2021-04-11 MED ORDER — AMLODIPINE BESYLATE 10 MG PO TABS
ORAL_TABLET | ORAL | 0 refills | Status: DC
  Filled 2021-04-11: qty 30, 30d supply, fill #0

## 2021-04-12 ENCOUNTER — Encounter: Payer: Self-pay | Admitting: Family Medicine

## 2021-04-13 ENCOUNTER — Ambulatory Visit: Payer: Self-pay

## 2021-04-13 NOTE — Telephone Encounter (Signed)
Pt calling in stating that he has chest palpitations that have been going on for 2 weeks now. He states that he stopped taking his anxiety medication 3 months ago and is unsure if it's anxiety related that's causing it or not. He says that he gets anxious thinking about something and gets worked up and will sometimes notice it and feels like he's self inflicting it but wants to make sure that his heart is ok. Attempted to schedule appt but first available was 05/16/21 with Robynn Pane, NP. Advised pt I will send over this message and someone can give him a call in the morning with an appt sooner and if he hasnt heard back by lunch time to call the office back. Pt verbalized understanding. Care advice given and pt verbalized understanding. No other questions/concerns noted.    Reason for Disposition  [1] Palpitations AND [2] no improvement after using CARE ADVICE  Answer Assessment - Initial Assessment Questions 1. DESCRIPTION: "Please describe your heart rate or heartbeat that you are having" (e.g., fast/slow, regular/irregular, skipped or extra beats, "palpitations")     Feels like extra beat or fluttering 2. ONSET: "When did it start?" (Minutes, hours or days)      2 weeks ago  3. DURATION: "How long does it last" (e.g., seconds, minutes, hours)     Few seconds  4. PATTERN "Does it come and go, or has it been constant since it started?"  "Does it get worse with exertion?"   "Are you feeling it now?"     Comes and goes, denies currently 5. TAP: "Using your hand, can you tap out what you are feeling on a chair or table in front of you, so that I can hear?" (Note: not all patients can do this)       NA 6. HEART RATE: "Can you tell me your heart rate?" "How many beats in 15 seconds?"  (Note: not all patients can do this)       NA 7. RECURRENT SYMPTOM: "Have you ever had this before?" If Yes, ask: "When was the last time?" and "What happened that time?"      no 8. CAUSE: "What do you think is causing the  palpitations?"     Anxiety related 9. CARDIAC HISTORY: "Do you have any history of heart disease?" (e.g., heart attack, angina, bypass surgery, angioplasty, arrhythmia)      HTN 10. OTHER SYMPTOMS: "Do you have any other symptoms?" (e.g., dizziness, chest pain, sweating, difficulty breathing)       Anxiety increase 11. PREGNANCY: "Is there any chance you are pregnant?" "When was your last menstrual period?"       NA  Protocols used: Heart Rate and Heartbeat Questions-A-AH

## 2021-04-14 NOTE — Telephone Encounter (Signed)
Please advise on whether you can work patient in to be seen sooner?

## 2021-04-15 ENCOUNTER — Telehealth: Payer: Self-pay

## 2021-04-15 NOTE — Telephone Encounter (Signed)
Copied from CRM [id number]. Topic: General - Other >> Apr 14, 2021  4:21 PM Laural Benes, [place] C wrote: Reason for CRM: pt called in to follow up on call back for further assistance with scheduling. Per NT note, showing that someone would assist pt further. Scheduled pt for 12/6 (Tuesday) with provider. If provider wish to see pt sooner, please assist pt further.

## 2021-04-15 NOTE — Telephone Encounter (Signed)
According to telephone note from [place] on 04/14/21 patient has appt scheduled 04/19/21 plan on doing ekg at visit. KW

## 2021-04-15 NOTE — Telephone Encounter (Signed)
Returned call to patient. No answer and no vm. Okay for pec triage to advise patient. Thanks. 

## 2021-04-19 ENCOUNTER — Encounter: Payer: Self-pay | Admitting: Family Medicine

## 2021-04-19 ENCOUNTER — Other Ambulatory Visit: Payer: Self-pay

## 2021-04-19 ENCOUNTER — Ambulatory Visit (INDEPENDENT_AMBULATORY_CARE_PROVIDER_SITE_OTHER): Payer: Self-pay | Admitting: Family Medicine

## 2021-04-19 VITALS — BP 141/91 | HR 96 | Resp 16 | Wt 239.5 lb

## 2021-04-19 DIAGNOSIS — F419 Anxiety disorder, unspecified: Secondary | ICD-10-CM

## 2021-04-19 DIAGNOSIS — F5104 Psychophysiologic insomnia: Secondary | ICD-10-CM

## 2021-04-19 DIAGNOSIS — R4189 Other symptoms and signs involving cognitive functions and awareness: Secondary | ICD-10-CM

## 2021-04-19 DIAGNOSIS — I10 Essential (primary) hypertension: Secondary | ICD-10-CM

## 2021-04-19 DIAGNOSIS — R002 Palpitations: Secondary | ICD-10-CM

## 2021-04-19 DIAGNOSIS — E782 Mixed hyperlipidemia: Secondary | ICD-10-CM

## 2021-04-19 DIAGNOSIS — R0683 Snoring: Secondary | ICD-10-CM

## 2021-04-19 MED ORDER — TRAZODONE HCL 100 MG PO TABS
100.0000 mg | ORAL_TABLET | Freq: Every day | ORAL | 1 refills | Status: DC
Start: 2021-04-19 — End: 2021-05-06

## 2021-04-19 NOTE — Assessment & Plan Note (Signed)
Discussed use of sleep hygiene to assist with symptom management  Recommend trazodone to assist Referral for sleep study to r/o OSA

## 2021-04-19 NOTE — Assessment & Plan Note (Signed)
Long standing hx Unable to perform STOP bang as patient is not aware of all the answers

## 2021-04-19 NOTE — Telephone Encounter (Signed)
Patient seen in office this morning. KW

## 2021-04-19 NOTE — Progress Notes (Signed)
Established patient visit   Patient: Nathan Nelson   DOB: 1985-06-08   35 y.o. Male  MRN: [id number] Visit Date: 04/19/2021  Today's healthcare provider: Jacky Kindle, FNP   Chief Complaint  Patient presents with   Palpitations   Subjective    Palpitations  This is a new problem. The current episode started 1 to 4 weeks ago. The problem occurs daily. The problem has been unchanged. The symptoms are aggravated by stress. Associated symptoms include anxiety and chest pain (left side). Pertinent negatives include no chest fullness, coughing, diaphoresis, dizziness, fever, irregular heartbeat, malaise/fatigue, nausea, near-syncope, numbness, shortness of breath, syncope, vomiting or weakness. Risk factors include obesity, being male and stress. His past medical history is significant for anxiety.     Medications: Outpatient Medications Prior to Visit  Medication Sig   amLODipine (NORVASC) 10 MG tablet Take 1 tablet by mouth once daily   chlorthalidone (HYGROTON) 50 MG tablet Take 1 tablet (50 mg total) by mouth daily.   cyclobenzaprine (FLEXERIL) 5 MG tablet Take 1 tablet (5 mg total) by mouth 3 (three) times daily as needed for muscle spasms.   pravastatin (PRAVACHOL) 20 MG tablet Take 1 tablet (20 mg total) by mouth daily.   [DISCONTINUED] FLUoxetine (PROZAC) 10 MG capsule TAKE 5 CAPSULES BY MOUTH IN THE MORNING (Patient not taking: Reported on 03/11/2021)   [DISCONTINUED] hydrOXYzine (ATARAX/VISTARIL) 10 MG tablet Take 1 tablet (10 mg total) by mouth 3 (three) times daily as needed. (Patient not taking: Reported on 03/11/2021)   [DISCONTINUED] lidocaine (LIDODERM) 5 % Place 1 patch onto the skin every 12 (twelve) hours. Remove & Discard patch within 12 hours or as directed by MD (Patient not taking: Reported on 03/11/2021)   [DISCONTINUED] omeprazole (PRILOSEC) 40 MG capsule Take 1 capsule (40 mg total) by mouth daily. (Patient not taking: Reported on 03/11/2021)   [DISCONTINUED]  Vitamin D, Ergocalciferol, (DRISDOL) 1.25 MG (50000 UNIT) CAPS capsule Take 1 capsule (50,000 Units total) by mouth every 7 (seven) days. (taking one tablet per week) labs 1- 2 weeks after completing medication. (Patient not taking: Reported on 03/11/2021)   No facility-administered medications prior to visit.    Review of Systems  Constitutional:  Negative for diaphoresis, fever and malaise/fatigue.  Respiratory:  Negative for cough and shortness of breath.   Cardiovascular:  Positive for chest pain (left side) and palpitations. Negative for syncope and near-syncope.  Gastrointestinal:  Negative for nausea and vomiting.  Neurological:  Negative for dizziness, weakness and numbness.  Psychiatric/Behavioral:  The patient is nervous/anxious.       Objective    BP (!) 141/91   Pulse 96   Resp 16   Wt 239 lb 8 oz (108.6 kg)   SpO2 98%   BMI 41.11 kg/m    Physical Exam Vitals and nursing note reviewed.  Constitutional:      Appearance: Normal appearance. He is obese.  HENT:     Head: Normocephalic and atraumatic.  Eyes:     Pupils: Pupils are equal, round, and reactive to light.  Cardiovascular:     Rate and Rhythm: Normal rate and regular rhythm.     Pulses: Normal pulses.     Heart sounds: Normal heart sounds.  Pulmonary:     Effort: Pulmonary effort is normal.     Breath sounds: Normal breath sounds.  Musculoskeletal:        General: Normal range of motion.     Cervical back: Normal range  of motion.  Skin:    General: Skin is warm and dry.     Capillary Refill: Capillary refill takes less than 2 seconds.  Neurological:     General: No focal deficit present.     Mental Status: He is alert and oriented to person, place, and time. Mental status is at baseline.  Psychiatric:        Mood and Affect: Mood normal.        Behavior: Behavior normal.        Thought Content: Thought content normal.        Judgment: Judgment normal.     No results found for any visits on  04/19/21.  Assessment & Plan     Problem List Items Addressed This Visit       Cardiovascular and Mediastinum   Hypertension    Advised on use of DASH diet; pt noted that he 'ran up the stairs' prior to visit Continue medication to assist with BP <130/<80        Other   Anxiety    Advised use of medication to assist with anxiety/depression or use of counseling/therapy -patient reluctant to start something at this time -recommend use of non medication approach to assist, ie meditation, deep breathing, guided imagery, focused relaxation, journal use, etc      Relevant Medications   traZODone (DESYREL) 100 MG tablet   Hyperlipidemia    Patient has purchased two OTC medications to assist with HLD Continue to focus on diet and exercise as well Recently got a gym membership and is meeting with a Retail buyer at work      [employer]    Upon wakening, lingers most of the morning Difficult thinking Does not drink caffeine d/t anxiety      Relevant Orders   PSG Sleep Study   Psychophysiological insomnia - Primary    Discussed use of sleep hygiene to assist with symptom management  Recommend trazodone to assist Referral for sleep study to r/o OSA      Relevant Medications   traZODone (DESYREL) 100 MG tablet   Other Relevant Orders   PSG Sleep Study   Snoring    Long standing hx Unable to perform STOP bang as patient is not aware of all the answers      Relevant Orders   PSG Sleep Study   Other Visit Diagnoses     Palpitations       Relevant Orders   EKG 12-Lead (Completed)       Return in about 6 weeks (around 05/31/2021) for anxiety and depression, weight mgmt.      Leilani Merl, FNP, have reviewed all documentation for this visit. The documentation on 04/19/21 for the exam, diagnosis, procedures, and orders are all accurate and complete.    Jacky Kindle, FNP  [facility] [phone] (phone) [phone] (fax)  [facility]

## 2021-04-19 NOTE — Assessment & Plan Note (Signed)
Upon wakening, lingers most of the morning Difficult thinking Does not drink caffeine d/t anxiety

## 2021-04-19 NOTE — Assessment & Plan Note (Signed)
Advised on use of DASH diet; pt noted that he 'ran up the stairs' prior to visit Continue medication to assist with BP <130/<80

## 2021-04-19 NOTE — Assessment & Plan Note (Signed)
Advised use of medication to assist with anxiety/depression or use of counseling/therapy -patient reluctant to start something at this time -recommend use of non medication approach to assist, ie meditation, deep breathing, guided imagery, focused relaxation, journal use, etc

## 2021-04-19 NOTE — Assessment & Plan Note (Signed)
Patient has purchased two OTC medications to assist with HLD Continue to focus on diet and exercise as well Recently got a gym membership and is meeting with a Retail buyer at work

## 2021-04-25 ENCOUNTER — Encounter: Payer: Self-pay | Admitting: Family Medicine

## 2021-04-26 ENCOUNTER — Telehealth: Payer: Self-pay

## 2021-04-26 ENCOUNTER — Telehealth (INDEPENDENT_AMBULATORY_CARE_PROVIDER_SITE_OTHER): Payer: Medicaid Other | Admitting: Family Medicine

## 2021-04-26 ENCOUNTER — Encounter: Payer: Self-pay | Admitting: Family Medicine

## 2021-04-26 ENCOUNTER — Telehealth: Payer: Self-pay | Admitting: Family Medicine

## 2021-04-26 DIAGNOSIS — M25532 Pain in left wrist: Secondary | ICD-10-CM

## 2021-04-26 NOTE — Progress Notes (Signed)
Virtual Visit via Video Note  I connected with Nathan Nelson on 04/26/21 at  4:00 PM EST by a video enabled telemedicine application and verified that I am speaking with the correct person using two identifiers.  Location: Patient: home Provider: [facility]   I discussed the limitations of evaluation and management by telemedicine and the availability of in person appointments. The patient expressed understanding and agreed to proceed.  History of Present Illness:  WRIST PAIN  Duration:  3 days Involved wrist: left Mechanism of injury:  no trauma Location: diffuse Onset: sudden Quality:  pins and needles Frequency: intermittent, lasts for few minutes. No pain or numbness currently. Radiation: no Aggravating factors: nothing  Alleviating factors: nothing  Status: resolved Treatments attempted: none    Relief with NSAIDs?:  No NSAIDs Taken Weakness: no Numbness: yes  elbow, whole hand Redness: no Bruising: no Swelling: no Fevers: no    Observations/Objective:  Patient had trouble connecting to video visit, entirety of visit conducted over the phone.  Speaks in full sentences, no respiratory distress.   Assessment and Plan:  Wrist pain Symptoms consistent with potential nerve etiology though distribution unclear from history and unable to evaluate fully over the phone. Recommend in person visit for more complete evaluation. Currently symptom free. Will schedule in person visit once he knows work schedule. Try wrist brace in the meantime if symptoms recur.     I discussed the assessment and treatment plan with the patient. The patient was provided an opportunity to ask questions and all were answered. The patient agreed with the plan and demonstrated an understanding of the instructions.   The patient was advised to call back or seek an in-person evaluation if the symptoms worsen or if the condition fails to improve as anticipated.  I provided 8 minutes of non-face-to-face  time during this encounter.   Caro Laroche, DO

## 2021-04-26 NOTE — Telephone Encounter (Signed)
Copied from CRM [id number]. Topic: General - Other >> Apr 26, 2021  3:48 PM Marylen Ponto wrote: Reason for CRM: Pt stated he is at work an unable to answer his phone. Pt asked that he be contacted at 4 pm or a little after.

## 2021-04-26 NOTE — Telephone Encounter (Signed)
Provider aware

## 2021-04-28 ENCOUNTER — Ambulatory Visit: Payer: Self-pay

## 2021-04-28 NOTE — Telephone Encounter (Signed)
°  Chief Complaint: Anxiety Symptoms: Started 2 weeks ago Frequency: Worse at work Pertinent Negatives: Patient denies any thoughts of self-harm Disposition: [] ED /[] Urgent Care (no appt availability in office) / [] Appointment(In office/virtual)/ []  [facility]/ [] Home Care/ [] Refused Recommended Disposition  Additional Notes: Pt. Asking to be seen for increasing anxiety. Had been on medication but stopped it several months ago. Will see any provider. Would like to be seen as soon as possible. Currently at work. Reach out to pt. Through My Chart. Office closed for lunch. New insurance - [employer].    Answer Assessment - Initial Assessment Questions 1. CONCERN: "Did anything happen that prompted you to call today?"      Anxiety 2. ANXIETY SYMPTOMS: "Can you describe how you (your loved one; patient) have been feeling?" (e.g., tense, restless, panicky, anxious, keyed up, overwhelmed, sense of impending doom).      Anxious 3. ONSET: "How long have you been feeling this way?" (e.g., hours, days, weeks)     2 weeks ago 4. SEVERITY: "How would you rate the level of anxiety?" (e.g., 0 - 10; or mild, moderate, severe).     Moderate 5. FUNCTIONAL IMPAIRMENT: "How have these feelings affected your ability to do daily activities?" "Have you had more difficulty than usual doing your normal daily activities?" (e.g., getting better, same, worse; self-care, school, work, interactions)    6. HISTORY: "Have you felt this way before?" "Have you ever been diagnosed with an anxiety problem in the past?" (e.g., generalized anxiety disorder, panic attacks, PTSD). If Yes, ask: "How was this problem treated?" (e.g., medicines, counseling, etc.)     Not sleeping well 7. RISK OF HARM - SUICIDAL IDEATION: "Do you ever have thoughts of hurting or killing yourself?" If Yes, ask:  "Do you have these feelings now?" "Do you have a plan on how you would do this?"     No 8. TREATMENT:  "What has been done so far to  treat this anxiety?" (e.g., medicines, relaxation strategies). "What has helped?"     Had been on medication 9. TREATMENT - THERAPIST: "Do you have a counselor or therapist? Name?"     No 10. POTENTIAL TRIGGERS: "Do you drink caffeinated beverages (e.g., coffee, colas, teas), and how much daily?" "Do you drink alcohol or use any drugs?" "Have you started any new medicines recently?"     No 10. PATIENT SUPPORT: "Who is with you now?" "Who do you live with?" "Do you have family or friends who you can talk to?"        Family 11. OTHER SYMPTOMS: "Do you have any other symptoms?" (e.g., feeling depressed, trouble concentrating, trouble sleeping, trouble breathing, palpitations or fast heartbeat, chest pain, sweating, nausea, or diarrhea)       Not sleeping 12. PREGNANCY: "Is there any chance you are pregnant?" "When was your last menstrual period?"       N/a  Protocols used: Anxiety and Panic Attack-A-AH

## 2021-04-28 NOTE — Telephone Encounter (Signed)
Will send message through mychart

## 2021-05-05 NOTE — Progress Notes (Signed)
Established patient visit   Patient: Nathan Nelson   DOB: 11-13-1985   35 y.o. Male  MRN: [id number] Visit Date: 05/06/2021  Today's healthcare provider: Alfredia Ferguson, PA-C   Chief Complaint  Patient presents with   Anxiety   Subjective    HPI  Anxiety, Follow-up  He was last seen for anxiety 2 weeks ago. Changes made at last visit include recommend use of non medication approach to assist, ie meditation, deep breathing, guided imagery, focused relaxation, journal use, etc. Previously tried fluoxetine did not tolerate (03/11/21 OV). Has previously also tried hydrOXYzine (ATARAX/VISTARIL) 10 MG tablet; Take 1 tablet (10 mg total) by mouth 3 (three) times daily as needed.    He feels his anxiety is mild and Unchanged since last visit. Trazodone- didn't work for sleep stopped taking. He states that he feels a dizziness/brain fog that sometimes can take over his whole body. He tried one of his mom's lorazepam pills which he said made him feel relaxed, able to run errands.   Symptoms: No chest pain No difficulty concentrating  Yes dizziness Yes fatigue  No feelings of losing control No insomnia  No irritable No palpitations  No panic attacks Yes racing thoughts  No shortness of breath No sweating  No tremors/shakes    GAD-7 Results GAD-7 Generalized Anxiety Disorder Screening Tool 04/06/2020  1. Feeling Nervous, Anxious, or on Edge 3  2. Not Being Able to Stop or Control Worrying 3  3. Worrying Too Much About Different Things 1  4. Trouble Relaxing 0  5. Being So Restless it's Hard To Sit Still 1  6. Becoming Easily Annoyed or Irritable 3  7. Feeling Afraid As If Something Awful Might Happen 1  Total GAD-7 Score 12  Difficulty At Work, Home, or Getting  Along With Others? Somewhat difficult    PHQ-9 Scores PHQ9 SCORE ONLY 05/06/2021 03/11/2021 03/19/2020  PHQ-9 Total Score 7 3 11      --------------------------------------------------------------------------------------  Medications: Outpatient Medications Prior to Visit  Medication Sig   amLODipine (NORVASC) 10 MG tablet Take 1 tablet by mouth once daily   chlorthalidone (HYGROTON) 50 MG tablet Take 1 tablet (50 mg total) by mouth daily.   cyclobenzaprine (FLEXERIL) 5 MG tablet Take 1 tablet (5 mg total) by mouth 3 (three) times daily as needed for muscle spasms.   pravastatin (PRAVACHOL) 20 MG tablet Take 1 tablet (20 mg total) by mouth daily.   [DISCONTINUED] traZODone (DESYREL) 100 MG tablet Take 1 tablet (100 mg total) by mouth at bedtime.   No facility-administered medications prior to visit.    Review of Systems  Constitutional:  Positive for fatigue.  Respiratory:  Negative for cough and shortness of breath.   Cardiovascular:  Negative for chest pain.  Psychiatric/Behavioral:  Positive for sleep disturbance. The patient is nervous/anxious.      Objective     Blood pressure 123/86, pulse 74, resp. rate 16, weight 236 lb 3.2 oz (107.1 kg), SpO2 99 %.   Physical Exam Constitutional:      Appearance: He is not ill-appearing.  HENT:     Head: Normocephalic.  Eyes:     Conjunctiva/sclera: Conjunctivae normal.  Cardiovascular:     Rate and Rhythm: Normal rate.  Pulmonary:     Effort: Pulmonary effort is normal. No respiratory distress.  Neurological:     General: No focal deficit present.     Mental Status: He is alert and oriented to person, place, and time.  Psychiatric:  Mood and Affect: Mood normal.        Behavior: Behavior normal.     No results found for any visits on 05/06/21.  Assessment & Plan     Problem List Items Addressed This Visit       Other   GAD (generalized anxiety disorder) - Primary    Discussion of anxiety and how it presents physically in the body Pt has started exercising, eating mindfully, with healthier foods Encouraged this behavior Discussed  short-term anxiety meds vs daily anxiety meds Pt understands side effects of lorazepam, only to use during an 'anxiety emergency'  rx Effexor, will f/u 4 weeks to assess SE and how he feels Encouraged counselor -- ref to CCM to help coordinate      Relevant Medications   LORazepam (ATIVAN) 0.5 MG tablet   venlafaxine XR (EFFEXOR XR) 75 MG 24 hr capsule   Other Relevant Orders   AMB Referral to [facility] Coordinaton   Other Visit Diagnoses     Panic attacks       Relevant Medications   LORazepam (ATIVAN) 0.5 MG tablet   venlafaxine XR (EFFEXOR XR) 75 MG 24 hr capsule   Other Relevant Orders   AMB Referral to [facility] Coordinaton        Return in about 1 month (around 06/06/2021) for anxiety.      I, Alfredia Ferguson, PA-C have reviewed all documentation for this visit. The documentation on  05/06/2021 for the exam, diagnosis, procedures, and orders are all accurate and complete.    Alfredia Ferguson, PA-C  [facility] [phone] (phone) [phone] (fax)  [facility]

## 2021-05-06 ENCOUNTER — Other Ambulatory Visit: Payer: Self-pay

## 2021-05-06 ENCOUNTER — Ambulatory Visit (INDEPENDENT_AMBULATORY_CARE_PROVIDER_SITE_OTHER): Payer: Self-pay | Admitting: Physician Assistant

## 2021-05-06 ENCOUNTER — Encounter: Payer: Self-pay | Admitting: Physician Assistant

## 2021-05-06 VITALS — BP 123/86 | HR 74 | Resp 16 | Wt 236.2 lb

## 2021-05-06 DIAGNOSIS — F410 Panic disorder [episodic paroxysmal anxiety] without agoraphobia: Secondary | ICD-10-CM

## 2021-05-06 DIAGNOSIS — F411 Generalized anxiety disorder: Secondary | ICD-10-CM

## 2021-05-06 MED ORDER — VENLAFAXINE HCL ER 75 MG PO CP24: 75.0000 mg | ORAL_CAPSULE | Freq: Every day | ORAL | 1 refills | Status: DC

## 2021-05-06 MED ORDER — LORAZEPAM 0.5 MG PO TABS: 0.5000 mg | ORAL_TABLET | Freq: Every day | ORAL | 0 refills | Status: DC | PRN

## 2021-05-06 NOTE — Assessment & Plan Note (Signed)
Discussion of anxiety and how it presents physically in the body Pt has started exercising, eating mindfully, with healthier foods Encouraged this behavior Discussed short-term anxiety meds vs daily anxiety meds Pt understands side effects of lorazepam, only to use during an 'anxiety emergency'  rx Effexor, will f/u 4 weeks to assess SE and how he feels Encouraged counselor -- ref to CCM to help coordinate

## 2021-05-14 ENCOUNTER — Encounter: Payer: Self-pay | Admitting: Family Medicine

## 2021-05-17 ENCOUNTER — Telehealth: Payer: Self-pay | Admitting: *Deleted

## 2021-05-17 NOTE — Chronic Care Management (AMB) (Signed)
°  Care Management   Note  05/17/2021 Name: VIPUL CAFARELLI MRN: [id number] DOB: April 16, 1986  Orion Modest is a 36 y.o. year old male who is a primary care patient of Jacky Kindle, FNP. I reached out to Orion Modest by phone today in response to a referral sent by Mr. LORCAN SHELP primary care provider.   Mr. Carsten was given information about care management services today including:  Care management services include personalized support from designated clinical staff supervised by his physician, including individualized plan of care and coordination with other care providers 24/7 contact phone numbers for assistance for urgent and routine care needs. The patient may stop care management services at any time by phone call to the office staff.  Patient agreed to services and verbal consent obtained.   Follow up plan: Telephone appointment with care management team member scheduled for: 05/31/2021  Burman Nieves, CCMA Care Guide, Embedded Care Coordination [facility]   Care Management  Direct Dial: [phone]

## 2021-05-26 ENCOUNTER — Other Ambulatory Visit: Payer: Self-pay

## 2021-05-26 ENCOUNTER — Other Ambulatory Visit
Admission: RE | Admit: 2021-05-26 | Discharge: 2021-05-26 | Disposition: A | Discharge status: Home / Self Care | Payer: Medicaid Other | Source: Ambulatory Visit | Attending: Family Medicine | Admitting: Family Medicine

## 2021-05-26 DIAGNOSIS — Z20822 Contact with and (suspected) exposure to covid-19: Secondary | ICD-10-CM | POA: Insufficient documentation

## 2021-05-26 LAB — SARS CORONAVIRUS 2 (TAT 6-24 HRS): SARS Coronavirus 2: NEGATIVE

## 2021-05-27 ENCOUNTER — Ambulatory Visit: Payer: Self-pay | Attending: Neurology

## 2021-05-27 DIAGNOSIS — R4189 Other symptoms and signs involving cognitive functions and awareness: Secondary | ICD-10-CM | POA: Insufficient documentation

## 2021-05-27 DIAGNOSIS — F5104 Psychophysiologic insomnia: Secondary | ICD-10-CM | POA: Insufficient documentation

## 2021-05-27 DIAGNOSIS — R0683 Snoring: Secondary | ICD-10-CM | POA: Insufficient documentation

## 2021-05-31 ENCOUNTER — Ambulatory Visit: Payer: Medicaid Other | Admitting: *Deleted

## 2021-05-31 DIAGNOSIS — F410 Panic disorder [episodic paroxysmal anxiety] without agoraphobia: Secondary | ICD-10-CM

## 2021-05-31 DIAGNOSIS — F411 Generalized anxiety disorder: Secondary | ICD-10-CM

## 2021-05-31 NOTE — Patient Instructions (Signed)
Visit Information  Thank you for taking time to visit with me today. Please don't hesitate to contact me if I can be of assistance to you before our next scheduled telephone appointment.  Following are the goals we discussed today: - begin personal counseling - practice relaxation or meditation daily - talk about feelings with a friend, family or spiritual advisor - practice positive thinking and self-talk  Our next appointment is by telephone on 06/14/21 at 9am  Please call the care guide team at [phone] if you need to cancel or reschedule your appointment.   If you are experiencing a Mental Health or Behavioral Health Crisis or need someone to talk to, please call the Suicide and Crisis Lifeline: 32   Following is a copy of your full plan of care:  Care Plan : Anxiety (Adult)  Updates made by Wenda Overland, LCSW since 05/31/2021 12:00 AM     Problem: Symptoms (Anxiety)      Goal: Anxiety Symptoms Monitored and Managed   Priority: Medium  Note:   CARE PLAN ENTRY (see longitudinal plan of care for additional care plan information)  Current Barriers:  Knowledge deficits related to accessing mental health provider in patient with Anxiety  Patient is experiencing symptoms of  anxiety which seem to be exacerbated by history of trauma     Patient needs Support, Education, and Care Coordination in order to meet unmet mental health needs  Mental Health Concerns   Clinical Social Work Goal(s):  Over the next 90 days, patient will work with SW bi-weekly by telephone or in person to reduce or manage symptoms of anxiety until connected for ongoing counseling resources if needed Patient will implement clinical interventions discussed today to decreases symptoms of anxiety and increase knowledge and/or ability of: coping skills.  Interventions:  Assessed patient's understanding, education, previous treatment and care coordination needs  Patient interviewed and appropriate  assessments performed: PHQ 2 Provided basic mental health support, education and interventions  Patient described a strong family support system to confide in and has started to implement coping strategies including healthy eating, exercise, spirituality Collaborated with appropriate clinical care team members regarding patient needs Discussed  options for long term counseling based on need and insurance if needed and will re-evaluate need during next visit Reviewed mental health medications with patient prescribed by PCP and discussed compliance  Other interventions include: Motivational Interviewing employed Active listening / Reflection utilized  Industrial/product designer Provided   Patient Self Care Activities & Deficits:  Patient is unable to independently navigate community resource options without care coordination support Patient is able to implement clinical interventions discussed today and is motivated for treatment  Performs ADL's independently Performs IADL's independently Ability for insight Motivation for treatment Strong family or social support  Initial goal documentation        Task: Alleviate Barriers to Anxiety Treatment Success       Nathan Nelson was given information about Care Management services by the embedded care coordination team including:  Care Management services include personalized support from designated clinical staff supervised by his physician, including individualized plan of care and coordination with other care providers 24/7 contact phone numbers for assistance for urgent and routine care needs. The patient may stop CCM services at any time (effective at the end of the month) by phone call to the office staff.  Patient agreed to services and verbal consent obtained.   The patient verbalized understanding of instructions, educational materials, and care plan provided today and  declined offer to receive copy of patient instructions, educational  materials, and care plan.    Telephone follow up appointment with care management team member scheduled for: 06/14/21   Verna Czech, LCSW Clinical Social Worker  [facility] Care Management [phone]

## 2021-05-31 NOTE — Chronic Care Management (AMB) (Signed)
Care Management Clinical Social Work Note  05/31/2021 Name: Nathan Nelson MRN: [id number] DOB: 1985-10-14  Nathan Nelson is a 36 y.o. year old male who is a primary care patient of Jacky Kindle, FNP.  The Care Management team was consulted for assistance with chronic disease management and coordination needs.  Engaged with patient by telephone for follow up visit in response to provider referral for social work chronic care management and care coordination services  Consent to Services:  Nathan Nelson was given information about Care Management services today including:  Care Management services includes personalized support from designated clinical staff supervised by his physician, including individualized plan of care and coordination with other care providers 24/7 contact phone numbers for assistance for urgent and routine care needs. The patient may stop case management services at any time by phone call to the office staff.  Patient agreed to services and consent obtained.   Assessment: Review of patient past medical history, allergies, medications, and health status, including review of relevant consultants reports was performed today as part of a comprehensive evaluation and provision of chronic care management and care coordination services.  SDOH (Social Determinants of Health) assessments and interventions performed:    Advanced Directives Status: Not addressed in this encounter.  Care Plan  Allergies  Allergen Reactions   Lisinopril Other (See Comments)    Tightness in chest    Outpatient Encounter Medications as of 05/31/2021  Medication Sig   amLODipine (NORVASC) 10 MG tablet Take 1 tablet by mouth once daily   chlorthalidone (HYGROTON) 50 MG tablet Take 1 tablet (50 mg total) by mouth daily.   cyclobenzaprine (FLEXERIL) 5 MG tablet Take 1 tablet (5 mg total) by mouth 3 (three) times daily as needed for muscle spasms.   LORazepam (ATIVAN) 0.5 MG tablet Take 1 tablet  (0.5 mg total) by mouth daily as needed for anxiety.   pravastatin (PRAVACHOL) 20 MG tablet Take 1 tablet (20 mg total) by mouth daily.   venlafaxine XR (EFFEXOR XR) 75 MG 24 hr capsule Take 1 capsule (75 mg total) by mouth daily.   No facility-administered encounter medications on file as of 05/31/2021.    Patient Active Problem List   Diagnosis Date Noted   GAD (generalized anxiety disorder) 05/06/2021   Brain fog 04/19/2021   Psychophysiological insomnia 04/19/2021   Snoring 04/19/2021   Gastroesophageal reflux disease 07/16/2020   Hyperlipidemia 04/06/2020   Body mass index (BMI) of 38.0-38.9 in adult 04/06/2020   Lymphocytosis 03/24/2020   Vitamin D deficiency 03/19/2020   Anxiety 12/06/2015   Hypertension     Conditions to be addressed/monitored: Anxiety; Mental Health Concerns   Care Plan : Anxiety (Adult)  Updates made by Wenda Overland, LCSW since 05/31/2021 12:00 AM     Problem: Symptoms (Anxiety)      Goal: Anxiety Symptoms Monitored and Managed   Priority: Medium  Note:   CARE PLAN ENTRY (see longitudinal plan of care for additional care plan information)  Current Barriers:  Knowledge deficits related to accessing mental health provider in patient with Anxiety  Patient is experiencing symptoms of  anxiety which seem to be exacerbated by history of trauma     Patient needs Support, Education, and Care Coordination in order to meet unmet mental health needs  Mental Health Concerns   Clinical Social Work Goal(s):  Over the next 90 days, patient will work with SW bi-weekly by telephone or in person to reduce or manage symptoms of anxiety until  connected for ongoing counseling resources if needed Patient will implement clinical interventions discussed today to decreases symptoms of anxiety and increase knowledge and/or ability of: coping skills.  Interventions:  Assessed patient's understanding, education, previous treatment and care coordination needs  Patient  interviewed and appropriate assessments performed: PHQ 2 Provided basic mental health support, education and interventions  Patient described a strong family support system to confide in and has started to implement coping strategies including healthy eating, exercise, spirituality Collaborated with appropriate clinical care team members regarding patient needs Discussed  options for long term counseling based on need and insurance if needed and will re-evaluate need during next visit Reviewed mental health medications with patient prescribed by PCP and discussed compliance  Other interventions include: Motivational Interviewing employed Active listening / Reflection utilized  Industrial/product designer Provided   Patient Self Care Activities & Deficits:  Patient is unable to independently navigate community resource options without care coordination support Patient is able to implement clinical interventions discussed today and is motivated for treatment  Performs ADL's independently Performs IADL's independently Ability for insight Motivation for treatment Strong family or social support  Initial goal documentation        Task: Alleviate Barriers to Anxiety Treatment Success        Follow Up Plan: SW will follow up with patient by phone over the next 14 business days   [employer], [place] Clinical Social Worker  [facility] Care [facility] [phone]

## 2021-06-01 ENCOUNTER — Encounter: Payer: Self-pay | Admitting: Pulmonary Disease

## 2021-06-02 ENCOUNTER — Other Ambulatory Visit: Payer: Self-pay

## 2021-06-02 ENCOUNTER — Inpatient Hospital Stay: Admission: RE | Admit: 2021-06-02 | Payer: Medicaid Other | Source: Ambulatory Visit

## 2021-06-03 ENCOUNTER — Other Ambulatory Visit: Payer: Self-pay | Admitting: Physician Assistant

## 2021-06-03 DIAGNOSIS — F410 Panic disorder [episodic paroxysmal anxiety] without agoraphobia: Secondary | ICD-10-CM

## 2021-06-06 ENCOUNTER — Encounter: Payer: Self-pay | Admitting: Family Medicine

## 2021-06-06 MED ORDER — LORAZEPAM 0.5 MG PO TABS: 0.2500 mg | ORAL_TABLET | Freq: Every day | ORAL | 0 refills | Status: DC | PRN

## 2021-06-10 ENCOUNTER — Other Ambulatory Visit: Payer: Self-pay

## 2021-06-10 ENCOUNTER — Encounter: Payer: Self-pay | Admitting: Family Medicine

## 2021-06-10 ENCOUNTER — Ambulatory Visit (INDEPENDENT_AMBULATORY_CARE_PROVIDER_SITE_OTHER): Payer: Self-pay | Admitting: Family Medicine

## 2021-06-10 DIAGNOSIS — G4733 Obstructive sleep apnea (adult) (pediatric): Secondary | ICD-10-CM

## 2021-06-10 DIAGNOSIS — F419 Anxiety disorder, unspecified: Secondary | ICD-10-CM | POA: Insufficient documentation

## 2021-06-10 DIAGNOSIS — R002 Palpitations: Secondary | ICD-10-CM

## 2021-06-10 DIAGNOSIS — F5104 Psychophysiologic insomnia: Secondary | ICD-10-CM

## 2021-06-10 DIAGNOSIS — F32A Depression, unspecified: Secondary | ICD-10-CM | POA: Insufficient documentation

## 2021-06-10 NOTE — Assessment & Plan Note (Signed)
Improved PHQ and GAD completed and reviewed Contracted to safety Denies SI or HI Limited use of PRN medication

## 2021-06-10 NOTE — Assessment & Plan Note (Signed)
Improved; reports 1x/week

## 2021-06-10 NOTE — Progress Notes (Signed)
Virtual telephone visit    Virtual Visit via Telephone Note   This visit type was conducted due to national recommendations for restrictions regarding the COVID-19 Pandemic (e.g. social distancing) in an effort to limit this patient's exposure and mitigate transmission in our community. Due to his co-morbid illnesses, this patient is at least at moderate risk for complications without adequate follow up. This format is felt to be most appropriate for this patient at this time. The patient did not have access to video technology or had technical difficulties with video requiring transitioning to audio format only (telephone). Physical exam was limited to content and character of the telephone converstion.    Patient location: home Provider location: [facility]  I discussed the limitations of evaluation and management by telemedicine and the availability of in person appointments. The patient expressed understanding and agreed to proceed.   Visit Date: 06/10/2021  Today's healthcare provider: Jacky Kindle, FNP   Chief Complaint  Patient presents with   Anxiety   Subjective    HPI  Anxiety, Follow-up  He was last seen for anxiety 1 months ago. Changes made at last visit include patient was started on Lorazepam 0.5mg  and Venlafaxine XR 75mg .   He reports excellent compliance with treatment. He reports excellent tolerance of treatment. He is not having side effects.   He feels his anxiety is mild and Improved since last visit.  Symptoms: No chest pain No difficulty concentrating  No dizziness No fatigue  No feelings of losing control Yes insomnia  No irritable Yes palpitations  No panic attacks Yes racing thoughts  No shortness of breath No sweating  No tremors/shakes    GAD-7 Results GAD-7 Generalized Anxiety Disorder Screening Tool 06/10/2021 04/06/2020  1. Feeling Nervous, Anxious, or on Edge 1 3  2. Not Being Able to Stop or Control Worrying 0 3  3. Worrying Too Much  About Different Things 0 1  4. Trouble Relaxing 1 0  5. Being So Restless it's Hard To Sit Still 0 1  6. Becoming Easily Annoyed or Irritable 1 3  7. Feeling Afraid As If Something Awful Might Happen 1 1  Total GAD-7 Score 4 12  Difficulty At Work, Home, or Getting  Along With Others? Somewhat difficult Somewhat difficult    PHQ-9 Scores PHQ9 SCORE ONLY 06/10/2021 05/31/2021 05/06/2021  PHQ-9 Total Score 4 0 7    ---------------------------------------------------------------------------------------------------     Medications: Outpatient Medications Prior to Visit  Medication Sig   amLODipine (NORVASC) 10 MG tablet Take 1 tablet by mouth once daily   chlorthalidone (HYGROTON) 50 MG tablet Take 1 tablet (50 mg total) by mouth daily.   cyclobenzaprine (FLEXERIL) 5 MG tablet Take 1 tablet (5 mg total) by mouth 3 (three) times daily as needed for muscle spasms.   LORazepam (ATIVAN) 0.5 MG tablet Take 0.5 tablets (0.25 mg total) by mouth daily as needed for anxiety.   pravastatin (PRAVACHOL) 20 MG tablet Take 1 tablet (20 mg total) by mouth daily.   venlafaxine XR (EFFEXOR XR) 75 MG 24 hr capsule Take 1 capsule (75 mg total) by mouth daily.   No facility-administered medications prior to visit.    Review of Systems    Objective    There were no vitals taken for this visit.     Assessment & Plan     Problem List Items Addressed This Visit       Respiratory   OSA (obstructive sleep apnea)    Discussed results  of sleep study Will order for CPAP equipment         Other   Psychophysiological insomnia    Discussed sleep hygiene to assist; OK to use trazodone previously prescribed Will improve with CPAP machine      Anxiety and depression - Primary    Improved PHQ and GAD completed and reviewed Contracted to safety Denies SI or HI Limited use of PRN medication       Palpitations    Improved; reports 1x/week        Return in about 4 weeks (around 07/08/2021)  for chonic disease management.    I discussed the assessment and treatment plan with the patient. The patient was provided an opportunity to ask questions and all were answered. The patient agreed with the plan and demonstrated an understanding of the instructions.   The patient was advised to call back or seek an in-person evaluation if the symptoms worsen or if the condition fails to improve as anticipated.  I provided 21 minutes of non-face-to-face time during this encounter.  Leilani Merl, FNP, have reviewed all documentation for this visit. The documentation on 06/10/21 for the exam, diagnosis, procedures, and orders are all accurate and complete.  Patient seen and examined by Merita Norton,  FNP note scribed by Sheliah Hatch, NCMA  Jacky Kindle, FNP [facility] [phone] (phone) [phone] (fax)  [facility]

## 2021-06-10 NOTE — Assessment & Plan Note (Signed)
Discussed sleep hygiene to assist; OK to use trazodone previously prescribed Will improve with CPAP machine

## 2021-06-10 NOTE — Assessment & Plan Note (Signed)
Discussed results of sleep study Will order for CPAP equipment

## 2021-06-14 ENCOUNTER — Telehealth: Payer: Medicaid Other

## 2021-06-14 ENCOUNTER — Telehealth: Payer: Self-pay

## 2021-06-14 ENCOUNTER — Ambulatory Visit: Payer: Medicaid Other | Admitting: *Deleted

## 2021-06-14 DIAGNOSIS — F32A Depression, unspecified: Secondary | ICD-10-CM

## 2021-06-14 NOTE — Patient Instructions (Addendum)
Visit Information  Thank you for taking time to visit with me today. Please don't hesitate to contact me if I can be of assistance to you before our next scheduled telephone appointment.  Following are the goals we discussed today:  - begin personal counseling - practice relaxation or meditation daily - talk about feelings with a friend, family or spiritual advisor - practice positive thinking and self-talk   If you are experiencing a Mental Health or [place] or need someone to talk to, please call the Suicide and Crisis Lifeline: 988   Patient verbalizes understanding of instructions and care plan provided today and agrees to view in [place]. Active MyChart status confirmed with patient.    No further follow up required: patient to contact this Education officer, museum with any additional mental health or community resource needs   [place], [place] Worker  [facility] Care Management [phone]

## 2021-06-14 NOTE — Telephone Encounter (Signed)
I remember seeing his sleep study report the other day, have you had time to review? KW

## 2021-06-14 NOTE — Chronic Care Management (AMB) (Signed)
Care Management Clinical Social Work Note  06/14/2021 Name: Nathan Nelson MRN: [id number] DOB: 1986/01/08  Nathan Nelson is a 35 y.o. year old male who is a primary care patient of Nathan Kindle, FNP.  The Care Management team was consulted for assistance with chronic disease management and coordination needs.  Engaged with patient by telephone for follow up visit in response to provider referral for social work chronic care management and care coordination services  Consent to Services:  Nathan Nelson was given information about Care Management services today including:  Care Management services includes personalized support from designated clinical staff supervised by his physician, including individualized plan of care and coordination with other care providers 24/7 contact phone numbers for assistance for urgent and routine care needs. The patient may stop case management services at any time by phone call to the office staff.  Patient agreed to services and consent obtained.   Assessment: Review of patient past medical history, allergies, medications, and health status, including review of relevant consultants reports was performed today as part of a comprehensive evaluation and provision of chronic care management and care coordination services.  SDOH (Social Determinants of Health) assessments and interventions performed:    Advanced Directives Status: Not addressed in this encounter.  Care Plan  Allergies  Allergen Reactions   Lisinopril Other (See Comments)    Tightness in chest    Outpatient Encounter Medications as of 06/14/2021  Medication Sig   amLODipine (NORVASC) 10 MG tablet Take 1 tablet by mouth once daily   chlorthalidone (HYGROTON) 50 MG tablet Take 1 tablet (50 mg total) by mouth daily.   cyclobenzaprine (FLEXERIL) 5 MG tablet Take 1 tablet (5 mg total) by mouth 3 (three) times daily as needed for muscle spasms.   LORazepam (ATIVAN) 0.5 MG tablet Take 0.5 tablets  (0.25 mg total) by mouth daily as needed for anxiety.   pravastatin (PRAVACHOL) 20 MG tablet Take 1 tablet (20 mg total) by mouth daily.   venlafaxine XR (EFFEXOR XR) 75 MG 24 hr capsule Take 1 capsule (75 mg total) by mouth daily.   No facility-administered encounter medications on file as of 06/14/2021.    Patient Active Problem List   Diagnosis Date Noted   Anxiety and depression 06/10/2021   Palpitations 06/10/2021   OSA (obstructive sleep apnea) 06/10/2021   GAD (generalized anxiety disorder) 05/06/2021   Brain fog 04/19/2021   Psychophysiological insomnia 04/19/2021   Snoring 04/19/2021   Gastroesophageal reflux disease 07/16/2020   Hyperlipidemia 04/06/2020   Body mass index (BMI) of 38.0-38.9 in adult 04/06/2020   Lymphocytosis 03/24/2020   Vitamin D deficiency 03/19/2020   Anxiety 12/06/2015   Hypertension     Conditions to be addressed/monitored: Anxiety and Depression; Mental Health Concerns   Care Plan : Anxiety (Adult)  Updates made by Nathan Overland, LCSW since 06/14/2021 12:00 AM     Problem: Symptoms (Anxiety)      Goal: Anxiety Symptoms Monitored and Managed   This Visit's Progress: On track  Priority: Medium  Note:   CARE PLAN ENTRY (see longitudinal plan of care for additional care plan information)  Current Barriers:  Knowledge deficits related to accessing mental health provider in patient with Anxiety  Patient is experiencing symptoms of  anxiety which seem to be exacerbated by history of trauma     Patient needs Support, Education, and Care Coordination in order to meet unmet mental health needs  Mental Health Concerns   Clinical Social Work Goal(s):  Over the next 90 days, patient will work with SW bi-weekly by telephone or in person to reduce or manage symptoms of anxiety until connected for ongoing counseling resources if needed Patient will implement clinical interventions discussed today to decreases symptoms of anxiety and increase  knowledge and/or ability of: coping skills.  Interventions:  Assessed patient's understanding, education, previous treatment and care coordination needs  Provided basic mental health support, education and interventions  Patient continues to describe a strong family support system to confide in and has started to implement coping strategies including healthy eating, exercise, spirituality Discussed  options for long term counseling based on need and insurance if needed-patient continues to decline need for long term mental health follow up Reviewed mental health medications with patient prescribed by PCP and discussed compliance-per patient, medication taken on an as needed basis Patient confirmed that symptoms of depression and anxiety are well managed and confirms to further follow up at this time Other interventions include: Motivational Interviewing employed Active listening / Reflection utilized  Industrial/product designer Provided  CCM social worker's contact information provided to assist with any additional community resource needs that may arise in the future.   Patient Self Care Activities & Deficits:  Patient is unable to independently navigate community resource options without care coordination support Patient is able to implement clinical interventions discussed today and is motivated for treatment  Performs ADL's independently Performs IADL's independently Ability for insight Motivation for treatment Strong family or social support  Please see past updates related to this goal by clicking on the "Past Updates" button in the selected goal           Follow Up Plan: Client will contact this social worker with any additional community resource needs.   Nathan Czech, LCSW Clinical Social Worker  [facility] [phone]

## 2021-06-14 NOTE — Telephone Encounter (Signed)
Copied from CRM [id number]. Topic: General - Other >> Jun 14, 2021 11:56 AM McGill, Darlina Rumpf wrote: Reason for CRM: Pt calling back to follow up on his CPAP equipment stated discussed with PCP at last appt.

## 2021-06-15 NOTE — Telephone Encounter (Signed)
I faxed documentation today to [facility]. KW

## 2021-06-16 ENCOUNTER — Other Ambulatory Visit: Payer: Self-pay | Admitting: *Deleted

## 2021-06-16 DIAGNOSIS — R911 Solitary pulmonary nodule: Secondary | ICD-10-CM

## 2021-06-16 NOTE — Progress Notes (Signed)
CT chest for lung nodule was ordered for 1 year 06/02/20.   Patient CT order had expired and a new CT order was requested.  New CT chest order placed for lung nodule.   LOV 06/02/20-  Instructions  I am glad you are doing well with regard to your breathing I have reviewed your CT scan which shows tiny lung nodule which is most likely benign   However given your smoking history we would like to monitor this.  CT scan without contrast in 1 year for lung nodule Follow-up in clinic after CT scan

## 2021-06-29 ENCOUNTER — Other Ambulatory Visit: Payer: Medicaid Other

## 2021-07-04 ENCOUNTER — Ambulatory Visit (HOSPITAL_COMMUNITY)
Admission: RE | Admit: 2021-07-04 | Discharge: 2021-07-04 | Disposition: A | Discharge status: Home / Self Care | Payer: Medicaid Other | Source: Ambulatory Visit | Attending: Pulmonary Disease | Admitting: Pulmonary Disease

## 2021-07-04 ENCOUNTER — Other Ambulatory Visit: Payer: Self-pay

## 2021-07-04 ENCOUNTER — Encounter (HOSPITAL_COMMUNITY): Payer: Self-pay

## 2021-07-04 DIAGNOSIS — R911 Solitary pulmonary nodule: Secondary | ICD-10-CM | POA: Insufficient documentation

## 2021-07-06 ENCOUNTER — Ambulatory Visit: Payer: Medicaid Other | Admitting: Family Medicine

## 2021-07-06 ENCOUNTER — Telehealth: Payer: Self-pay | Admitting: Pulmonary Disease

## 2021-07-06 NOTE — Telephone Encounter (Signed)
Spoke to patient, who is requesting CT results from 07/04/2021.  Dr. Isaiah Serge, please advise. Thanks

## 2021-07-06 NOTE — Telephone Encounter (Signed)
Called and spoke with Patient.  Dr. Mannam's results given.  Understanding stated.  Nothing further at this time. 

## 2021-07-06 NOTE — Telephone Encounter (Signed)
CT shows that the lung nodule has resolved.  There are no other lung abnormalities.

## 2021-07-12 ENCOUNTER — Ambulatory Visit (INDEPENDENT_AMBULATORY_CARE_PROVIDER_SITE_OTHER): Payer: Self-pay | Admitting: Family Medicine

## 2021-07-12 ENCOUNTER — Other Ambulatory Visit: Payer: Self-pay

## 2021-07-12 ENCOUNTER — Encounter: Payer: Self-pay | Admitting: Family Medicine

## 2021-07-12 VITALS — BP 132/87 | HR 94 | Temp 98.4°F | Resp 16 | Wt 238.1 lb

## 2021-07-12 DIAGNOSIS — F418 Other specified anxiety disorders: Secondary | ICD-10-CM | POA: Insufficient documentation

## 2021-07-12 DIAGNOSIS — E785 Hyperlipidemia, unspecified: Secondary | ICD-10-CM

## 2021-07-12 DIAGNOSIS — E559 Vitamin D deficiency, unspecified: Secondary | ICD-10-CM

## 2021-07-12 DIAGNOSIS — G4733 Obstructive sleep apnea (adult) (pediatric): Secondary | ICD-10-CM

## 2021-07-12 DIAGNOSIS — Z87898 Personal history of other specified conditions: Secondary | ICD-10-CM | POA: Insufficient documentation

## 2021-07-12 DIAGNOSIS — R002 Palpitations: Secondary | ICD-10-CM

## 2021-07-12 DIAGNOSIS — R4589 Other symptoms and signs involving emotional state: Secondary | ICD-10-CM | POA: Insufficient documentation

## 2021-07-12 DIAGNOSIS — I10 Essential (primary) hypertension: Secondary | ICD-10-CM

## 2021-07-12 MED ORDER — BUPROPION HCL ER (XL) 150 MG PO TB24: 150.0000 mg | ORAL_TABLET | Freq: Every day | ORAL | 1 refills | Status: DC

## 2021-07-12 NOTE — Assessment & Plan Note (Signed)
Chronic, stable Continue to report some palpitations Recommend focus on diet and hydration Previous EKG WDL Pt reports less stressors since change in jobs

## 2021-07-12 NOTE — Assessment & Plan Note (Signed)
Repeat vit d level Has been taking OTC supplement

## 2021-07-12 NOTE — Assessment & Plan Note (Signed)
Pt reports he discontinued effexor d/t GI side effects Will start wellbutrin 150 sr daily Discussed potential side effects, incl GI upset, sexual dysfunction, increased anxiety, and SI Discussed that it can take 6-8 weeks to reach full efficacy Contracted for safety - no SI/HI Discussed synergistic effects of medications and therapy

## 2021-07-12 NOTE — Assessment & Plan Note (Signed)
Continue to recommend and use DASH diet Continue medications, norvasc 10, chlorthalidone 50mg ,  to assist with BP <130/<80 Focus on increasing exercise and work on weight reduction to decrease BMI Chronic, stable Pt denies SOB, DOE or LE edema

## 2021-07-12 NOTE — Assessment & Plan Note (Signed)
Repeat A1c, long standing hx of prediabetes Continue to recommend balanced, lower carb meals. Smaller meal size, adding snacks. Choosing water as drink of choice and increasing purposeful exercise.

## 2021-07-12 NOTE — Progress Notes (Signed)
Established patient visit   Patient: Nathan Nelson   DOB: May 19, 1985   36 y.o. Male  MRN: [id number] Visit Date: 07/12/2021  Today's healthcare provider: Gwyneth Sprout, FNP   Chief Complaint  Patient presents with   follow-up chronic disease   Subjective    HPI  Hypertension, follow-up  BP Readings from Last 3 Encounters:  07/12/21 132/87  05/06/21 123/86  04/19/21 (!) 141/91   Wt Readings from Last 3 Encounters:  07/12/21 238 lb 1.6 oz (108 kg)  05/06/21 236 lb 3.2 oz (107.1 kg)  04/19/21 239 lb 8 oz (108.6 kg)     He was last seen for hypertension 3 months ago.  BP at that visit was 141/91. Management since that visit includes DASH Diet. Continue current treatment.  He reports excellent compliance with treatment. He is not having side effects.  He is following a Regular diet. He is exercising.  Outside blood pressures are 130/80. Symptoms: No chest pain No chest pressure  No palpitations No syncope  No dyspnea No orthopnea  No paroxysmal nocturnal dyspnea No lower extremity edema   Pertinent labs: Lab Results  Component Value Date   CHOL 239 (H) 03/11/2021   HDL 36 (L) 03/11/2021   LDLCALC 184 (H) 03/11/2021   TRIG 106 03/11/2021   CHOLHDL 6.6 (H) 03/11/2021   Lab Results  Component Value Date   NA 140 03/11/2021   K 4.0 03/11/2021   CREATININE 1.21 03/11/2021   EGFR 80 03/11/2021   GLUCOSE 95 03/11/2021   TSH 0.785 08/06/2020     The ASCVD Risk score (Arnett DK, et al., 2019) failed to calculate for the following reasons:   The 2019 ASCVD risk score is only valid for ages 55 to 82   ---------------------------------------------------------------------------------------------------  Lipid/Cholesterol, Follow-up  Last lipid panel Other pertinent labs  Lab Results  Component Value Date   CHOL 239 (H) 03/11/2021   HDL 36 (L) 03/11/2021   LDLCALC 184 (H) 03/11/2021   TRIG 106 03/11/2021   CHOLHDL 6.6 (H) 03/11/2021   Lab Results   Component Value Date   ALT 31 08/06/2020   AST 23 08/06/2020   PLT 416 08/06/2020   TSH 0.785 08/06/2020     He was last seen for this 3 months ago.  Management since that visit includes start pravastatin 52m once every day. Reports that he is only taking Red Yeast.  He reports excellent compliance with treatment. He is not having side effects.   Symptoms: No chest pain No chest pressure/discomfort  No dyspnea No lower extremity edema  No numbness or tingling of extremity No orthopnea  No palpitations No paroxysmal nocturnal dyspnea  No speech difficulty No syncope    The ASCVD Risk score (Arnett DK, et al., 2019) failed to calculate for the following reasons:   The 2019 ASCVD risk score is only valid for ages 446to 742 ---------------------------------------------------------------------------------------------------  Follow up for Vitamin D  The patient was last seen for this 3 months ago. Changes made at last visit include take OTC vitamin D3 2,000 units once a day. .Marland Kitchen He reports excellent compliance with treatment. He feels that condition is Improved. He is not having side effects.   -----------------------------------------------------------------------------------------  Medications: Outpatient Medications Prior to Visit  Medication Sig   amLODipine (NORVASC) 10 MG tablet Take 1 tablet by mouth once daily   chlorthalidone (HYGROTON) 50 MG tablet Take 1 tablet (50 mg total) by mouth daily.  cyclobenzaprine (FLEXERIL) 5 MG tablet Take 1 tablet (5 mg total) by mouth 3 (three) times daily as needed for muscle spasms.   LORazepam (ATIVAN) 0.5 MG tablet Take 0.5 tablets (0.25 mg total) by mouth daily as needed for anxiety.   [DISCONTINUED] pravastatin (PRAVACHOL) 20 MG tablet Take 1 tablet (20 mg total) by mouth daily.   [DISCONTINUED] venlafaxine XR (EFFEXOR XR) 75 MG 24 hr capsule Take 1 capsule (75 mg total) by mouth daily.   No facility-administered medications  prior to visit.    Review of Systems     Objective    BP 132/87 (BP Location: Left Arm, Patient Position: Sitting, Cuff Size: Large)    Pulse 94    Temp 98.4 F (36.9 C) (Oral)    Resp 16    Wt 238 lb 1.6 oz (108 kg)    BMI 40.87 kg/m    Physical Exam Vitals and nursing note reviewed.  Constitutional:      Appearance: Normal appearance. He is obese.  HENT:     Head: Normocephalic and atraumatic.  Eyes:     Pupils: Pupils are equal, round, and reactive to light.  Cardiovascular:     Rate and Rhythm: Normal rate and regular rhythm.     Pulses: Normal pulses.     Heart sounds: Normal heart sounds.  Pulmonary:     Effort: Pulmonary effort is normal.     Breath sounds: Normal breath sounds.  Musculoskeletal:        General: Normal range of motion.     Cervical back: Normal range of motion.  Skin:    General: Skin is warm and dry.     Capillary Refill: Capillary refill takes less than 2 seconds.  Neurological:     General: No focal deficit present.     Mental Status: He is alert and oriented to person, place, and time. Mental status is at baseline.  Psychiatric:        Mood and Affect: Mood normal.        Behavior: Behavior normal.        Thought Content: Thought content normal.        Judgment: Judgment normal.     No results found for any visits on 07/12/21.  Assessment & Plan     Problem List Items Addressed This Visit       Cardiovascular and Mediastinum   Hypertension - Primary    Continue to recommend and use DASH diet Continue medications, norvasc 10, chlorthalidone 65m,  to assist with BP <130/<80 Focus on increasing exercise and work on weight reduction to decrease BMI Chronic, stable Pt denies SOB, DOE or LE edema      Relevant Orders   Comprehensive metabolic panel     Respiratory   OSA (obstructive sleep apnea)    Has not started on CPAP due to change in job OOB cost was >$800 Encourage follow up and conversation if he cannot get the equipment  that he needs        Endocrine   History of prediabetes    Repeat A1c, long standing hx of prediabetes Continue to recommend balanced, lower carb meals. Smaller meal size, adding snacks. Choosing water as drink of choice and increasing purposeful exercise.       Relevant Orders   Hemoglobin A1c     Other   Anxiety about health    Pt reports he discontinued effexor d/t GI side effects Will start wellbutrin 150 sr daily Discussed potential side  effects, incl GI upset, sexual dysfunction, increased anxiety, and SI Discussed that it can take 6-8 weeks to reach full efficacy Contracted for safety - no SI/HI Discussed synergistic effects of medications and therapy       Relevant Medications   buPROPion (WELLBUTRIN XL) 150 MG 24 hr tablet   Hyperlipidemia    Patient has been using red yeast rice BID and metamucil 4 in 1, to assist with HLD Continue to focus on diet and exercise as well Repeat FLP recommend diet low in saturated fat and regular exercise - 30 min at least 5 times per week       Relevant Orders   Lipid panel   Palpitations    Chronic, stable Continue to report some palpitations Recommend focus on diet and hydration Previous EKG WDL Pt reports less stressors since change in jobs      Relevant Orders   Comprehensive metabolic panel   Vitamin D deficiency    Repeat vit d level Has been taking OTC supplement       Relevant Orders   Vitamin D (25 hydroxy)     Return in about 8 weeks (around 09/06/2021).      Vonna Kotyk, FNP, have reviewed all documentation for this visit. The documentation on 07/12/21 for the exam, diagnosis, procedures, and orders are all accurate and complete.    Gwyneth Sprout, [place] [phone] (phone) [phone] (fax)  [place]

## 2021-07-12 NOTE — Assessment & Plan Note (Signed)
Has not started on CPAP due to change in job OOB cost was >$800 Encourage follow up and conversation if he cannot get the equipment that he needs

## 2021-07-12 NOTE — Assessment & Plan Note (Signed)
Patient has been using red yeast rice BID and metamucil 4 in 1, to assist with HLD Continue to focus on diet and exercise as well Repeat FLP recommend diet low in saturated fat and regular exercise - 30 min at least 5 times per week

## 2021-07-17 ENCOUNTER — Other Ambulatory Visit: Payer: Self-pay | Admitting: Family Medicine

## 2021-07-17 DIAGNOSIS — F410 Panic disorder [episodic paroxysmal anxiety] without agoraphobia: Secondary | ICD-10-CM

## 2021-07-18 ENCOUNTER — Encounter: Payer: Self-pay | Admitting: Family Medicine

## 2021-07-18 MED ORDER — ALPRAZOLAM 0.25 MG PO TABS
0.2500 mg | ORAL_TABLET | ORAL | 0 refills | Status: DC
Start: 2021-07-18 — End: 2021-09-05

## 2021-07-19 ENCOUNTER — Encounter: Payer: Self-pay | Admitting: Emergency Medicine

## 2021-07-19 ENCOUNTER — Emergency Department: Payer: Self-pay

## 2021-07-19 ENCOUNTER — Emergency Department
Admission: EM | Admit: 2021-07-19 | Discharge: 2021-07-19 | Disposition: A | Discharge status: Home / Self Care | Payer: Self-pay | Attending: Emergency Medicine | Admitting: Emergency Medicine

## 2021-07-19 ENCOUNTER — Other Ambulatory Visit: Payer: Self-pay

## 2021-07-19 DIAGNOSIS — Z20822 Contact with and (suspected) exposure to covid-19: Secondary | ICD-10-CM | POA: Insufficient documentation

## 2021-07-19 DIAGNOSIS — J029 Acute pharyngitis, unspecified: Secondary | ICD-10-CM | POA: Insufficient documentation

## 2021-07-19 DIAGNOSIS — M791 Myalgia, unspecified site: Secondary | ICD-10-CM | POA: Insufficient documentation

## 2021-07-19 DIAGNOSIS — R5383 Other fatigue: Secondary | ICD-10-CM | POA: Insufficient documentation

## 2021-07-19 LAB — RESP PANEL BY RT-PCR (FLU A&B, COVID) ARPGX2
Influenza A by PCR: NEGATIVE
Influenza B by PCR: NEGATIVE
SARS Coronavirus 2 by RT PCR: NEGATIVE

## 2021-07-19 MED ORDER — AMOXICILLIN 500 MG PO CAPS: 500.0000 mg | ORAL_CAPSULE | Freq: Two times a day (BID) | ORAL | 0 refills | Status: DC

## 2021-07-19 NOTE — Discharge Instructions (Addendum)
Your chest x-ray was reassuring, your COVID and flu tests were negative, please take the antibiotics for possible strep throat ?

## 2021-07-19 NOTE — ED Triage Notes (Signed)
C/o fever, chills, body aches x 1 days.  Has not medicated for fever today. ? ?AAOx3.  Skin warm and dry. NAD ?

## 2021-07-19 NOTE — ED Notes (Signed)
Pt provided discharge instructions and prescription information. Pt was given the opportunity to ask questions and questions were answered. Discharge signature not obtained in the setting of the COVID-19 pandemic in order to reduce high touch surfaces.  ° °

## 2021-07-19 NOTE — ED Provider Triage Note (Signed)
?  Emergency Medicine Provider Triage Evaluation Note ? ?Nathan Nelson , a 35 y.o.male,  was evaluated in triage.  Pt complains of flulike symptoms.  Reports sore throat, body aches, decreased energy for the past 24 hours.  Fever of 102 at home ? ? ?Review of Systems  ?Positive: Fever/chills, body aches, sore throat ?Negative: Denies chest pain, abdominal pain, urinary symptoms ? ?Physical Exam  ?There were no vitals filed for this visit. ?Gen:   Awake, no distress   ?Resp:  Normal effort  ?MSK:   Moves extremities without difficulty  ?Other:  Swollen tonsils bilaterally, with exudates ? ?Medical Decision Making  ?Given the patient's initial medical screening exam, the following diagnostic evaluation has been ordered. The patient will be placed in the appropriate treatment space, once one is available, to complete the evaluation and treatment. I have discussed the plan of care with the patient and I have advised the patient that an ED physician or mid-level practitioner will reevaluate their condition after the test results have been received, as the results may give them additional insight into the type of treatment they may need.  ? ? ?Diagnostics: Respiratory panel, strep PCR. ? ?Treatments: none immediately ?  ?Varney Daily, PA ?07/19/21 1330 ? ?

## 2021-07-20 ENCOUNTER — Encounter: Payer: Self-pay | Admitting: Physician Assistant

## 2021-07-20 ENCOUNTER — Ambulatory Visit (INDEPENDENT_AMBULATORY_CARE_PROVIDER_SITE_OTHER): Payer: Self-pay | Admitting: Physician Assistant

## 2021-07-20 VITALS — BP 147/98 | HR 97 | Temp 97.9°F | Ht 64.0 in | Wt 230.2 lb

## 2021-07-20 DIAGNOSIS — J029 Acute pharyngitis, unspecified: Secondary | ICD-10-CM

## 2021-07-20 NOTE — Progress Notes (Signed)
? ?Acute Office Visit ? ?Subjective:  ? ? Patient ID: Nathan Nelson, male    DOB: 1985/05/18, 36 y.o.   MRN: [id number] ? ?Cc. Sore throat, fever ? ?HPI ?Nathan Nelson presented to the ED yesterday for treatment of a sore throat, fatigue, body aches, fevers since Monday. He reports a throat and nose swab, and was d/c with Amoxicillin BID x 10 days.  ?Reports some improvement since starting antibiotics. Not taking anything for pain. Denies shortness of breath. Reports some chest pain, central, tender to touch, when coughing. ? ? ?Past Medical History:  ?Diagnosis Date  ? Anxiety   ? Costochondritis 06/11/2014  ? Hypertension   ? Right shoulder injury 09/28/2015  ? ? ?Past Surgical History:  ?Procedure Laterality Date  ? KNEE SURGERY    ? ? ?Family History  ?Problem Relation Age of Onset  ? Hypertension Mother   ? Diabetes Father   ? Hypertension Father   ? ? ?Social History  ? ?Socioeconomic History  ? Marital status: Married  ?  Spouse name: Not on file  ? Number of children: Not on file  ? Years of education: Not on file  ? Highest education level: Not on file  ?Occupational History  ? Not on file  ?Tobacco Use  ? Smoking status: Former  ?  Types: Cigarettes  ?  Quit date: 03/02/2014  ?  Years since quitting: 7.3  ? Smokeless tobacco: Never  ?Substance and Sexual Activity  ? Alcohol use: Yes  ?  Alcohol/week: 0.0 standard drinks  ?  Comment: rarely  ? Drug use: No  ? Sexual activity: Not on file  ?Other Topics Concern  ? Not on file  ?Social History Narrative  ? Not on file  ? ?Social Determinants of Health  ? ?Financial Resource Strain: Not on file  ?Food Insecurity: No Food Insecurity  ? Worried About Charity fundraiser in the Last Year: Never true  ? Ran Out of Food in the Last Year: Never true  ?Transportation Needs: No Transportation Needs  ? Lack of Transportation (Medical): No  ? Lack of Transportation (Non-Medical): No  ?Physical Activity: Not on file  ?Stress: No Stress Concern Present  ? Feeling of Stress : Only a  little  ?Social Connections: Not on file  ?Intimate Partner Violence: Not on file  ? ? ?Outpatient Medications Prior to Visit  ?Medication Sig Dispense Refill  ? ALPRAZolam (XANAX) 0.25 MG tablet Take 1 tablet (0.25 mg total) by mouth See admin instructions. Take 1 tablet for anxiety emergencies or panic attacks. Continue to recommend daily controller medication to assist anxiety. Seek use of physiatry or counseling for assistance with anxiety. Call 9-1-1 in case of emergency. Warning this medication can be addictive and is not designed to be used every day. 15 tablet 0  ? amLODipine (NORVASC) 10 MG tablet Take 1 tablet by mouth once daily 90 tablet 3  ? amoxicillin (AMOXIL) 500 MG capsule Take 1 capsule (500 mg total) by mouth 2 (two) times daily. 20 capsule 0  ? buPROPion (WELLBUTRIN XL) 150 MG 24 hr tablet Take 1 tablet (150 mg total) by mouth daily. 30 tablet 1  ? chlorthalidone (HYGROTON) 50 MG tablet Take 1 tablet (50 mg total) by mouth daily. 90 tablet 3  ? cyclobenzaprine (FLEXERIL) 5 MG tablet Take 1 tablet (5 mg total) by mouth 3 (three) times daily as needed for muscle spasms. 30 tablet 1  ? ?No facility-administered medications prior to visit.  ? ? ?  Allergies  ?Allergen Reactions  ? Lisinopril Other (See Comments)  ?  Tightness in chest  ? ? ?Review of Systems  ?Constitutional:  Positive for fatigue and fever.  ?HENT:  Positive for congestion, rhinorrhea, sinus pain and sore throat.   ?Respiratory:  Positive for cough. Negative for shortness of breath.   ?Cardiovascular:  Negative for chest pain, palpitations and leg swelling.  ?Neurological:  Negative for dizziness and headaches.  ? ?   ?Objective:  ?  ?Physical Exam ?Constitutional:   ?   General: He is awake.  ?   Appearance: He is well-developed.  ?HENT:  ?   Head: Normocephalic.  ?   Right Ear: Tympanic membrane normal.  ?   Left Ear: Tympanic membrane normal.  ?   Nose: Congestion and rhinorrhea present.  ?   Mouth/Throat:  ?   Mouth: Mucous  membranes are moist.  ?   Pharynx: Oropharyngeal exudate and posterior oropharyngeal erythema present.  ?Eyes:  ?   Conjunctiva/sclera: Conjunctivae normal.  ?Cardiovascular:  ?   Rate and Rhythm: Normal rate and regular rhythm.  ?   Heart sounds: Normal heart sounds.  ?Pulmonary:  ?   Effort: Pulmonary effort is normal.  ?   Breath sounds: Normal breath sounds.  ?Skin: ?   General: Skin is warm.  ?Neurological:  ?   Mental Status: He is alert and oriented to person, place, and time.  ?Psychiatric:     ?   Attention and Perception: Attention normal.     ?   Mood and Affect: Mood normal.     ?   Speech: Speech normal.     ?   Behavior: Behavior is cooperative.  ? ? ?There were no vitals taken for this visit. ?Wt Readings from Last 3 Encounters:  ?07/19/21 238 lb 1.6 oz (108 kg)  ?07/12/21 238 lb 1.6 oz (108 kg)  ?05/06/21 236 lb 3.2 oz (107.1 kg)  ? ? ?Health Maintenance Due  ?Topic Date Due  ? HIV Screening  Never done  ? Hepatitis C Screening  Never done  ? COVID-19 Vaccine (3 - Booster for Pfizer series) 12/31/2019  ? ? ?There are no preventive care reminders to display for this patient. ? ? ?Lab Results  ?Component Value Date  ? TSH 0.785 08/06/2020  ? ?Lab Results  ?Component Value Date  ? WBC 9.3 08/06/2020  ? HGB 14.7 08/06/2020  ? HCT 43.6 08/06/2020  ? MCV 84 08/06/2020  ? PLT 416 08/06/2020  ? ?Lab Results  ?Component Value Date  ? NA 140 03/11/2021  ? K 4.0 03/11/2021  ? CO2 29 03/11/2021  ? GLUCOSE 95 03/11/2021  ? BUN 13 03/11/2021  ? CREATININE 1.21 03/11/2021  ? BILITOT 0.3 08/06/2020  ? ALKPHOS 103 08/06/2020  ? AST 23 08/06/2020  ? ALT 31 08/06/2020  ? PROT 7.9 08/06/2020  ? ALBUMIN 4.7 03/11/2021  ? CALCIUM 10.4 (H) 03/11/2021  ? ANIONGAP 22 (H) 03/18/2019  ? EGFR 80 03/11/2021  ? ?Lab Results  ?Component Value Date  ? CHOL 239 (H) 03/11/2021  ? ?Lab Results  ?Component Value Date  ? HDL 36 (L) 03/11/2021  ? ?Lab Results  ?Component Value Date  ? [place] 184 (H) 03/11/2021  ? ?Lab Results   ?Component Value Date  ? TRIG 106 03/11/2021  ? ?Lab Results  ?Component Value Date  ? CHOLHDL 6.6 (H) 03/11/2021  ? ?Lab Results  ?Component Value Date  ? HGBA1C (H) 09/14/2009  ?  5.7 ?(  NOTE)                                                                       According to the ADA Clinical Practice Recommendations for 2011, when HbA1c is used as a screening test:   >=6.5%   Diagnostic of Diabetes Mellitus           (if abnormal result ? is confirmed)  5.7-6.4%   Increased risk of developing Diabetes Mellitus  References:Diagnosis and Classification of Diabetes Mellitus,Diabetes RPRX,4585,92(TWKMQ 1):S62-S69 and Standards of Medical Care in         Diabetes - 2011,Diabetes KMMN,8177,11  ?(Suppl 1):S11-S61.  ? ? ?   ?Assessment & Plan:  ? ?Pharyngitis ?Exam consistent w/ strep pharyngitis, continue amoxicillin.  ?Recommended tylenol/advil for fever/sore throat. ?Offered swab, as I only see a flu/covid swab in ED, but pt states his throat was already swabbed in ED. ? Deferring as do not want pt to incur duplicate charges. Treatment is correct regardless. ? ?I, Mikey Kirschner, PA-C have reviewed all documentation for this visit. The documentation on  07/20/2021  for the exam, diagnosis, procedures, and orders are all accurate and complete. ? ?Mikey Kirschner, PA-C ?[facility] ?[id number] ?[place], [place], [address] ?Office: [phone] ?Fax: [phone]  ? ?

## 2021-07-26 NOTE — ED Provider Notes (Signed)
? ?  [facility] ?Provider Note ? ? ? Event Date/Time  ? First MD Initiated Contact with Patient 07/19/21 1400   ?  (approximate) ? ? ?History  ? ?Generalized Body Aches ? ? ?HPI ? ?Nathan Nelson is a 36 y.o. male who presents with complaints of body aches, sore throat, fatigue.  Symptoms started over the last 24 to 48 hours.  No sick contacts reported ?  ? ? ?Physical Exam  ? ?Triage Vital Signs: ?ED Triage Vitals  ?Enc Vitals Group  ?   BP 07/19/21 1331 (!) 145/113  ?   Pulse Rate 07/19/21 1331 (!) 116  ?   Resp 07/19/21 1331 17  ?   Temp 07/19/21 1331 99.7 ?F (37.6 ?C)  ?   Temp Source 07/19/21 1331 Oral  ?   SpO2 07/19/21 1331 94 %  ?   Weight 07/19/21 1323 108 kg (238 lb 1.6 oz)  ?   Height 07/19/21 1323 1.626 m (5\' 4" )  ?   Head Circumference --   ?   Peak Flow --   ?   Pain Score 07/19/21 1323 7  ?   Pain Loc --   ?   Pain Edu? --   ?   Excl. in GC? --   ? ? ?Most recent vital signs: ?Vitals:  ? 07/19/21 1331  ?BP: (!) 145/113  ?Pulse: (!) 116  ?Resp: 17  ?Temp: 99.7 ?F (37.6 ?C)  ?SpO2: 94%  ? ? ? ?General: Awake, no distress.  ?CV:  Good peripheral perfusion.  ?Resp:  Normal effort.  CTA bilaterally ?Abd:  No distention.  ?Other:  Pharynx: Mild erythema bilaterally, no exudate ? ? ?ED Results / Procedures / Treatments  ? ?Labs ?(all labs ordered are listed, but only abnormal results are displayed) ?Labs Reviewed  ?RESP PANEL BY RT-PCR (FLU A&B, COVID) [id number]  ? ? ? ?EKG ? ? ? ? ?RADIOLOGY ?Chest x-ray viewed and interpreted by me, no acute abnormality ? ? ? ?PROCEDURES: ? ?Critical Care performed:  ? ?Procedures ? ? ?MEDICATIONS ORDERED IN ED: ?Medications - No data to display ? ? ?IMPRESSION / MDM / ASSESSMENT AND PLAN / ED COURSE  ?I reviewed the triage vital signs and the nursing notes. ? ? ? ?Patient presents with upper respiratory infection symptoms,   Flu and COVID tests are negative. ? ?Chest x-ray without evidence of pneumonia. ? ?If any erythema on exam suggest possible  pharyngitis bacterial versus viral, will treat with amoxicillin, outpatient follow-up recommended ? ? ? ?  ? ? ?FINAL CLINICAL IMPRESSION(S) / ED DIAGNOSES  ? ?Final diagnoses:  ?Pharyngitis, unspecified etiology  ? ? ? ?Rx / DC Orders  ? ?ED Discharge Orders   ? ?      Ordered  ?  amoxicillin (AMOXIL) 500 MG capsule  2 times daily       ? 07/19/21 1445  ? ?  ?  ? ?  ? ? ? ?Note:  This document was prepared using Dragon voice recognition software and may include unintentional dictation errors. ?  ?09/18/21, MD ?07/26/21 [phone] ? ?

## 2021-09-05 ENCOUNTER — Other Ambulatory Visit: Payer: Self-pay | Admitting: Family Medicine

## 2021-09-06 MED ORDER — ALPRAZOLAM 0.25 MG PO TABS: 0.2500 mg | ORAL_TABLET | ORAL | 0 refills | Status: DC

## 2021-09-12 DIAGNOSIS — Z7952 Long term (current) use of systemic steroids: Secondary | ICD-10-CM | POA: Diagnosis not present

## 2021-09-12 DIAGNOSIS — Z79899 Other long term (current) drug therapy: Secondary | ICD-10-CM | POA: Diagnosis not present

## 2021-09-12 DIAGNOSIS — M25511 Pain in right shoulder: Secondary | ICD-10-CM | POA: Diagnosis not present

## 2021-09-12 DIAGNOSIS — Z87891 Personal history of nicotine dependence: Secondary | ICD-10-CM | POA: Diagnosis not present

## 2021-09-12 DIAGNOSIS — F419 Anxiety disorder, unspecified: Secondary | ICD-10-CM | POA: Diagnosis not present

## 2021-09-12 DIAGNOSIS — S4991XA Unspecified injury of right shoulder and upper arm, initial encounter: Secondary | ICD-10-CM | POA: Diagnosis not present

## 2021-09-12 DIAGNOSIS — Z6839 Body mass index (BMI) 39.0-39.9, adult: Secondary | ICD-10-CM | POA: Diagnosis not present

## 2021-09-12 DIAGNOSIS — Z888 Allergy status to other drugs, medicaments and biological substances status: Secondary | ICD-10-CM | POA: Diagnosis not present

## 2021-09-12 DIAGNOSIS — I10 Essential (primary) hypertension: Secondary | ICD-10-CM | POA: Diagnosis not present

## 2021-09-23 ENCOUNTER — Encounter: Payer: Self-pay | Admitting: Family Medicine

## 2021-09-23 ENCOUNTER — Ambulatory Visit (INDEPENDENT_AMBULATORY_CARE_PROVIDER_SITE_OTHER): Payer: BC Managed Care – PPO | Admitting: Family Medicine

## 2021-09-23 VITALS — BP 116/82 | HR 91 | Temp 98.6°F | Resp 16 | Wt 229.3 lb

## 2021-09-23 DIAGNOSIS — M25511 Pain in right shoulder: Secondary | ICD-10-CM | POA: Diagnosis not present

## 2021-09-23 DIAGNOSIS — R002 Palpitations: Secondary | ICD-10-CM | POA: Diagnosis not present

## 2021-09-23 DIAGNOSIS — E559 Vitamin D deficiency, unspecified: Secondary | ICD-10-CM | POA: Diagnosis not present

## 2021-09-23 DIAGNOSIS — I10 Essential (primary) hypertension: Secondary | ICD-10-CM | POA: Diagnosis not present

## 2021-09-23 DIAGNOSIS — E785 Hyperlipidemia, unspecified: Secondary | ICD-10-CM | POA: Diagnosis not present

## 2021-09-23 MED ORDER — BACLOFEN 10 MG PO TABS: 10.0000 mg | ORAL_TABLET | Freq: Every day | ORAL | 0 refills | Status: DC

## 2021-09-23 MED ORDER — IBUPROFEN 600 MG PO TABS: 600.0000 mg | ORAL_TABLET | Freq: Four times a day (QID) | ORAL | 0 refills | Status: DC

## 2021-09-23 NOTE — Progress Notes (Signed)
?  ? ? ?Established patient visit ? ? ?Patient: Nathan Nelson   DOB: [birth year]   36 y.o. Male  MRN: [id number] ?Visit Date: 09/23/2021 ? ?Today's healthcare provider: Jacky Kindle, FNP  ?Re Introduced to nurse practitioner role and practice setting.  All questions answered.  Discussed provider/patient relationship and expectations. ? ?[employer] as a Neurosurgeon for Jacky Kindle, FNP.,have documented all relevant documentation on the behalf of Jacky Kindle, FNP,as directed by  Jacky Kindle, FNP while in the presence of Jacky Kindle, FNP.  ?Chief Complaint  ?Patient presents with  ? Shoulder Pain  ? ?Subjective  ?  ?Shoulder Pain  ?The pain is present in the back. This is a new problem. The current episode started 1 to 4 weeks ago (patient states that he was seen in ED and was told it was a trap muscle and instructed patient to try ibuprofen). There has been no history of extremity trauma. The problem occurs constantly. The problem has been unchanged. The quality of the pain is described as aching. Associated symptoms include joint swelling and a limited range of motion. Pertinent negatives include no fever, inability to bear weight, itching or joint locking. The symptoms are aggravated by activity. He has tried NSAIDS and chondroitin for the symptoms.   ? ? ?Medications: ?Outpatient Medications Prior to Visit  ?Medication Sig  ? ALPRAZolam (XANAX) 0.25 MG tablet Take 1 tablet (0.25 mg total) by mouth See admin instructions. Take 1 tablet for anxiety emergencies or panic attacks. Continue to recommend daily controller medication to assist anxiety. Seek use of psychiatry or counseling for assistance with anxiety. Call 9-1-1 in case of emergency. Warning this medication can be addictive and is not designed to be used every day.  ? amLODipine (NORVASC) 10 MG tablet Take 1 tablet by mouth once daily  ? amoxicillin (AMOXIL) 500 MG capsule Take 1 capsule (500 mg total) by mouth 2 (two) times daily.  ?  buPROPion (WELLBUTRIN XL) 150 MG 24 hr tablet Take 1 tablet (150 mg total) by mouth daily.  ? chlorthalidone (HYGROTON) 50 MG tablet Take 1 tablet (50 mg total) by mouth daily.  ? cyclobenzaprine (FLEXERIL) 5 MG tablet Take 1 tablet (5 mg total) by mouth 3 (three) times daily as needed for muscle spasms.  ? ?No facility-administered medications prior to visit.  ? ? ?Review of Systems  ?Constitutional:  Negative for fever.  ?Skin:  Negative for itching.  ? ? ?  Objective  ?  ?BP 116/82   Pulse 91   Temp 98.6 ?F (37 ?C) (Oral)   Resp 16   Wt 229 lb 4.8 oz (104 kg)   BMI 39.36 kg/m?  ? ? ?Physical Exam ?Vitals and nursing note reviewed.  ?Constitutional:   ?   Appearance: Normal appearance. He is obese.  ?HENT:  ?   Head: Normocephalic and atraumatic.  ?Eyes:  ?   Pupils: Pupils are equal, round, and reactive to light.  ?Cardiovascular:  ?   Rate and Rhythm: Normal rate and regular rhythm.  ?   Pulses: Normal pulses.  ?   Heart sounds: Normal heart sounds.  ?Pulmonary:  ?   Effort: Pulmonary effort is normal.  ?   Breath sounds: Normal breath sounds.  ?Musculoskeletal:  ?   Cervical back: Normal range of motion.  ?   Comments: Ongoing R shoulder/upper back pain since prior to ER visit on 09/12/2021; did not take multi-modal therapies, only did Ibuprofen   ?  Skin: ?   General: Skin is warm and dry.  ?   Capillary Refill: Capillary refill takes less than 2 seconds.  ?Neurological:  ?   General: No focal deficit present.  ?   Mental Status: He is alert and oriented to person, place, and time. Mental status is at baseline.  ?Psychiatric:     ?   Mood and Affect: Mood normal.     ?   Behavior: Behavior normal.     ?   Thought Content: Thought content normal.     ?   Judgment: Judgment normal.  ?  ? ?No results found for any visits on 09/23/21. ? Assessment & Plan  ?  ? ?Problem List Items Addressed This Visit   ? ?  ? Other  ? Acute pain of right shoulder - Primary  ?  Acute pain, R posterior shoulder ?Hx of fall 2+ years  ago ?Imaging completed at [facility] on 5/1- negative ?Was d/c'd on NSAID, po and topical, lidocaine and muscle relaxant ?Patient did not understand d/c instructions and only did NSAID ?Pain continues; he lifts heavy items at work daily and has not been able to rest R shoulder area ?Will refer to sports medicine and repeat Rx for NSAID and muscle relaxant; have provided written names of other topical NSAID and patches  ? ?  ?  ? Relevant Medications  ? baclofen (LIORESAL) 10 MG tablet  ? ibuprofen (ADVIL) 600 MG tablet  ? Other Relevant Orders  ? Ambulatory referral to Sports Medicine  ? ? ? ?Return if symptoms worsen or fail to improve.  ?   ? ?I, Jacky Kindle, FNP, have reviewed all documentation for this visit. The documentation on 09/23/21 for the exam, diagnosis, procedures, and orders are all accurate and complete. ? ? ? ?Jacky Kindle, FNP  ?[facility] ?[phone] (phone) ?[phone] (fax) ? ?[facility] ?

## 2021-09-23 NOTE — Assessment & Plan Note (Signed)
Acute pain, R posterior shoulder ?Hx of fall 2+ years ago ?Imaging completed at [facility] on 5/1- negative ?Was d/c'd on NSAID, po and topical, lidocaine and muscle relaxant ?Patient did not understand d/c instructions and only did NSAID ?Pain continues; he lifts heavy items at work daily and has not been able to rest R shoulder area ?Will refer to sports medicine and repeat Rx for NSAID and muscle relaxant; have provided written names of other topical NSAID and patches  ?

## 2021-09-24 LAB — COMPREHENSIVE METABOLIC PANEL
ALT: 24 IU/L (ref 0–44)
AST: 15 IU/L (ref 0–40)
Albumin/Globulin Ratio: 1.3 (ref 1.2–2.2)
Albumin: 4.7 g/dL (ref 4.0–5.0)
Alkaline Phosphatase: 100 IU/L (ref 44–121)
BUN/Creatinine Ratio: 15 (ref 9–20)
BUN: 18 mg/dL (ref 6–20)
Bilirubin Total: 0.3 mg/dL (ref 0.0–1.2)
CO2: 29 mmol/L (ref 20–29)
Calcium: 10.3 mg/dL — ABNORMAL HIGH (ref 8.7–10.2)
Chloride: 99 mmol/L (ref 96–106)
Creatinine, Ser: 1.2 mg/dL (ref 0.76–1.27)
Globulin, Total: 3.5 g/dL (ref 1.5–4.5)
Glucose: 85 mg/dL (ref 70–99)
Potassium: 3.4 mmol/L — ABNORMAL LOW (ref 3.5–5.2)
Sodium: 143 mmol/L (ref 134–144)
Total Protein: 8.2 g/dL (ref 6.0–8.5)
eGFR: 80 mL/min/{1.73_m2} (ref >59)

## 2021-09-24 LAB — LIPID PANEL
Chol/HDL Ratio: 6.2 ratio — ABNORMAL HIGH (ref 0.0–5.0)
Cholesterol, Total: 237 mg/dL — ABNORMAL HIGH (ref 100–199)
HDL: 38 mg/dL — ABNORMAL LOW (ref >39)
LDL Chol Calc (NIH): 171 mg/dL — ABNORMAL HIGH (ref 0–99)
Triglycerides: 150 mg/dL — ABNORMAL HIGH (ref 0–149)
VLDL Cholesterol Cal: 28 mg/dL (ref 5–40)

## 2021-09-24 LAB — VITAMIN D 25 HYDROXY (VIT D DEFICIENCY, FRACTURES): Vit D, 25-Hydroxy: 25.6 ng/mL — ABNORMAL LOW (ref 30.0–100.0)

## 2021-09-24 LAB — HEMOGLOBIN A1C
Est. average glucose Bld gHb Est-mCnc: 117 mg/dL
Hgb A1c MFr Bld: 5.7 % — ABNORMAL HIGH (ref 4.8–5.6)

## 2021-09-26 ENCOUNTER — Encounter: Payer: Self-pay | Admitting: Family Medicine

## 2021-09-28 ENCOUNTER — Ambulatory Visit (INDEPENDENT_AMBULATORY_CARE_PROVIDER_SITE_OTHER): Payer: BC Managed Care – PPO | Admitting: Family Medicine

## 2021-09-28 VITALS — BP 132/96 | Ht 64.0 in | Wt 228.0 lb

## 2021-09-28 DIAGNOSIS — M25511 Pain in right shoulder: Secondary | ICD-10-CM | POA: Diagnosis not present

## 2021-09-28 MED ORDER — MELOXICAM 15 MG PO TABS: 15.0000 mg | ORAL_TABLET | Freq: Every day | ORAL | 1 refills | Status: DC

## 2021-09-28 NOTE — Patient Instructions (Signed)
Thank you for coming to see me today. It was a pleasure. Today we talked about:  ? ?We have given you exercises for your shoulder.  Do these at least 5 times a week.  Don't push through severe pain, but a little soreness is okay. ? ?Take meloxicam every day for the next 5 days, then daily as needed.  Take with food.  Do not take with ibuprofen, advil, or aleve. ? ?Avoid heavy lifting at the gym. ? ?Please follow-up with [place] in 4-6 weeks or sooner as needed. ? ?If you have any questions or concerns, please do not hesitate to call the office at [phone]. ? ?Best,  ? ?Luis Abed, DO ?[facility]  ?

## 2021-09-28 NOTE — Assessment & Plan Note (Signed)
Has some slight pain with testing of his cuff musculature, however no specific weakness.  His most positive and provocative maneuver was O'Brien's.  Could consider a labral tear as the cause, but would likely be nonsurgical treatment to start given his symptoms are not incredibly severe at this time.  We will opt for more conservative management at the beginning.  We will give him home exercises for rotator cuff strengthening and have him use meloxicam daily as needed, advised to avoid other NSAIDs while taking.  We will have him follow-up in 4 to 6 weeks and if not making improvement could consider further imaging including an ultrasound versus an MR arthrogram if labral pathology remains the lead diagnosis and he is failing outpatient management.  Could also consider corticosteroid injection in the future for subacromial bursa versus biceps tendon if more concerning for a SLAP tear. ?

## 2021-09-28 NOTE — Progress Notes (Signed)
? ?  Nathan Nelson is a 36 y.o. male who presents to [facility] today for the following: ? 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 ? ?PMH reviewed.  ?ROS as above. ?Medications reviewed. ? ?Exam:  ?BP (!) 132/96   Ht 5\' 4"  (1.626 m)   Wt 228 lb (103.4 kg)   BMI 39.14 kg/m?  ?Gen: Well NAD ?MSK: ? ?Right Shoulder: ?Inspection reveals no obvious deformity, atrophy, or asymmetry b/l. No bruising. No swelling ?Palpation is normal with no TTP over [facility] joint or bicipital groove b/l.  There is some slight tenderness and increased hypertonicity in the right upper trap, but does not reproduce his pain. ?Full ROM in flexion, abduction, internal/external rotation b/l, he has pain at the extreme of flexion and abduction. ?NV intact distally b/l ?Normal scapular function observed b/l ?Special Tests:  ?- Impingement: Neg Hawkins ?- Supraspinatous: Negative empty can ?- Infraspinatous/Teres Minor: 5/5 strength with ER ?- Subscapularis: 5/5 strength with IR ?- Biceps tendon: Negative Speeds ?- Labrum: Positive O'Briens ?- AC Joint: Negative cross arm ?-Positive apprehension test ? ? ?No results found. ? ? ?Assessment and Plan: ?1) Acute pain of right shoulder ?Has some slight pain with testing of his cuff musculature, however no specific weakness.  His most positive and provocative maneuver was O'Brien's.  Could consider a labral tear as the cause, but would likely be nonsurgical treatment to start given his symptoms are not incredibly severe at this time.  We will opt for more conservative management at the beginning.  We will  give him home exercises for rotator cuff strengthening and have him use meloxicam daily as needed, advised to avoid other NSAIDs while taking.  We will have him follow-up in 4 to 6 weeks and if not making improvement could consider further imaging including an ultrasound versus an MR arthrogram if labral pathology remains the lead diagnosis and he is failing outpatient management.  Could also consider corticosteroid injection in the future for subacromial bursa versus biceps tendon if more concerning for a SLAP tear. ? ? ?[facility], D.O.  ?PGY-4 [facility]  ?09/28/2021 3:42 PM ? ?Addendum:  I was the preceptor for this visit and available for immediate consultation.  09/30/2021 MD [facility] ? ?

## 2021-10-01 ENCOUNTER — Other Ambulatory Visit: Payer: Self-pay | Admitting: Family Medicine

## 2021-10-01 DIAGNOSIS — F418 Other specified anxiety disorders: Secondary | ICD-10-CM

## 2021-10-03 ENCOUNTER — Other Ambulatory Visit: Payer: Self-pay | Admitting: Family Medicine

## 2021-10-03 DIAGNOSIS — M25511 Pain in right shoulder: Secondary | ICD-10-CM

## 2021-10-04 ENCOUNTER — Other Ambulatory Visit: Payer: Self-pay | Admitting: Family Medicine

## 2021-10-04 ENCOUNTER — Encounter: Payer: Self-pay | Admitting: Family Medicine

## 2021-10-04 DIAGNOSIS — M25511 Pain in right shoulder: Secondary | ICD-10-CM

## 2021-10-27 ENCOUNTER — Other Ambulatory Visit: Payer: Self-pay | Admitting: Family Medicine

## 2021-10-28 MED ORDER — ALPRAZOLAM 0.25 MG PO TABS: 0.2500 mg | ORAL_TABLET | ORAL | 0 refills | Status: DC

## 2021-12-03 ENCOUNTER — Other Ambulatory Visit: Payer: Self-pay | Admitting: Family Medicine

## 2021-12-05 MED ORDER — ALPRAZOLAM 0.25 MG PO TABS: 0.2500 mg | ORAL_TABLET | ORAL | 0 refills | Status: DC

## 2022-01-14 ENCOUNTER — Other Ambulatory Visit: Payer: Self-pay | Admitting: Family Medicine

## 2022-01-18 MED ORDER — ALPRAZOLAM 0.25 MG PO TABS: 0.2500 mg | ORAL_TABLET | ORAL | 0 refills | Status: DC

## 2022-02-11 ENCOUNTER — Encounter: Payer: Self-pay | Admitting: Family Medicine

## 2022-03-03 ENCOUNTER — Ambulatory Visit: Payer: Self-pay

## 2022-03-03 ENCOUNTER — Other Ambulatory Visit: Payer: Self-pay | Admitting: Family Medicine

## 2022-03-03 MED ORDER — ALPRAZOLAM 0.25 MG PO TABS
0.2500 mg | ORAL_TABLET | ORAL | 0 refills | Status: DC | PRN
Start: 2022-03-03 — End: 2022-03-08

## 2022-03-03 MED ORDER — ALPRAZOLAM 0.25 MG PO TABS: 0.2500 mg | ORAL_TABLET | ORAL | 0 refills | Status: DC

## 2022-03-03 NOTE — Telephone Encounter (Signed)
  Chief Complaint: Unclear med instructions Symptoms:  Frequency:  Pertinent Negatives: Patient denies  Disposition: [] ED /[] Urgent Care (no appt availability in office) / [] Appointment(In office/virtual)/ []  [facility] [facility]/ [] Home Care/ [] Refused Recommended Disposition /[] [facility]/ [x]  Follow-up with PCP Additional Notes: Called pharmacy back. Pharmacy needs additional information regarding orders for Xanax rx as indicated below. IS med PRN or daily? Max dosage? Please retunr pharmacy call to advise.  Summary: Xanax daily instruction clarification   Nathan Nelson with [facility] on [address] called in to get specific directions on the patients Xanax. It just start take for anxiety emergencies or panic attacks and she needs max daily dosage instructions. Please assist patient further       Dose: 0.25 mg Route: Oral Frequency: See admin instructions  Dispense Quantity: 15 tablet Refills: 0        Sig: Take 1 tablet (0.25 mg total) by mouth See admin instructions. Take 1 tablet for anxiety emergencies or panic attacks. Continue to recommend daily controller medication to assist anxiety. Seek use of psychiatry or counseling for assistance with anxiety. Call 9-1-1 in case of emergency. Warning this medication can be addictive and is not designed to be used every day.       Start Date: 03/03/22 End Date: --  Written Date: 03/03/22 Expiration Date: 08/30/22  Original Order:  ALPRAZolam Duanne Moron) 0.25 MG tablet [[id number]]   Reason for Disposition  [1] Pharmacy calling with prescription question AND [2] triager unable to answer question  Answer Assessment - Initial Assessment Questions 1. NAME of MEDICINE: "What medicine(s) are you calling about?"     Xanax 2. QUESTION: "What is your question?" (e.g., double dose of medicine, side effect)     Instructions needed 3. PRESCRIBER: "Who prescribed the medicine?" Reason: if prescribed by specialist, call should be referred to that  group.     Tally Joe 4. SYMPTOMS: "Do you have any symptoms?" If Yes, ask: "What symptoms are you having?"  "How bad are the symptoms (e.g., mild, moderate, severe)      5. PREGNANCY:  "Is there any chance that you are pregnant?" "When was your last menstrual period?"  Protocols used: Medication Question Call-A-AH

## 2022-03-06 ENCOUNTER — Telehealth: Payer: Self-pay

## 2022-03-06 NOTE — Telephone Encounter (Signed)
Spoke with [employer] from [employer]. They need clarification on the xanax prescription since is a control substance. On the sig it says to take 1 tablet by mouth as needed. Alyse Low states it needs  a daily dosage and days supply. Please advise

## 2022-03-08 ENCOUNTER — Other Ambulatory Visit: Payer: Self-pay | Admitting: Family Medicine

## 2022-03-08 MED ORDER — ALPRAZOLAM 0.25 MG PO TABS: 0.2500 mg | ORAL_TABLET | ORAL | 0 refills | Status: DC | PRN

## 2022-04-17 ENCOUNTER — Other Ambulatory Visit: Payer: Self-pay | Admitting: Family Medicine

## 2022-04-17 ENCOUNTER — Encounter: Payer: Self-pay | Admitting: Family Medicine

## 2022-04-17 DIAGNOSIS — I10 Essential (primary) hypertension: Secondary | ICD-10-CM

## 2022-04-17 NOTE — Telephone Encounter (Signed)
Pt calling to follow up on his medication refill. Pt stated he is out of his medication .   Please advise.

## 2022-04-18 NOTE — Progress Notes (Unsigned)
Established patient visit   Patient: Nathan Nelson   DOB: [birth year]   36 y.o. Male  MRN: [id number] Visit Date: 04/19/2022  Today's healthcare provider: Gwyneth Sprout, FNP  Re Introduced to nurse practitioner role and practice setting.  All questions answered.  Discussed provider/patient relationship and expectations.  I,Yasmin Bronaugh J Gergory Biello,acting as a scribe for Gwyneth Sprout, FNP.,have documented all relevant documentation on the behalf of Gwyneth Sprout, FNP,as directed by  Gwyneth Sprout, FNP while in the presence of Gwyneth Sprout, FNP.   Chief Complaint  Patient presents with   Anxiety   Depression   Hypertension   Subjective    HPI  Hypertension, follow-up  BP Readings from Last 3 Encounters:  04/19/22 130/88  09/28/21 (!) 132/96  09/23/21 116/82   Wt Readings from Last 3 Encounters:  04/19/22 226 lb (102.5 kg)  09/28/21 228 lb (103.4 kg)  09/23/21 229 lb 4.8 oz (104 kg)     He was last seen for hypertension 6 months ago.  BP at that visit was 132/96. Management since that visit includes no changes.  He reports excellent compliance with treatment. He is not having side effects.  He is following a Regular diet. He is exercising. He does not smoke.  Use of agents associated with hypertension: none.   Outside blood pressures are around 133/85. Symptoms: No chest pain No chest pressure  Yes palpitations No syncope  No dyspnea No orthopnea  Yes paroxysmal nocturnal dyspnea No lower extremity edema   Pertinent labs Lab Results  Component Value Date   CHOL 237 (H) 09/23/2021   HDL 38 (L) 09/23/2021   LDLCALC 171 (H) 09/23/2021   TRIG 150 (H) 09/23/2021   CHOLHDL 6.2 (H) 09/23/2021   Lab Results  Component Value Date   NA 143 09/23/2021   K 3.4 (L) 09/23/2021   CREATININE 1.20 09/23/2021   EGFR 80 09/23/2021   GLUCOSE 85 09/23/2021   TSH 0.785 08/06/2020     The ASCVD Risk score (Arnett DK, et al., 2019) failed to calculate for the following  reasons:   The 2019 ASCVD risk score is only valid for ages 49 to 69  Anxiety/Depression, Follow-up  He was last seen for anxiety 6 months ago. Changes made at last visit include no changes.   He reports excellent compliance with treatment. He reports excellent tolerance of treatment. He is not having side effects.   He feels his anxiety is moderate and Improved since last visit.  Symptoms: No chest pain Yes difficulty concentrating  No dizziness No fatigue  No feelings of losing control No insomnia  No irritable Yes palpitations  No panic attacks Yes racing thoughts  No shortness of breath No sweating  No tremors/shakes    GAD-7 Results    04/19/2022    1:36 PM 06/10/2021    3:46 PM 04/06/2020   10:11 AM  GAD-7 Generalized Anxiety Disorder Screening Tool  1. Feeling Nervous, Anxious, or on Edge 0 1 3  2. Not Being Able to Stop or Control Worrying 3 0 3  3. Worrying Too Much About Different Things 3 0 1  4. Trouble Relaxing 3 1 0  5. Being So Restless it's Hard To Sit Still 0 0 1  6. Becoming Easily Annoyed or Irritable _0 7. Feeling Afraid As If Something Awful Might Happen _1 Total GAD-7 Score _2 Difficulty At Work, Home, or Getting  Along With Others? Not difficult at all Somewhat difficult Somewhat difficult    PHQ-9 Scores    04/19/2022    1:35 PM 07/12/2021    4:21 PM 06/10/2021    3:44 PM  PHQ9 SCORE ONLY  PHQ-9 Total Score _0 ---------------------------------------------------------------------------------------------------  ---------------------------------------------------------------------------------------------------   Medications: Outpatient Medications Prior to Visit  Medication Sig   ALPRAZolam (XANAX) 0.25 MG tablet Take 1 tablet (0.25 mg total) by mouth every three (3) days as needed for anxiety. Take every 3rd day for anxiety as needed. Rx should last 90 days.   amLODipine (NORVASC) 10 MG tablet Take 1 tablet by mouth once  daily   chlorthalidone (HYGROTON) 50 MG tablet Take 1 tablet by mouth once daily   [DISCONTINUED] amoxicillin (AMOXIL) 500 MG capsule Take 1 capsule (500 mg total) by mouth 2 (two) times daily.   [DISCONTINUED] baclofen (LIORESAL) 10 MG tablet Take 1 tablet (10 mg total) by mouth at bedtime.   [DISCONTINUED] buPROPion (WELLBUTRIN XL) 150 MG 24 hr tablet Take 1 tablet by mouth once daily   [DISCONTINUED] cyclobenzaprine (FLEXERIL) 5 MG tablet Take 1 tablet (5 mg total) by mouth 3 (three) times daily as needed for muscle spasms.   [DISCONTINUED] meloxicam (MOBIC) 15 MG tablet Take 1 tablet (15 mg total) by mouth daily.   No facility-administered medications prior to visit.    Review of Systems    Objective    BP 130/88 (BP Location: Left Arm, Patient Position: Sitting, Cuff Size: Large)   Pulse 86   Resp 16   Ht _1  (1.626 m)   Wt 226 lb (102.5 kg)   SpO2 99%   BMI 38.79 kg/m   Physical Exam Vitals and nursing note reviewed.  Constitutional:      Appearance: Normal appearance. He is obese.  HENT:     Head: Normocephalic and atraumatic.  Eyes:     Pupils: Pupils are equal, round, and reactive to light.  Cardiovascular:     Rate and Rhythm: Normal rate and regular rhythm.     Pulses: Normal pulses.     Heart sounds: Normal heart sounds.  Pulmonary:     Effort: Pulmonary effort is normal.     Breath sounds: Normal breath sounds.  Musculoskeletal:        General: Normal range of motion.     Cervical back: Normal range of motion.  Skin:    General: Skin is warm and dry.     Capillary Refill: Capillary refill takes less than 2 seconds.  Neurological:     General: No focal deficit present.     Mental Status: He is alert and oriented to person, place, and time. Mental status is at baseline.  Psychiatric:        Mood and Affect: Mood normal.        Behavior: Behavior normal.        Thought Content: Thought content normal.        Judgment: Judgment normal.    No results  found for any visits on 04/19/22.  Assessment & Plan     Problem List Items Addressed This Visit       Cardiovascular and Mediastinum   Hypertension    Chronic, at goal of <140/<90 Continue Norvasc 10 mg, Hygroton 50 mg Due for lab f/u      Relevant Orders   Comprehensive metabolic panel   CBC     Other   Encounter for hepatitis C screening test  for low risk patient    Low risk screen Treatable, and curable. If left untreated Hep C can lead to cirrhosis and liver failure. Encourage routine testing; recommend repeat testing if risk factors change.       Relevant Orders   Hepatitis C Antibody   Encounter for screening for human immunodeficiency virus (HIV)    Low risk screen Consented; encouraged to "know your status" Recommend repeat screen if risk factors change       Relevant Orders   HIV antibody (with reflex)   GAD (generalized anxiety disorder)    Chronic, variable Request to try increase in daily medications will increase wellbutrin from 150 to 300 mg Will f/u at 3 months for additional A1c check vs 6 week f/u with holidays Continue low dose xanax to assist at 0.25 mg PRN max of 30 tabs/month       Relevant Medications   buPROPion (WELLBUTRIN XL) 300 MG 24 hr tablet   Other Relevant Orders   Comprehensive metabolic panel   CBC   Hyperlipidemia    Chronic, previously elevated Is working on diet/exercise Repeat NFLP Goal LDL <100      Relevant Orders   Lipid panel   Prediabetes - Primary    Chronic, stable Working on diet/exercise Continue to recommend balanced, lower carb meals. Smaller meal size, adding snacks. Choosing water as drink of choice and increasing purposeful exercise. Repeat A1c      Relevant Orders   Hemoglobin A1c   Vitamin D deficiency    Chronic, currently untreated Anxiety worsening Request for repeat labs       Relevant Orders   Vitamin D (25 hydroxy)   Return in about 3 months (around 07/19/2022) for chonic disease  management.     Vonna Kotyk, FNP, have reviewed all documentation for this visit. The documentation on 04/19/22 for the exam, diagnosis, procedures, and orders are all accurate and complete.  Gwyneth Sprout, [place] [phone] (phone) [phone] (fax)  [place]

## 2022-04-18 NOTE — Telephone Encounter (Signed)
Requested Prescriptions  Pending Prescriptions Disp Refills   amLODipine (NORVASC) 10 MG tablet [Pharmacy Med Name: amLODIPine Besylate 10 MG Oral Tablet] 90 tablet 0    Sig: Take 1 tablet by mouth once daily     Cardiovascular: Calcium Channel Blockers 2 Failed - 04/17/2022  3:30 PM      Failed - Last BP in normal range    BP Readings from Last 1 Encounters:  09/28/21 (!) 132/96         Failed - Valid encounter within last 6 months    Recent Outpatient Visits           6 months ago Acute pain of right shoulder   [facility] Jacky Kindle, FNP   9 months ago Pharyngitis, unspecified etiology   [facility] Ok Edwards, [place], PA-C   9 months ago Primary hypertension   [facility] Merita Norton T, FNP   10 months ago Anxiety and depression   [facility] Merita Norton T, FNP   11 months ago GAD (generalized anxiety disorder)   [facility] Ok Edwards, [place], PA-C       Future Appointments             Tomorrow Jacky Kindle, FNP [facility]            Passed - Last Heart Rate in normal range    Pulse Readings from Last 1 Encounters:  09/23/21 91          chlorthalidone (HYGROTON) 50 MG tablet [Pharmacy Med Name: Chlorthalidone 50 MG Oral Tablet] 90 tablet 0    Sig: Take 1 tablet by mouth once daily     Cardiovascular: Diuretics - Thiazide Failed - 04/17/2022  3:30 PM      Failed - Cr in normal range and within 180 days    Creatinine, Ser  Date Value Ref Range Status  09/23/2021 1.20 0.76 - 1.27 mg/dL Final   Creatinine,U  Date Value Ref Range Status  09/14/2009  mg/dL Final   622.2 (NOTE)  Cutoff Values for Urine Drug Screen:        Drug Class           Cutoff (ng/mL)        Amphetamines            1000        Barbiturates             200        Cocaine Metabolites      300        Benzodiazepines          200        Methadone                 300        Opiates                  2000        Phencyclidine             25        Propoxyphene             300        Marijuana Metabolites     50  For medical purposes only.         Failed - K in normal range and within 180 days    Potassium  Date Value Ref Range Status  09/23/2021 3.4 (L)  3.5 - 5.2 mmol/L Final         Failed - Na in normal range and within 180 days    Sodium  Date Value Ref Range Status  09/23/2021 143 134 - 144 mmol/L Final         Failed - Last BP in normal range    BP Readings from Last 1 Encounters:  09/28/21 (!) 132/96         Failed - Valid encounter within last 6 months    Recent Outpatient Visits           6 months ago Acute pain of right shoulder   [facility] Jacky Kindle, FNP   9 months ago Pharyngitis, unspecified etiology   [facility] Ok Edwards, [place], PA-C   9 months ago Primary hypertension   [facility] Jacky Kindle, FNP   10 months ago Anxiety and depression   [facility] Jacky Kindle, FNP   11 months ago GAD (generalized anxiety disorder)   [facility] Alfredia Ferguson, PA-C       Future Appointments             Tomorrow Jacky Kindle, FNP [facility], [facility]

## 2022-04-19 ENCOUNTER — Ambulatory Visit: Payer: BC Managed Care – PPO | Admitting: Family Medicine

## 2022-04-19 ENCOUNTER — Encounter: Payer: Self-pay | Admitting: Family Medicine

## 2022-04-19 VITALS — BP 130/88 | HR 86 | Resp 16 | Ht 64.0 in | Wt 226.0 lb

## 2022-04-19 DIAGNOSIS — Z1159 Encounter for screening for other viral diseases: Secondary | ICD-10-CM | POA: Insufficient documentation

## 2022-04-19 DIAGNOSIS — E785 Hyperlipidemia, unspecified: Secondary | ICD-10-CM | POA: Diagnosis not present

## 2022-04-19 DIAGNOSIS — E559 Vitamin D deficiency, unspecified: Secondary | ICD-10-CM | POA: Diagnosis not present

## 2022-04-19 DIAGNOSIS — F411 Generalized anxiety disorder: Secondary | ICD-10-CM

## 2022-04-19 DIAGNOSIS — I10 Essential (primary) hypertension: Secondary | ICD-10-CM | POA: Diagnosis not present

## 2022-04-19 DIAGNOSIS — Z114 Encounter for screening for human immunodeficiency virus [HIV]: Secondary | ICD-10-CM

## 2022-04-19 DIAGNOSIS — R7303 Prediabetes: Secondary | ICD-10-CM | POA: Diagnosis not present

## 2022-04-19 MED ORDER — BUPROPION HCL ER (XL) 300 MG PO TB24: 300.0000 mg | ORAL_TABLET | Freq: Every day | ORAL | 3 refills | Status: DC

## 2022-04-19 NOTE — Assessment & Plan Note (Signed)
Chronic, previously elevated Is working on diet/exercise Repeat NFLP Goal LDL <100

## 2022-04-19 NOTE — Assessment & Plan Note (Signed)
Chronic, currently untreated Anxiety worsening Request for repeat labs

## 2022-04-19 NOTE — Assessment & Plan Note (Signed)
Chronic, stable Working on diet/exercise Continue to recommend balanced, lower carb meals. Smaller meal size, adding snacks. Choosing water as drink of choice and increasing purposeful exercise. Repeat A1c

## 2022-04-19 NOTE — Assessment & Plan Note (Signed)
Chronic, variable Request to try increase in daily medications will increase wellbutrin from 150 to 300 mg Will f/u at 3 months for additional A1c check vs 6 week f/u with holidays Continue low dose xanax to assist at 0.25 mg PRN max of 30 tabs/month

## 2022-04-19 NOTE — Assessment & Plan Note (Signed)
Low risk screen ?Consented; encouraged to "know your status" ?Recommend repeat screen if risk factors change ? ?

## 2022-04-19 NOTE — Assessment & Plan Note (Signed)
Chronic, at goal of <140/<90 Continue Norvasc 10 mg, [place] 50 mg Due for lab f/u

## 2022-04-19 NOTE — Assessment & Plan Note (Signed)
Low risk screen Treatable, and curable. If left untreated Hep C can lead to cirrhosis and liver failure. Encourage routine testing; recommend repeat testing if risk factors change.  

## 2022-04-20 ENCOUNTER — Other Ambulatory Visit: Payer: Self-pay | Admitting: Family Medicine

## 2022-04-20 DIAGNOSIS — E559 Vitamin D deficiency, unspecified: Secondary | ICD-10-CM

## 2022-04-20 LAB — COMPREHENSIVE METABOLIC PANEL
ALT: 23 IU/L (ref 0–44)
AST: 20 IU/L (ref 0–40)
Albumin/Globulin Ratio: 1.4 (ref 1.2–2.2)
Albumin: 4.5 g/dL (ref 4.1–5.1)
Alkaline Phosphatase: 105 IU/L (ref 44–121)
BUN/Creatinine Ratio: 15 (ref 9–20)
BUN: 16 mg/dL (ref 6–20)
Bilirubin Total: 0.2 mg/dL (ref 0.0–1.2)
CO2: 27 mmol/L (ref 20–29)
Calcium: 10.1 mg/dL (ref 8.7–10.2)
Chloride: 96 mmol/L (ref 96–106)
Creatinine, Ser: 1.09 mg/dL (ref 0.76–1.27)
Globulin, Total: 3.2 g/dL (ref 1.5–4.5)
Glucose: 86 mg/dL (ref 70–99)
Potassium: 3.7 mmol/L (ref 3.5–5.2)
Sodium: 137 mmol/L (ref 134–144)
Total Protein: 7.7 g/dL (ref 6.0–8.5)
eGFR: 90 mL/min/{1.73_m2} (ref >59)

## 2022-04-20 LAB — HIV ANTIBODY (ROUTINE TESTING W REFLEX): HIV Screen 4th Generation wRfx: NONREACTIVE

## 2022-04-20 LAB — CBC
Hematocrit: 42.8 % (ref 37.5–51.0)
Hemoglobin: 14.7 g/dL (ref 13.0–17.7)
MCH: 28.8 pg (ref 26.6–33.0)
MCHC: 34.3 g/dL (ref 31.5–35.7)
MCV: 84 fL (ref 79–97)
Platelets: 504 10*3/uL — ABNORMAL HIGH (ref 150–450)
RBC: 5.1 x10E6/uL (ref 4.14–5.80)
RDW: 12.5 % (ref 11.6–15.4)
WBC: 10.5 10*3/uL (ref 3.4–10.8)

## 2022-04-20 LAB — HEPATITIS C ANTIBODY: Hep C Virus Ab: NONREACTIVE

## 2022-04-20 LAB — LIPID PANEL
Chol/HDL Ratio: 5.7 ratio — ABNORMAL HIGH (ref 0.0–5.0)
Cholesterol, Total: 199 mg/dL (ref 100–199)
HDL: 35 mg/dL — ABNORMAL LOW (ref >39)
LDL Chol Calc (NIH): 139 mg/dL — ABNORMAL HIGH (ref 0–99)
Triglycerides: 136 mg/dL (ref 0–149)
VLDL Cholesterol Cal: 25 mg/dL (ref 5–40)

## 2022-04-20 LAB — HEMOGLOBIN A1C
Est. average glucose Bld gHb Est-mCnc: 120 mg/dL
Hgb A1c MFr Bld: 5.8 % — ABNORMAL HIGH (ref 4.8–5.6)

## 2022-04-20 LAB — VITAMIN D 25 HYDROXY (VIT D DEFICIENCY, FRACTURES): Vit D, 25-Hydroxy: 20.1 ng/mL — ABNORMAL LOW (ref 30.0–100.0)

## 2022-04-20 MED ORDER — VITAMIN D (ERGOCALCIFEROL) 1.25 MG (50000 UNIT) PO CAPS: 50000.0000 [IU] | ORAL_CAPSULE | ORAL | 0 refills | Status: DC

## 2022-04-20 NOTE — Progress Notes (Signed)
A1c is slightly elevated; however, remains controlled with diet/exercise without medication. Cholesterol remains elevated but also improved. Continue to recommend balanced, lower carb meals. Smaller meal size, adding snacks. Choosing water as drink of choice and increasing purposeful exercise.  Recommend Vit D supplement weekly to assist.

## 2022-05-09 ENCOUNTER — Encounter: Payer: Self-pay | Admitting: Family Medicine

## 2022-05-10 ENCOUNTER — Emergency Department
Admission: EM | Admit: 2022-05-10 | Discharge: 2022-05-10 | Disposition: A | Discharge status: Home / Self Care | Payer: BC Managed Care – PPO | Attending: Emergency Medicine | Admitting: Emergency Medicine

## 2022-05-10 ENCOUNTER — Other Ambulatory Visit: Payer: Self-pay

## 2022-05-10 DIAGNOSIS — R109 Unspecified abdominal pain: Secondary | ICD-10-CM | POA: Diagnosis not present

## 2022-05-10 DIAGNOSIS — I10 Essential (primary) hypertension: Secondary | ICD-10-CM | POA: Diagnosis not present

## 2022-05-10 DIAGNOSIS — K529 Noninfective gastroenteritis and colitis, unspecified: Secondary | ICD-10-CM | POA: Diagnosis not present

## 2022-05-10 DIAGNOSIS — E876 Hypokalemia: Secondary | ICD-10-CM | POA: Insufficient documentation

## 2022-05-10 DIAGNOSIS — Z20822 Contact with and (suspected) exposure to covid-19: Secondary | ICD-10-CM | POA: Diagnosis not present

## 2022-05-10 LAB — COMPREHENSIVE METABOLIC PANEL
ALT: 27 U/L (ref 0–44)
AST: 19 U/L (ref 15–41)
Albumin: 4.1 g/dL (ref 3.5–5.0)
Alkaline Phosphatase: 82 U/L (ref 38–126)
Anion gap: 8 (ref 5–15)
BUN: 18 mg/dL (ref 6–20)
CO2: 29 mmol/L (ref 22–32)
Calcium: 9.1 mg/dL (ref 8.9–10.3)
Chloride: 98 mmol/L (ref 98–111)
Creatinine, Ser: 1.08 mg/dL (ref 0.61–1.24)
GFR, Estimated: >60 mL/min (ref >60)
Glucose, Bld: 99 mg/dL (ref 70–99)
Potassium: 2.9 mmol/L — ABNORMAL LOW (ref 3.5–5.1)
Sodium: 135 mmol/L (ref 135–145)
Total Bilirubin: 0.7 mg/dL (ref 0.3–1.2)
Total Protein: 8.7 g/dL — ABNORMAL HIGH (ref 6.5–8.1)

## 2022-05-10 LAB — URINALYSIS, ROUTINE W REFLEX MICROSCOPIC
Bacteria, UA: NONE SEEN
Bilirubin Urine: NEGATIVE
Glucose, UA: NEGATIVE mg/dL
Ketones, ur: NEGATIVE mg/dL
Leukocytes,Ua: NEGATIVE
Nitrite: NEGATIVE
Protein, ur: NEGATIVE mg/dL
Specific Gravity, Urine: 1.025 (ref 1.005–1.030)
Squamous Epithelial / HPF: NONE SEEN /HPF (ref 0–5)
pH: 5 (ref 5.0–8.0)

## 2022-05-10 LAB — CBC
HCT: 45.1 % (ref 39.0–52.0)
Hemoglobin: 15.1 g/dL (ref 13.0–17.0)
MCH: 28.4 pg (ref 26.0–34.0)
MCHC: 33.5 g/dL (ref 30.0–36.0)
MCV: 84.8 fL (ref 80.0–100.0)
Platelets: 409 10*3/uL — ABNORMAL HIGH (ref 150–400)
RBC: 5.32 MIL/uL (ref 4.22–5.81)
RDW: 13 % (ref 11.5–15.5)
WBC: 10.3 10*3/uL (ref 4.0–10.5)
nRBC: 0 % (ref 0.0–0.2)

## 2022-05-10 LAB — LIPASE, BLOOD: Lipase: 36 U/L (ref 11–51)

## 2022-05-10 LAB — RESP PANEL BY RT-PCR (RSV, FLU A&B, COVID)  RVPGX2
Influenza A by PCR: NEGATIVE
Influenza B by PCR: NEGATIVE
Resp Syncytial Virus by PCR: NEGATIVE
SARS Coronavirus 2 by RT PCR: NEGATIVE

## 2022-05-10 MED ORDER — POTASSIUM CHLORIDE CRYS ER 20 MEQ PO TBCR
40.0000 meq | EXTENDED_RELEASE_TABLET | Freq: Once | ORAL | Status: AC
  Administered 2022-05-10: 40 meq via ORAL
  Filled 2022-05-10: qty 2

## 2022-05-10 MED ORDER — LOPERAMIDE HCL 2 MG PO TABS: 2.0000 mg | ORAL_TABLET | Freq: Four times a day (QID) | ORAL | 0 refills | Status: DC | PRN

## 2022-05-10 MED ORDER — ONDANSETRON 4 MG PO TBDP: 4.0000 mg | ORAL_TABLET | Freq: Three times a day (TID) | ORAL | 0 refills | Status: DC | PRN

## 2022-05-10 NOTE — ED Provider Triage Note (Signed)
Emergency Medicine Provider Triage Evaluation Note  Nathan Nelson , a 36 y.o. male  was evaluated in triage.  Pt complains of nausea, vomiting and diarrhea.  + feverish and chills.    Review of Systems  Positive:  Negative: No abdominal pain  Physical Exam  BP 133/87 (BP Location: Left Arm)   Pulse 95   Temp 98.4 F (36.9 C) (Oral)   Resp 18   Ht 5\' 4"  (1.626 m)   Wt 102.5 kg   SpO2 97%   BMI 38.79 kg/m  Gen:   Awake, no distress    Resp:  Normal effort  Lungs clear MSK:   Moves extremities without difficulty  Other:  Abdomen nontender to palpation, BS x 4   Medical Decision Making  Medically screening exam initiated at 9:50 AM.  Appropriate orders placed.  RUSHI CHASEN was informed that the remainder of the evaluation will be completed by another provider, this initial triage assessment does not replace that evaluation, and the importance of remaining in the ED until their evaluation is complete.     Orion Modest, PA-C 05/10/22 [phone]

## 2022-05-10 NOTE — Discharge Instructions (Addendum)
-  I suspect that you have a viral gastroenteritis.  Your symptoms should resolve within the next few days.  -You may take the loperamide as needed for diarrhea.  You may take the ondansetron as needed for nausea/vomiting.  -Please review the educational material.  Recommend frequent hydration and eating potassium rich foods.  -Return to the emergency department anytime if you begin to experience any new or worsening symptoms.

## 2022-05-10 NOTE — ED Provider Notes (Signed)
[facility] Provider Note    Event Date/Time   First MD Initiated Contact with Patient 05/10/22 1458     (approximate)   History   Chief Complaint Diarrhea and Abdominal Pain   HPI Nathan Nelson is a 36 y.o. male, history of hypertension, anxiety, hyperlipidemia, OSA, presents to the emergency department for evaluation of abdominal pain/diarrhea that started yesterday.  He states that he has had more than 10 episodes of diarrhea, as well as persistent nausea without vomiting.  Reports some mild abdominal cramping, otherwise no significant pain.  Denies fever/chills, chest pain, shortness of breath, flank pain, hematochezia, hematemesis, rash/lesions, urinary symptoms, or dizziness/lightheadedness.  History Limitations: No limitations.        Physical Exam  Triage Vital Signs: ED Triage Vitals  Enc Vitals Group     BP 05/10/22 0949 133/87     Pulse Rate 05/10/22 0949 95     Resp 05/10/22 0949 18     Temp 05/10/22 0949 98.4 F (36.9 C)     Temp Source 05/10/22 0949 Oral     SpO2 05/10/22 0949 97 %     Weight 05/10/22 0946 225 lb 15.5 oz (102.5 kg)     Height 05/10/22 0946 5\' 4"  (1.626 m)     Head Circumference --      Peak Flow --      Pain Score 05/10/22 0945 6     Pain Loc --      Pain Edu? --      Excl. in [place]? --     Most recent vital signs: Vitals:   05/10/22 0949 05/10/22 1337  BP: 133/87 (!) 136/98  Pulse: 95 85  Resp: 18 16  Temp: 98.4 F (36.9 C) 98.8 F (37.1 C)  SpO2: 97% 95%    General: Awake, NAD.  Skin: Warm, dry. No rashes or lesions.  Eyes: PERRL. Conjunctivae normal.  CV: Good peripheral perfusion.  Resp: Normal effort.  Abd: Soft, non-tender. No distention.  Neuro: At baseline. No gross neurological deficits.  Musculoskeletal: Normal ROM of all extremities.   Physical Exam    ED Results / Procedures / Treatments  Labs (all labs ordered are listed, but only abnormal results are displayed) Labs Reviewed   COMPREHENSIVE METABOLIC PANEL - Abnormal; Notable for the following components:      Result Value   Potassium 2.9 (*)    Total Protein 8.7 (*)    All other components within normal limits  CBC - Abnormal; Notable for the following components:   Platelets 409 (*)    All other components within normal limits  URINALYSIS, ROUTINE W REFLEX MICROSCOPIC - Abnormal; Notable for the following components:   Color, Urine YELLOW (*)    APPearance CLEAR (*)    Hgb urine dipstick SMALL (*)    All other components within normal limits  RESP PANEL BY RT-PCR (RSV, FLU A&B, COVID)  RVPGX2  LIPASE, BLOOD     EKG N/A.    RADIOLOGY  ED Provider Interpretation: N/A.  No results found.  PROCEDURES:  Critical Care performed: N/A.  Procedures    MEDICATIONS ORDERED IN ED: Medications  potassium chloride SA (KLOR-CON M) CR tablet 40 mEq (40 mEq Oral Given 05/10/22 1521)     IMPRESSION / MDM / ASSESSMENT AND PLAN / ED COURSE  I reviewed the triage vital signs and the nursing notes.  Differential diagnosis includes, but is not limited to, gastroenteritis, colitis, enteritis, appendicitis, ureterolithiasis, diverticulitis, cholecystitis, pancreatitis.  ED Course Patient appears well, vitals within normal limits.  NAD.  CBC shows no leukocytosis or anemia.  Lipase unremarkable at 36.  Respiratory panel negative for COVID-19, influenza, or RSV.  CMP shows hypokalemia at 2.9, otherwise no electrolyte abnormalities, AKI, or transaminitis.  Provide him with oral supplementation of potassium.  Urinalysis shows no evidence of infection.  Assessment/Plan Presentation consistent with gastroenteritis.  No findings on physical exam to suggest any acute abdominal pathology.  I do not believe he would benefit from imaging.  Lab workup is reassuring, though he was hypokalemic likely due to the several episodes of diarrhea.  Provide him with p.o. supplementation  here today.  Will provide with a prescription for loperamide and ondansetron to help manage his symptoms.  Encouraged him to follow-up with his primary care provider as needed.  Will discharge.  Considered admission for this patient, but given his stable presentation and unremarkable workup, is unlikely benefit from admission.  Provided the patient with anticipatory guidance, return precautions, and educational material. Encouraged the patient to return to the emergency department at any time if they begin to experience any new or worsening symptoms. Patient expressed understanding and agreed with the plan.   Patient's presentation is most consistent with acute complicated illness / injury requiring diagnostic workup.       FINAL CLINICAL IMPRESSION(S) / ED DIAGNOSES   Final diagnoses:  Gastroenteritis     Rx / DC Orders   ED Discharge Orders          Ordered    loperamide (IMODIUM A-D) 2 MG tablet  4 times daily PRN        05/10/22 1516    ondansetron (ZOFRAN-ODT) 4 MG disintegrating tablet  Every 8 hours PRN        05/10/22 1516             Note:  This document was prepared using Dragon voice recognition software and may include unintentional dictation errors.   Varney Daily, [place] 05/10/22 Ninfa Linden    Minna Antis, MD 05/11/22 [phone]

## 2022-05-10 NOTE — ED Triage Notes (Signed)
Pt here with abd pain and diarrhea that started yesterday. Pt states he has had more than 10 episodes. Pt also states he is nauseous. Pt denies fevers.

## 2022-05-10 NOTE — Progress Notes (Deleted)
    I,Sha'taria Signa Cheek,acting as a Neurosurgeon for Jacky Kindle, FNP.,have documented all relevant documentation on the behalf of Jacky Kindle, FNP,as directed by  Jacky Kindle, FNP while in the presence of Jacky Kindle, FNP.  MyChart Video Visit    Virtual Visit via Video Note   This format is felt to be most appropriate for this patient at this time. Physical exam was limited by quality of the video and audio technology used for the visit.   Patient location: *** Provider location: [facility]  I discussed the limitations of evaluation and management by telemedicine and the availability of in person appointments. The patient expressed understanding and agreed to proceed.  Patient: Nathan Nelson   DOB: 07-29-1985   36 y.o. Male  MRN: [id number] Visit Date: 05/11/2022  Today's healthcare provider: Jacky Kindle, FNP   No chief complaint on file.  Subjective    Influenza        Medications: Outpatient Medications Prior to Visit  Medication Sig   Vitamin D, Ergocalciferol, (DRISDOL) 1.25 MG (50000 UNIT) CAPS capsule Take 1 capsule (50,000 Units total) by mouth every 7 (seven) days. Take with fat containing meal.   ALPRAZolam (XANAX) 0.25 MG tablet Take 1 tablet (0.25 mg total) by mouth every three (3) days as needed for anxiety. Take every 3rd day for anxiety as needed. Rx should last 90 days.   amLODipine (NORVASC) 10 MG tablet Take 1 tablet by mouth once daily   buPROPion (WELLBUTRIN XL) 300 MG 24 hr tablet Take 1 tablet (300 mg total) by mouth daily.   chlorthalidone (HYGROTON) 50 MG tablet Take 1 tablet by mouth once daily   loperamide (IMODIUM A-D) 2 MG tablet Take 1 tablet (2 mg total) by mouth 4 (four) times daily as needed for diarrhea or loose stools.   ondansetron (ZOFRAN-ODT) 4 MG disintegrating tablet Take 1 tablet (4 mg total) by mouth every 8 (eight) hours as needed for nausea or vomiting.   No facility-administered medications prior to visit.     Review of Systems  {Labs  Heme  Chem  Endocrine  Serology  Results Review (optional):23779}   Objective    There were no vitals taken for this visit.  {Show previous vital signs (optional):23777}   Physical Exam     Assessment & Plan     ***  No follow-ups on file.     I discussed the assessment and treatment plan with the patient. The patient was provided an opportunity to ask questions and all were answered. The patient agreed with the plan and demonstrated an understanding of the instructions.   The patient was advised to call back or seek an in-person evaluation if the symptoms worsen or if the condition fails to improve as anticipated.  I provided *** minutes of non-face-to-face time during this encounter.  {provider attestation***:1}  Jacky Kindle, FNP [facility] [phone] (phone) [phone] (fax)  [facility]

## 2022-05-11 ENCOUNTER — Telehealth (INDEPENDENT_AMBULATORY_CARE_PROVIDER_SITE_OTHER): Payer: BC Managed Care – PPO | Admitting: Family Medicine

## 2022-05-11 DIAGNOSIS — Z91199 Patient's noncompliance with other medical treatment and regimen due to unspecified reason: Secondary | ICD-10-CM

## 2022-05-12 NOTE — Progress Notes (Signed)
Patient cancelled appt <24 hours prior. He was not seen or examined.  Jacky Kindle, FNP  [facility] [address] [place], [place] [address] [phone] (phone) [phone] (fax) [facility]

## 2022-05-21 ENCOUNTER — Other Ambulatory Visit: Payer: Self-pay | Admitting: Family Medicine

## 2022-05-23 ENCOUNTER — Encounter: Payer: Self-pay | Admitting: Family Medicine

## 2022-05-23 ENCOUNTER — Other Ambulatory Visit: Payer: Self-pay | Admitting: Family Medicine

## 2022-05-30 ENCOUNTER — Ambulatory Visit: Payer: BC Managed Care – PPO | Admitting: Family Medicine

## 2022-05-30 NOTE — Progress Notes (Deleted)
      Established patient visit   Patient: Nathan Nelson   DOB: 1986/01/29   37 y.o. Male  MRN: [id number] Visit Date: 05/30/2022  Today's healthcare provider: Gwyneth Sprout, FNP   No chief complaint on file.  Subjective    HPI  Rash: Patient presents with a rash.  Symptoms have been present for {0-10:33138} {units:11}.  The rash is located on the {body part general:32401}. Since then it {has not:15037} spread to the {body part:32401}. Parent {has/not:15037} tried {med tried:12996} for initial treatment showing rash {change:13112}. Discomfort is {Severity:14456}. Patient {has/not:15037} fever. Recent illnesses: {illness:12986}. Sick contacts: {sick contacts:12987}   Medications: Outpatient Medications Prior to Visit  Medication Sig   Vitamin D, Ergocalciferol, (DRISDOL) 1.25 MG (50000 UNIT) CAPS capsule Take 1 capsule (50,000 Units total) by mouth every 7 (seven) days. Take with fat containing meal.   ALPRAZolam (XANAX) 0.25 MG tablet Take 1 tablet (0.25 mg total) by mouth every three (3) days as needed for anxiety. Take every 3rd day for anxiety as needed. Rx should last 90 days.   amLODipine (NORVASC) 10 MG tablet Take 1 tablet by mouth once daily   buPROPion (WELLBUTRIN XL) 300 MG 24 hr tablet Take 1 tablet (300 mg total) by mouth daily.   chlorthalidone (HYGROTON) 50 MG tablet Take 1 tablet by mouth once daily   loperamide (IMODIUM A-D) 2 MG tablet Take 1 tablet (2 mg total) by mouth 4 (four) times daily as needed for diarrhea or loose stools.   ondansetron (ZOFRAN-ODT) 4 MG disintegrating tablet Take 1 tablet (4 mg total) by mouth every 8 (eight) hours as needed for nausea or vomiting.   No facility-administered medications prior to visit.    Review of Systems  {Labs  Heme  Chem  Endocrine  Serology  Results Review (optional):23779}   Objective    There were no vitals taken for this visit. {Show previous vital signs (optional):23777}  Physical Exam  ***  No results  found for any visits on 05/30/22.  Assessment & Plan     ***  No follow-ups on file.      {provider attestation***:1}   Gwyneth Sprout, [place] [phone] (phone) [phone] (fax)  [place]

## 2022-06-03 ENCOUNTER — Other Ambulatory Visit: Payer: Self-pay | Admitting: Family Medicine

## 2022-06-05 MED ORDER — ALPRAZOLAM 0.25 MG PO TABS: 0.2500 mg | ORAL_TABLET | Freq: Every day | ORAL | 0 refills | Status: DC | PRN

## 2022-07-18 ENCOUNTER — Ambulatory Visit: Payer: BC Managed Care – PPO | Admitting: Family Medicine

## 2022-07-18 ENCOUNTER — Encounter: Payer: Self-pay | Admitting: Family Medicine

## 2022-07-18 VITALS — BP 124/88 | HR 99 | Temp 98.3°F | Ht 64.0 in | Wt 225.0 lb

## 2022-07-18 DIAGNOSIS — Z79899 Other long term (current) drug therapy: Secondary | ICD-10-CM

## 2022-07-18 DIAGNOSIS — E559 Vitamin D deficiency, unspecified: Secondary | ICD-10-CM

## 2022-07-18 DIAGNOSIS — I10 Essential (primary) hypertension: Secondary | ICD-10-CM | POA: Insufficient documentation

## 2022-07-18 DIAGNOSIS — F411 Generalized anxiety disorder: Secondary | ICD-10-CM

## 2022-07-18 DIAGNOSIS — R7303 Prediabetes: Secondary | ICD-10-CM

## 2022-07-18 DIAGNOSIS — E782 Mixed hyperlipidemia: Secondary | ICD-10-CM | POA: Diagnosis not present

## 2022-07-18 DIAGNOSIS — G4733 Obstructive sleep apnea (adult) (pediatric): Secondary | ICD-10-CM

## 2022-07-18 DIAGNOSIS — Z6838 Body mass index (BMI) 38.0-38.9, adult: Secondary | ICD-10-CM

## 2022-07-18 LAB — POCT GLYCOSYLATED HEMOGLOBIN (HGB A1C): Hemoglobin A1C: 5.7 % — AB (ref 4.0–5.6)

## 2022-07-18 MED ORDER — CHLORTHALIDONE 50 MG PO TABS: 50.0000 mg | ORAL_TABLET | Freq: Every day | ORAL | 3 refills | Status: DC

## 2022-07-18 MED ORDER — AMLODIPINE BESYLATE 10 MG PO TABS: 10.0000 mg | ORAL_TABLET | Freq: Every day | ORAL | 3 refills | Status: DC

## 2022-07-18 NOTE — Assessment & Plan Note (Signed)
Chronic, stable Wishes to continue daily benzo 0.25 mg xanax to assist PDMP reviewed

## 2022-07-18 NOTE — Assessment & Plan Note (Signed)
Body mass index is 38.62 kg/m. Discussed importance of healthy weight management Discussed diet and exercise

## 2022-07-18 NOTE — Assessment & Plan Note (Signed)
Chronic, on supplement Continue for an additional 3 months Plan to check labs later this summer

## 2022-07-18 NOTE — Assessment & Plan Note (Signed)
Chronic, stable Body mass index is 38.62 kg/m. Continue to recommend balanced, lower carb meals. Smaller meal size, adding snacks. Choosing water as drink of choice and increasing purposeful exercise.

## 2022-07-18 NOTE — Assessment & Plan Note (Signed)
Chronic, stable Continue Norvasc 10 Repeat CBC and CMP in 3 months for 6 month f/u

## 2022-07-18 NOTE — Assessment & Plan Note (Signed)
Chronic, remains untreated given no delivery of CPAP machine; will resubmit Encourage weight loss to assist

## 2022-07-18 NOTE — Assessment & Plan Note (Signed)
Chronic, previously elevated Off medications recommend diet low in saturated fat and regular exercise - 30 min at least 5 times per week

## 2022-07-18 NOTE — Progress Notes (Signed)
Established patient visit  Patient: Nathan Nelson   DOB: 1985/10/09   37 y.o. Male  MRN: [id number] Visit Date: 07/18/2022  Today's healthcare provider: Gwyneth Sprout, FNP  Re Introduced to nurse practitioner role and practice setting.  All questions answered.  Discussed provider/patient relationship and expectations.  Subjective    HPI HPI   Follow-up-chronic disease management. Last edited by Elta Guadeloupe, CMA on 07/18/2022  1:37 PM.      Medications: Outpatient Medications Prior to Visit  Medication Sig   ALPRAZolam (XANAX) 0.25 MG tablet Take 1 tablet (0.25 mg total) by mouth daily as needed for anxiety.   buPROPion (WELLBUTRIN XL) 300 MG 24 hr tablet Take 1 tablet (300 mg total) by mouth daily.   loperamide (IMODIUM A-D) 2 MG tablet Take 1 tablet (2 mg total) by mouth 4 (four) times daily as needed for diarrhea or loose stools.   ondansetron (ZOFRAN-ODT) 4 MG disintegrating tablet Take 1 tablet (4 mg total) by mouth every 8 (eight) hours as needed for nausea or vomiting.   Vitamin D, Ergocalciferol, (DRISDOL) 1.25 MG (50000 UNIT) CAPS capsule Take 1 capsule (50,000 Units total) by mouth every 7 (seven) days. Take with fat containing meal.   [DISCONTINUED] amLODipine (NORVASC) 10 MG tablet Take 1 tablet by mouth once daily   [DISCONTINUED] chlorthalidone (HYGROTON) 50 MG tablet Take 1 tablet by mouth once daily   No facility-administered medications prior to visit.    Review of Systems    Objective    BP 124/88 (BP Location: Left Arm, Patient Position: Sitting, Cuff Size: Large)   Pulse 99   Temp 98.3 F (36.8 C)   Ht '5\' 4"'$  (1.626 m)   Wt 225 lb (102.1 kg)   SpO2 98%   BMI 38.62 kg/m   Physical Exam Vitals and nursing note reviewed.  Constitutional:      Appearance: Normal appearance. He is obese.  HENT:     Head: Normocephalic and atraumatic.  Eyes:     Pupils: Pupils are equal, round, and reactive to light.  Cardiovascular:     Rate and Rhythm: Normal rate  and regular rhythm.     Pulses: Normal pulses.     Heart sounds: Normal heart sounds.  Pulmonary:     Effort: Pulmonary effort is normal.     Breath sounds: Normal breath sounds.  Musculoskeletal:        General: Normal range of motion.     Cervical back: Normal range of motion.  Skin:    General: Skin is warm and dry.     Capillary Refill: Capillary refill takes less than 2 seconds.  Neurological:     General: No focal deficit present.     Mental Status: He is alert and oriented to person, place, and time. Mental status is at baseline.  Psychiatric:        Mood and Affect: Mood normal.        Behavior: Behavior normal.        Thought Content: Thought content normal.        Judgment: Judgment normal.     Results for orders placed or performed in visit on 07/18/22  POCT HgB A1C  Result Value Ref Range   Hemoglobin A1C 5.7 (A) 4.0 - 5.6 %   HbA1c POC (<> result, manual entry)     HbA1c, POC (prediabetic range)     HbA1c, POC (controlled diabetic range)      Assessment & Plan  Problem List Items Addressed This Visit       Cardiovascular and Mediastinum   Essential (primary) hypertension    Chronic, stable Continue Norvasc 10 Repeat CBC and CMP in 3 months for 6 month f/u      Relevant Medications   amLODipine (NORVASC) 10 MG tablet   chlorthalidone (HYGROTON) 50 MG tablet   Other Relevant Orders   CBC with Differential/Platelet   Comprehensive Metabolic Panel (CMET)     Respiratory   OSA (obstructive sleep apnea)    Chronic, remains untreated given no delivery of CPAP machine; will resubmit Encourage weight loss to assist        Other   Avitaminosis D    Chronic, on supplement Continue for an additional 3 months Plan to check labs later this summer      Relevant Orders   Vitamin D (25 hydroxy)   Body mass index (BMI) of 38.0-38.9 in adult    Body mass index is 38.62 kg/m. Discussed importance of healthy weight management Discussed diet and  exercise       Chronic prescription benzodiazepine use    Pt is aware of risks of psychoactive medication use to include increased sedation, respiratory suppression, falls, extrapyramidal movements,  dependence and cardiovascular events.  Pt would like to continue treatment as benefit determined to outweigh risk.         GAD (generalized anxiety disorder)    Chronic, stable Wishes to continue daily benzo 0.25 mg xanax to assist PDMP reviewed      Hyperlipidemia    Chronic, previously elevated Off medications recommend diet low in saturated fat and regular exercise - 30 min at least 5 times per week       Relevant Medications   amLODipine (NORVASC) 10 MG tablet   chlorthalidone (HYGROTON) 50 MG tablet   Other Relevant Orders   Lipid panel   Morbid obesity (HCC)    Chronic, stable Body mass index is 38.62 kg/m. Continue to recommend balanced, lower carb meals. Smaller meal size, adding snacks. Choosing water as drink of choice and increasing purposeful exercise.       Relevant Orders   Comprehensive Metabolic Panel (CMET)   Prediabetes - Primary    Chronic, stable Continue to work on diet and exercise to assist given co-morbid HTN and HLD- currently untreated given low ASCVD risk      Relevant Orders   Hemoglobin A1c   POCT HgB A1C (Completed)   Return in about 3 months (around 10/18/2022) for labs only- 8-11:30; 1-4:30.     I, Gwyneth Sprout, FNP, have reviewed all documentation for this visit. The documentation on 07/18/22 for the exam, diagnosis, procedures, and orders are all accurate and complete.  Gwyneth Sprout, [place] [phone] (phone) [phone] (fax)  [place]

## 2022-07-18 NOTE — Assessment & Plan Note (Signed)
Pt is aware of risks of psychoactive medication use to include increased sedation, respiratory suppression, falls, extrapyramidal movements,  dependence and cardiovascular events.  Pt would like to continue treatment as benefit determined to outweigh risk.

## 2022-07-18 NOTE — Assessment & Plan Note (Signed)
Chronic, stable Continue to work on diet and exercise to assist given co-morbid HTN and HLD- currently untreated given low ASCVD risk

## 2022-08-05 ENCOUNTER — Encounter: Payer: Self-pay | Admitting: Family Medicine

## 2022-08-07 ENCOUNTER — Telehealth: Payer: Self-pay

## 2022-08-07 ENCOUNTER — Ambulatory Visit: Payer: Self-pay | Admitting: *Deleted

## 2022-08-07 NOTE — Telephone Encounter (Signed)
Patient scheduled appointment with Nathan Nelson for 08/07/22 wit complaint of rib and chest pain.  This needs to be triaged first.    [facility] [place] Triage Nurse may triage call to see if appropriate for office visit or if ER is needed

## 2022-08-07 NOTE — Progress Notes (Unsigned)
I,J'ya E Lizanne Erker,acting as a scribe for Gwyneth Sprout, FNP.,have documented all relevant documentation on the behalf of Gwyneth Sprout, FNP,as directed by  Gwyneth Sprout, FNP while in the presence of Gwyneth Sprout, FNP.  Established patient visit  Patient: Nathan Nelson   DOB: 27-Jun-1985   37 y.o. Male  MRN: [id number] Visit Date: 08/08/2022  Today's healthcare provider: Gwyneth Sprout, FNP  Re Introduced to nurse practitioner role and practice setting.  All questions answered.  Discussed provider/patient relationship and expectations.  Chief Complaint  Patient presents with   Chest Pain   Subjective    HPI  Chest/Ribs Pain: Patient complains of chest pain. Onset was 2 years ago, with worsening course since that time. The patient describes the pain as intermittent, dull, pressure like, sharp, and substernal in nature, radiates to the upper back. Patient rates pain as a 10/10 in intensity. Pt reports that today the pain is not as bad, is worsened with certain movements.  Associated symptoms are chest pain. Aggravating factors are  life, work, social stressors, children, family stressors .  Alleviating factors are: exercise and Tylenol (topical and oral) . Patient's cardiac risk factors are hypertension, male gender, and smoking/ tobacco exposure.  Patient's risk factors for DVT/PE: none. Previous cardiac testing: chest x-ray. He reports that he has not been in an accident.   Medications: Outpatient Medications Prior to Visit  Medication Sig   ALPRAZolam (XANAX) 0.25 MG tablet Take 1 tablet (0.25 mg total) by mouth daily as needed for anxiety.   amLODipine (NORVASC) 10 MG tablet Take 1 tablet (10 mg total) by mouth daily.   buPROPion (WELLBUTRIN XL) 300 MG 24 hr tablet Take 1 tablet (300 mg total) by mouth daily.   chlorthalidone (HYGROTON) 50 MG tablet Take 1 tablet (50 mg total) by mouth daily.   [DISCONTINUED] Vitamin D, Ergocalciferol, (DRISDOL) 1.25 MG (50000 UNIT) CAPS capsule Take 1  capsule (50,000 Units total) by mouth every 7 (seven) days. Take with fat containing meal.   [DISCONTINUED] loperamide (IMODIUM A-D) 2 MG tablet Take 1 tablet (2 mg total) by mouth 4 (four) times daily as needed for diarrhea or loose stools. (Patient not taking: Reported on 08/08/2022)   [DISCONTINUED] ondansetron (ZOFRAN-ODT) 4 MG disintegrating tablet Take 1 tablet (4 mg total) by mouth every 8 (eight) hours as needed for nausea or vomiting. (Patient not taking: Reported on 08/08/2022)   No facility-administered medications prior to visit.    Review of Systems     Objective    BP (!) 135/98 (BP Location: Right Arm, Patient Position: Sitting, Cuff Size: Large)   Pulse 89   Temp 97.9 F (36.6 C) (Oral)   Resp 14   Ht 5' 4.17" (1.63 m)   Wt 225 lb 12.8 oz (102.4 kg)   SpO2 100%   BMI 38.55 kg/m    Physical Exam Vitals and nursing note reviewed.  Constitutional:      Appearance: Normal appearance. He is obese.  HENT:     Head: Normocephalic and atraumatic.  Eyes:     Pupils: Pupils are equal, round, and reactive to light.  Cardiovascular:     Rate and Rhythm: Normal rate and regular rhythm.     Pulses: Normal pulses.     Heart sounds: Normal heart sounds.  Pulmonary:     Effort: Pulmonary effort is normal.     Breath sounds: Normal breath sounds.  Abdominal:     General: Bowel sounds are  normal. There is no distension.     Palpations: Abdomen is soft. There is no mass.     Tenderness: There is no abdominal tenderness. There is no guarding.  Musculoskeletal:        General: Normal range of motion.     Cervical back: Normal range of motion.  Skin:    General: Skin is warm and dry.     Capillary Refill: Capillary refill takes less than 2 seconds.  Neurological:     General: No focal deficit present.     Mental Status: He is alert and oriented to person, place, and time. Mental status is at baseline.  Psychiatric:        Mood and Affect: Mood normal.        Behavior:  Behavior normal.        Thought Content: Thought content normal.        Judgment: Judgment normal.     No results found for any visits on 08/08/22.  Assessment & Plan     Problem List Items Addressed This Visit       Cardiovascular and Mediastinum   Primary hypertension    Chronic, slightly elevated today Goal <140/<90 Defer changes to current medication; however, recommend strict adherence to diet/exercise as "lifestyle management" Previously elevated on norvasc 10 mg as well as chlorthalidone 50 mg         Other   Avitaminosis D    Chronic, previously low on supplemental Rx Will refill today      Relevant Medications   Vitamin D, Ergocalciferol, (DRISDOL) 1.25 MG (50000 UNIT) CAPS capsule   Chest pain in adult - Primary    Acute on chronic, limited suspicion for MI or cardiac involvement EKG completed and WDL; unable to compare to baseline Normal rate without ectopy      Relevant Orders   EKG 12-Lead   GAD (generalized anxiety disorder)    Acute on chronic, continues on low dose xanax as abortive therapy as well as wellbutrin 300 mg Discussed titration to wellbutrin 450 mg or addition of ssri as previously discussed Focused discussion on stress management including blocked breathing and use of popping the anxiety bubble and glimmers in everyday life       Morbid obesity ([place])    Chronic, stable Body mass index is 38.55 kg/m. Discussed importance of healthy weight management Discussed diet and exercise       Return if symptoms worsen or fail to improve.     Vonna Kotyk, FNP, have reviewed all documentation for this visit. The documentation on 08/08/22 for the exam, diagnosis, procedures, and orders are all accurate and complete.  Gwyneth Sprout, [place] [phone] (phone) [phone] (fax)  [place]

## 2022-08-07 NOTE — Telephone Encounter (Signed)
Reason for Disposition  [1] Chest pain(s) lasting a few seconds AND [2] persists > 3 days  Answer Assessment - Initial Assessment Questions 1. LOCATION: "Where does it hurt?"       Sat. I'm having rib pain.    In the middle of my chest is sore.   Left side of my breast bone hurts and over my ribs.   Is tender.    This has been going on for a long time.    It comes and goes.   No shortness of breath.    No recent illness.   The ibuprofen helps the pain. 2. RADIATION: "Does the pain go anywhere else?" (e.g., into neck, jaw, arms, back)     I feel it in the back of my ribs. 3. ONSET: "When did the chest pain begin?" (Minutes, hours or days)      It's been coming and going for a long time. 4. PATTERN: "Does the pain come and go, or has it been constant since it started?"  "Does it get worse with exertion?"      Intermittent. 5. DURATION: "How long does it last" (e.g., seconds, minutes, hours)     It comes and goes.   When no ibuprofen it aches.   Left side of my chest is sore when I press on it.    6. SEVERITY: "How bad is the pain?"  (e.g., Scale 1-10; mild, moderate, or severe)    - MILD (1-3): doesn't interfere with normal activities     - MODERATE (4-7): interferes with normal activities or awakens from sleep    - SEVERE (8-10): excruciating pain, unable to do any normal activities       Moderate 7. CARDIAC RISK FACTORS: "Do you have any history of heart problems or risk factors for heart disease?" (e.g., angina, prior heart attack; diabetes, high blood pressure, high cholesterol, smoker, or strong family history of heart disease)     No 8. PULMONARY RISK FACTORS: "Do you have any history of lung disease?"  (e.g., blood clots in lung, asthma, emphysema, birth control pills)     No 9. CAUSE: "What do you think is causing the chest pain?"     Not asked 10. OTHER SYMPTOMS: "Do you have any other symptoms?" (e.g., dizziness, nausea, vomiting, sweating, fever, difficulty breathing, cough)        No 11. PREGNANCY: "Is there any chance you are pregnant?" "When was your last menstrual period?"       N/A  Protocols used: Chest Pain-A-AH

## 2022-08-07 NOTE — Telephone Encounter (Signed)
  Chief Complaint: [facility] requested he be triaged due to c/o chest and rib pain.   Has an appt for tomorrow 3/26 with Tally Joe, FNP at 3:20. Symptoms: Soreness in his chest and ribs.   When he stretches and or presses on his ribs they hurt. Frequency: This has been going on a long time.   It's intermittent and ibuprofen helps the pain. Pertinent Negatives: Patient denies shortness of breath, fever, cardiac history, breaking out in a sweat. Disposition: [] ED /[] Urgent Care (no appt availability in office) / [x] Appointment(In office/virtual)/ []  [facility]/ [] Home Care/ [] Refused Recommended Disposition /[] [facility]/ []  Follow-up with PCP Additional Notes: Instructed to go to the ED if he started having chest pain that did not go away or was affected by him pressing on his chest causing it to hurt.   Shortness of breath, breaking out in a sweat, chest tightness.    He was agreeable to this plan.    He mentioned he was at work when he called.

## 2022-08-08 ENCOUNTER — Ambulatory Visit (INDEPENDENT_AMBULATORY_CARE_PROVIDER_SITE_OTHER): Payer: BC Managed Care – PPO | Admitting: Family Medicine

## 2022-08-08 ENCOUNTER — Encounter: Payer: Self-pay | Admitting: Family Medicine

## 2022-08-08 VITALS — BP 135/98 | HR 89 | Temp 97.9°F | Resp 14 | Ht 64.17 in | Wt 225.8 lb

## 2022-08-08 DIAGNOSIS — F411 Generalized anxiety disorder: Secondary | ICD-10-CM | POA: Diagnosis not present

## 2022-08-08 DIAGNOSIS — E559 Vitamin D deficiency, unspecified: Secondary | ICD-10-CM

## 2022-08-08 DIAGNOSIS — R079 Chest pain, unspecified: Secondary | ICD-10-CM

## 2022-08-08 DIAGNOSIS — I10 Essential (primary) hypertension: Secondary | ICD-10-CM

## 2022-08-08 MED ORDER — VITAMIN D (ERGOCALCIFEROL) 1.25 MG (50000 UNIT) PO CAPS: 50000.0000 [IU] | ORAL_CAPSULE | ORAL | 0 refills | Status: DC

## 2022-08-08 NOTE — Assessment & Plan Note (Signed)
Acute on chronic, limited suspicion for MI or cardiac involvement EKG completed and WDL; unable to compare to baseline Normal rate without ectopy

## 2022-08-08 NOTE — Assessment & Plan Note (Signed)
Acute on chronic, continues on low dose xanax as abortive therapy as well as wellbutrin 300 mg Discussed titration to wellbutrin 450 mg or addition of ssri as previously discussed Focused discussion on stress management including blocked breathing and use of popping the anxiety bubble and glimmers in everyday life

## 2022-08-08 NOTE — Assessment & Plan Note (Signed)
Chronic, slightly elevated today Goal <140/<90 Defer changes to current medication; however, recommend strict adherence to diet/exercise as "lifestyle management" Previously elevated on norvasc 10 mg as well as chlorthalidone 50 mg

## 2022-08-08 NOTE — Assessment & Plan Note (Signed)
Chronic, previously low on supplemental Rx Will refill today

## 2022-08-08 NOTE — Assessment & Plan Note (Signed)
Chronic, stable Body mass index is 38.55 kg/m. Discussed importance of healthy weight management Discussed diet and exercise

## 2022-08-14 DIAGNOSIS — Z419 Encounter for procedure for purposes other than remedying health state, unspecified: Secondary | ICD-10-CM | POA: Diagnosis not present

## 2022-09-13 DIAGNOSIS — Z419 Encounter for procedure for purposes other than remedying health state, unspecified: Secondary | ICD-10-CM | POA: Diagnosis not present

## 2022-09-20 ENCOUNTER — Emergency Department: Payer: BC Managed Care – PPO

## 2022-09-20 ENCOUNTER — Encounter: Payer: Self-pay | Admitting: Family Medicine

## 2022-09-20 ENCOUNTER — Emergency Department
Admission: EM | Admit: 2022-09-20 | Discharge: 2022-09-20 | Disposition: A | Discharge status: Home / Self Care | Payer: BC Managed Care – PPO | Attending: Emergency Medicine | Admitting: Emergency Medicine

## 2022-09-20 ENCOUNTER — Ambulatory Visit (INDEPENDENT_AMBULATORY_CARE_PROVIDER_SITE_OTHER): Payer: BC Managed Care – PPO | Admitting: Family Medicine

## 2022-09-20 ENCOUNTER — Encounter: Payer: Self-pay | Admitting: Emergency Medicine

## 2022-09-20 DIAGNOSIS — Z9889 Other specified postprocedural states: Secondary | ICD-10-CM | POA: Insufficient documentation

## 2022-09-20 DIAGNOSIS — M25562 Pain in left knee: Secondary | ICD-10-CM | POA: Diagnosis not present

## 2022-09-20 DIAGNOSIS — Z91199 Patient's noncompliance with other medical treatment and regimen due to unspecified reason: Secondary | ICD-10-CM

## 2022-09-20 MED ORDER — MELOXICAM 15 MG PO TABS: 15.0000 mg | ORAL_TABLET | Freq: Every day | ORAL | 2 refills | Status: DC
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
## 2022-09-20 NOTE — Progress Notes (Signed)
Same day appt scheduled; however, patient was not present for appt.  Jacky Kindle, FNP  [facility] [address] [place], [place] [address] [phone] (phone) [phone] (fax) [facility]
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
## 2022-09-25 ENCOUNTER — Encounter: Payer: Self-pay | Admitting: Family Medicine

## 2022-10-04 DIAGNOSIS — M67362 Transient synovitis, left knee: Secondary | ICD-10-CM | POA: Diagnosis not present

## 2022-10-04 DIAGNOSIS — M25562 Pain in left knee: Secondary | ICD-10-CM | POA: Diagnosis not present

## 2022-10-14 DIAGNOSIS — Z419 Encounter for procedure for purposes other than remedying health state, unspecified: Secondary | ICD-10-CM | POA: Diagnosis not present

## 2022-10-18 DIAGNOSIS — M25562 Pain in left knee: Secondary | ICD-10-CM | POA: Diagnosis not present

## 2022-10-26 ENCOUNTER — Other Ambulatory Visit: Payer: Self-pay | Admitting: Orthopedic Surgery

## 2022-10-26 DIAGNOSIS — M25562 Pain in left knee: Secondary | ICD-10-CM

## 2022-11-01 ENCOUNTER — Other Ambulatory Visit: Payer: Self-pay | Admitting: Family Medicine

## 2022-11-02 MED ORDER — ALPRAZOLAM 0.25 MG PO TABS: 0.2500 mg | ORAL_TABLET | Freq: Every day | ORAL | 0 refills | Status: DC | PRN

## 2022-11-09 ENCOUNTER — Ambulatory Visit
Admission: RE | Admit: 2022-11-09 | Discharge: 2022-11-09 | Disposition: A | Discharge status: Home / Self Care | Payer: BC Managed Care – PPO | Source: Ambulatory Visit | Attending: Orthopedic Surgery | Admitting: Orthopedic Surgery

## 2022-11-09 DIAGNOSIS — S7292XD Unspecified fracture of left femur, subsequent encounter for closed fracture with routine healing: Secondary | ICD-10-CM | POA: Diagnosis not present

## 2022-11-09 DIAGNOSIS — M25562 Pain in left knee: Secondary | ICD-10-CM

## 2022-11-13 DIAGNOSIS — Z419 Encounter for procedure for purposes other than remedying health state, unspecified: Secondary | ICD-10-CM | POA: Diagnosis not present

## 2022-11-22 ENCOUNTER — Other Ambulatory Visit: Payer: BC Managed Care – PPO

## 2022-12-14 DIAGNOSIS — Z419 Encounter for procedure for purposes other than remedying health state, unspecified: Secondary | ICD-10-CM | POA: Diagnosis not present

## 2023-01-04 ENCOUNTER — Ambulatory Visit (HOSPITAL_COMMUNITY): Admit: 2023-01-04 | Payer: BC Managed Care – PPO | Admitting: Orthopedic Surgery

## 2023-01-04 SURGERY — REMOVAL, HARDWARE
Anesthesia: General | Laterality: Left

## 2023-01-14 DIAGNOSIS — Z419 Encounter for procedure for purposes other than remedying health state, unspecified: Secondary | ICD-10-CM | POA: Diagnosis not present

## 2023-02-13 DIAGNOSIS — Z419 Encounter for procedure for purposes other than remedying health state, unspecified: Secondary | ICD-10-CM | POA: Diagnosis not present

## 2023-02-26 ENCOUNTER — Encounter (HOSPITAL_COMMUNITY): Payer: Self-pay | Admitting: Orthopedic Surgery

## 2023-02-26 ENCOUNTER — Other Ambulatory Visit: Payer: Self-pay

## 2023-02-26 NOTE — Anesthesia Preprocedure Evaluation (Signed)
Anesthesia Evaluation  Patient identified by MRN, date of birth, ID band Patient awake    Reviewed: Allergy & Precautions, NPO status , Patient's Chart, lab work & pertinent test results  Airway Mallampati: III  TM Distance: >3 FB Neck ROM: Full    Dental no notable dental hx. (+) Dental Advisory Given, Teeth Intact   Pulmonary sleep apnea , former smoker   Pulmonary exam normal breath sounds clear to auscultation       Cardiovascular hypertension, Pt. on medications Normal cardiovascular exam Rhythm:Regular Rate:Normal     Neuro/Psych  PSYCHIATRIC DISORDERS Anxiety     negative neurological ROS     GI/Hepatic Neg liver ROS,GERD  ,,  Endo/Other  negative endocrine ROS    Renal/GU negative Renal ROS     Musculoskeletal  (+) Arthritis ,    Abdominal  (+) + obese  Peds  Hematology negative hematology ROS (+)   Anesthesia Other Findings   Reproductive/Obstetrics                             Anesthesia Physical Anesthesia Plan  ASA: 2  Anesthesia Plan: General   Post-op Pain Management: Tylenol PO (pre-op)* and Celebrex PO (pre-op)*   Induction: Intravenous  PONV Risk Score and Plan: 3 and Ondansetron, Dexamethasone, Treatment may vary due to age or medical condition and Midazolam  Airway Management Planned: LMA  Additional Equipment:   Intra-op Plan:   Post-operative Plan: Extubation in OR  Informed Consent: I have reviewed the patients History and Physical, chart, labs and discussed the procedure including the risks, benefits and alternatives for the proposed anesthesia with the patient or authorized representative who has indicated his/her understanding and acceptance.     Dental advisory given  Plan Discussed with: CRNA  Anesthesia Plan Comments: (I had a long discussion about his K of 2.7. He has been on K supplements in past, but PCP hasn't restarted. I encouraged him to  f/u with PCP ASAP for K supplementation. I expressed concern for increased risk of complication in the OR. I explained that I do not think it's a prohibitive risk, but that I cannot quantify the extent of the risk. The patient wishes to proceed.)        Anesthesia Quick Evaluation

## 2023-02-26 NOTE — Progress Notes (Addendum)
PCP - Merita Norton, NP Cardiologist - none  Chest x-ray - n/a EKG - 08/08/22 Stress Test - n/a ECHO - n/a Cardiac Cath - n/a  ICD Pacemaker/Loop - n/a  Sleep Study -  Yes- mild CPAP - does not use CPAP  Diabetes -n/a  NPO  STOP now taking any Aspirin (unless otherwise instructed by your surgeon), Aleve, Naproxen, Ibuprofen, Motrin, Advil, Goody's, BC's, all herbal medications, fish oil, and all vitamins.   Coronavirus Screening Do you have any of the following symptoms:  Cough yes/no: No Fever (>100.41F)  yes/no: No Runny nose yes/no: No Sore throat yes/no: No Difficulty breathing/shortness of breath  yes/no: No  Have you traveled in the last 14 days and where? yes/no: No  Patient verbalized understanding of instructions that were given via phone.
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
## 2023-02-27 ENCOUNTER — Other Ambulatory Visit: Payer: Self-pay

## 2023-02-27 ENCOUNTER — Encounter (HOSPITAL_COMMUNITY): Payer: Self-pay | Admitting: Orthopedic Surgery

## 2023-02-27 ENCOUNTER — Encounter (HOSPITAL_COMMUNITY)
Admission: RE | Disposition: A | Discharge status: Home / Self Care | Payer: Self-pay | Source: Home / Self Care | Attending: Orthopedic Surgery

## 2023-02-27 ENCOUNTER — Ambulatory Visit (HOSPITAL_COMMUNITY): Payer: BC Managed Care – PPO

## 2023-02-27 ENCOUNTER — Other Ambulatory Visit (HOSPITAL_COMMUNITY): Payer: Self-pay

## 2023-02-27 ENCOUNTER — Ambulatory Visit (HOSPITAL_COMMUNITY)
Admission: RE | Admit: 2023-02-27 | Discharge: 2023-02-27 | Disposition: A | Discharge status: Home / Self Care | Payer: BC Managed Care – PPO | Attending: Orthopedic Surgery | Admitting: Orthopedic Surgery

## 2023-02-27 ENCOUNTER — Ambulatory Visit (HOSPITAL_COMMUNITY): Payer: Self-pay | Admitting: Physician Assistant

## 2023-02-27 ENCOUNTER — Ambulatory Visit (HOSPITAL_COMMUNITY): Payer: BC Managed Care – PPO | Admitting: Physician Assistant

## 2023-02-27 DIAGNOSIS — I10 Essential (primary) hypertension: Secondary | ICD-10-CM | POA: Diagnosis not present

## 2023-02-27 DIAGNOSIS — G4730 Sleep apnea, unspecified: Secondary | ICD-10-CM | POA: Insufficient documentation

## 2023-02-27 DIAGNOSIS — Z87891 Personal history of nicotine dependence: Secondary | ICD-10-CM | POA: Diagnosis not present

## 2023-02-27 DIAGNOSIS — K219 Gastro-esophageal reflux disease without esophagitis: Secondary | ICD-10-CM | POA: Diagnosis not present

## 2023-02-27 DIAGNOSIS — Z459 Encounter for adjustment and management of unspecified implanted device: Secondary | ICD-10-CM | POA: Diagnosis not present

## 2023-02-27 DIAGNOSIS — T8484XA Pain due to internal orthopedic prosthetic devices, implants and grafts, initial encounter: Secondary | ICD-10-CM

## 2023-02-27 DIAGNOSIS — Z4589 Encounter for adjustment and management of other implanted devices: Secondary | ICD-10-CM | POA: Insufficient documentation

## 2023-02-27 DIAGNOSIS — Z472 Encounter for removal of internal fixation device: Secondary | ICD-10-CM | POA: Diagnosis not present

## 2023-02-27 HISTORY — DX: Unspecified osteoarthritis, unspecified site: M19.90

## 2023-02-27 HISTORY — DX: Sleep apnea, unspecified: G47.30

## 2023-02-27 HISTORY — PX: HARDWARE REMOVAL: SHX979

## 2023-02-27 LAB — COMPREHENSIVE METABOLIC PANEL
ALT: 24 U/L (ref 0–44)
AST: 24 U/L (ref 15–41)
Albumin: 3.9 g/dL (ref 3.5–5.0)
Alkaline Phosphatase: 85 U/L (ref 38–126)
Anion gap: 13 (ref 5–15)
BUN: 14 mg/dL (ref 6–20)
CO2: 26 mmol/L (ref 22–32)
Calcium: 9.6 mg/dL (ref 8.9–10.3)
Chloride: 96 mmol/L — ABNORMAL LOW (ref 98–111)
Creatinine, Ser: 1.15 mg/dL (ref 0.61–1.24)
GFR, Estimated: >60 mL/min (ref >60)
Glucose, Bld: 102 mg/dL — ABNORMAL HIGH (ref 70–99)
Potassium: 2.7 mmol/L — CL (ref 3.5–5.1)
Sodium: 135 mmol/L (ref 135–145)
Total Bilirubin: 0.6 mg/dL (ref 0.3–1.2)
Total Protein: 8.2 g/dL — ABNORMAL HIGH (ref 6.5–8.1)

## 2023-02-27 LAB — CBC WITH DIFFERENTIAL/PLATELET
Abs Immature Granulocytes: 0.03 10*3/uL (ref 0.00–0.07)
Basophils Absolute: 0.1 10*3/uL (ref 0.0–0.1)
Basophils Relative: 1 %
Eosinophils Absolute: 0.3 10*3/uL (ref 0.0–0.5)
Eosinophils Relative: 2 %
HCT: 45.9 % (ref 39.0–52.0)
Hemoglobin: 15.7 g/dL (ref 13.0–17.0)
Immature Granulocytes: 0 %
Lymphocytes Relative: 27 %
Lymphs Abs: 3.2 10*3/uL (ref 0.7–4.0)
MCH: 28.2 pg (ref 26.0–34.0)
MCHC: 34.2 g/dL (ref 30.0–36.0)
MCV: 82.6 fL (ref 80.0–100.0)
Monocytes Absolute: 1 10*3/uL (ref 0.1–1.0)
Monocytes Relative: 9 %
Neutro Abs: 7.1 10*3/uL (ref 1.7–7.7)
Neutrophils Relative %: 61 %
Platelets: 469 10*3/uL — ABNORMAL HIGH (ref 150–400)
RBC: 5.56 MIL/uL (ref 4.22–5.81)
RDW: 12.5 % (ref 11.5–15.5)
WBC: 11.6 10*3/uL — ABNORMAL HIGH (ref 4.0–10.5)
nRBC: 0 % (ref 0.0–0.2)

## 2023-02-27 SURGERY — REMOVAL, HARDWARE
Anesthesia: General | Site: Leg Upper | Laterality: Left

## 2023-02-27 MED ORDER — PROPOFOL 10 MG/ML IV BOLUS
INTRAVENOUS | Status: AC
  Filled 2023-02-27: qty 20

## 2023-02-27 MED ORDER — DROPERIDOL 2.5 MG/ML IJ SOLN: 0.6250 mg | Freq: Once | INTRAMUSCULAR | Status: DC | PRN

## 2023-02-27 MED ORDER — METHOCARBAMOL 500 MG PO TABS
500.0000 mg | ORAL_TABLET | Freq: Four times a day (QID) | ORAL | 0 refills | Status: DC | PRN
Start: 2023-02-27 — End: 2023-07-03
  Filled 2023-02-27: qty 60, 8d supply, fill #0

## 2023-02-27 MED ORDER — ACETAMINOPHEN 500 MG PO TABS
1000.0000 mg | ORAL_TABLET | Freq: Once | ORAL | Status: AC
  Administered 2023-02-27: 1000 mg via ORAL
  Filled 2023-02-27: qty 2

## 2023-02-27 MED ORDER — POTASSIUM CHLORIDE 10 MEQ/100ML IV SOLN
INTRAVENOUS | Status: DC | PRN
Start: 2023-02-27 — End: 2023-02-27
  Administered 2023-02-27: 10 meq via INTRAVENOUS

## 2023-02-27 MED ORDER — KETOROLAC TROMETHAMINE 10 MG PO TABS
10.0000 mg | ORAL_TABLET | Freq: Four times a day (QID) | ORAL | 0 refills | Status: DC | PRN
Start: 2023-02-27 — End: 2023-07-03
  Filled 2023-02-27: qty 20, 5d supply, fill #0

## 2023-02-27 MED ORDER — ONDANSETRON 4 MG PO TBDP
4.0000 mg | ORAL_TABLET | Freq: Three times a day (TID) | ORAL | 0 refills | Status: DC | PRN
Start: 2023-02-27 — End: 2024-01-02
  Filled 2023-02-27: qty 20, 7d supply, fill #0

## 2023-02-27 MED ORDER — LACTATED RINGERS IV SOLN: INTRAVENOUS | Status: DC

## 2023-02-27 MED ORDER — FENTANYL CITRATE (PF) 250 MCG/5ML IJ SOLN
INTRAMUSCULAR | Status: AC
  Filled 2023-02-27: qty 5

## 2023-02-27 MED ORDER — CHLORHEXIDINE GLUCONATE 0.12 % MT SOLN
15.0000 mL | Freq: Once | OROMUCOSAL | Status: AC
  Administered 2023-02-27: 15 mL via OROMUCOSAL
  Filled 2023-02-27: qty 15

## 2023-02-27 MED ORDER — ORAL CARE MOUTH RINSE: 15.0000 mL | Freq: Once | OROMUCOSAL | Status: AC

## 2023-02-27 MED ORDER — POTASSIUM CHLORIDE 10 MEQ/100ML IV SOLN: 10.0000 meq | Freq: Once | INTRAVENOUS | Status: DC

## 2023-02-27 MED ORDER — ONDANSETRON HCL 4 MG/2ML IJ SOLN
INTRAMUSCULAR | Status: DC | PRN
  Administered 2023-02-27: 4 mg via INTRAVENOUS

## 2023-02-27 MED ORDER — MEPERIDINE HCL 25 MG/ML IJ SOLN: 6.2500 mg | INTRAMUSCULAR | Status: DC | PRN

## 2023-02-27 MED ORDER — MIDAZOLAM HCL 2 MG/2ML IJ SOLN
INTRAMUSCULAR | Status: AC
  Filled 2023-02-27: qty 2

## 2023-02-27 MED ORDER — HYDROMORPHONE HCL 1 MG/ML IJ SOLN
INTRAMUSCULAR | Status: AC
  Filled 2023-02-27: qty 1

## 2023-02-27 MED ORDER — DEXAMETHASONE SODIUM PHOSPHATE 10 MG/ML IJ SOLN
INTRAMUSCULAR | Status: DC | PRN
  Administered 2023-02-27: 10 mg via INTRAVENOUS

## 2023-02-27 MED ORDER — HYDROMORPHONE HCL 1 MG/ML IJ SOLN
0.2500 mg | INTRAMUSCULAR | Status: DC | PRN
  Administered 2023-02-27 (×3): 0.5 mg via INTRAVENOUS

## 2023-02-27 MED ORDER — OXYCODONE-ACETAMINOPHEN 5-325 MG PO TABS
1.0000 | ORAL_TABLET | Freq: Three times a day (TID) | ORAL | 0 refills | Status: DC | PRN
  Filled 2023-02-27: qty 40, 7d supply, fill #0

## 2023-02-27 MED ORDER — LIDOCAINE 2% (20 MG/ML) 5 ML SYRINGE
INTRAMUSCULAR | Status: DC | PRN
  Administered 2023-02-27: 100 mg via INTRAVENOUS

## 2023-02-27 MED ORDER — OXYCODONE HCL 5 MG/5ML PO SOLN: 5.0000 mg | Freq: Once | ORAL | Status: DC | PRN

## 2023-02-27 MED ORDER — MIDAZOLAM HCL 2 MG/2ML IJ SOLN
INTRAMUSCULAR | Status: DC | PRN
  Administered 2023-02-27: 2 mg via INTRAVENOUS

## 2023-02-27 MED ORDER — OXYCODONE HCL 5 MG PO TABS: 5.0000 mg | ORAL_TABLET | Freq: Once | ORAL | Status: DC | PRN

## 2023-02-27 MED ORDER — 0.9 % SODIUM CHLORIDE (POUR BTL) OPTIME
TOPICAL | Status: DC | PRN
  Administered 2023-02-27: 1000 mL

## 2023-02-27 MED ORDER — SODIUM CHLORIDE 0.9 % IV SOLN
INTRAVENOUS | Status: DC | PRN
Start: 2023-02-27 — End: 2023-02-27

## 2023-02-27 MED ORDER — PROPOFOL 10 MG/ML IV BOLUS
INTRAVENOUS | Status: DC | PRN
  Administered 2023-02-27: 250 mg via INTRAVENOUS

## 2023-02-27 MED ORDER — CEFAZOLIN SODIUM-DEXTROSE 2-4 GM/100ML-% IV SOLN
2.0000 g | INTRAVENOUS | Status: AC
  Administered 2023-02-27: 2 g via INTRAVENOUS
  Filled 2023-02-27: qty 100

## 2023-02-27 MED ORDER — FENTANYL CITRATE (PF) 250 MCG/5ML IJ SOLN
INTRAMUSCULAR | Status: DC | PRN
  Administered 2023-02-27 (×2): 50 ug via INTRAVENOUS

## 2023-02-27 SURGICAL SUPPLY — 71 items
BAG COUNTER SPONGE SURGICOUNT (BAG) ×2 IMPLANT
BAG SPNG CNTER NS LX DISP (BAG) ×1
BANDAGE ESMARK 6X9 LF (GAUZE/BANDAGES/DRESSINGS) ×2 IMPLANT
BNDG ADH 5X4 AIR PERM ELC (GAUZE/BANDAGES/DRESSINGS) ×1
BNDG CMPR 5X6 CHSV STRCH STRL (GAUZE/BANDAGES/DRESSINGS)
BNDG CMPR 6 X 5 YARDS HK CLSR (GAUZE/BANDAGES/DRESSINGS) ×1
BNDG CMPR 9X6 STRL LF SNTH (GAUZE/BANDAGES/DRESSINGS)
BNDG COHESIVE 4X5 WHT NS (GAUZE/BANDAGES/DRESSINGS) IMPLANT
BNDG COHESIVE 6X5 TAN ST LF (GAUZE/BANDAGES/DRESSINGS) ×2 IMPLANT
BNDG ELASTIC 4X5.8 VLCR STR LF (GAUZE/BANDAGES/DRESSINGS) ×2 IMPLANT
BNDG ELASTIC 6INX 5YD STR LF (GAUZE/BANDAGES/DRESSINGS) IMPLANT
BNDG ELASTIC 6X5.8 VLCR STR LF (GAUZE/BANDAGES/DRESSINGS) ×2 IMPLANT
BNDG ESMARK 6X9 LF (GAUZE/BANDAGES/DRESSINGS)
BNDG GAUZE DERMACEA FLUFF 4 (GAUZE/BANDAGES/DRESSINGS) ×4 IMPLANT
BNDG GZE DERMACEA 4 6PLY (GAUZE/BANDAGES/DRESSINGS) ×1
BRUSH SCRUB EZ PLAIN DRY (MISCELLANEOUS) ×4 IMPLANT
COVER SURGICAL LIGHT HANDLE (MISCELLANEOUS) ×4 IMPLANT
CUFF TOURN SGL QUICK 18X4 (TOURNIQUET CUFF) IMPLANT
CUFF TOURN SGL QUICK 24 (TOURNIQUET CUFF)
CUFF TOURN SGL QUICK 34 (TOURNIQUET CUFF)
CUFF TRNQT CYL 24X4X16.5-23 (TOURNIQUET CUFF) IMPLANT
CUFF TRNQT CYL 34X4.125X (TOURNIQUET CUFF) IMPLANT
DRAPE C-ARM 42X72 X-RAY (DRAPES) IMPLANT
DRAPE C-ARMOR (DRAPES) ×2 IMPLANT
DRAPE U-SHAPE 47X51 STRL (DRAPES) ×2 IMPLANT
DRSG ADAPTIC 3X8 NADH LF (GAUZE/BANDAGES/DRESSINGS) ×2 IMPLANT
DRSG MEPILEX POST OP 4X8 (GAUZE/BANDAGES/DRESSINGS) IMPLANT
ELECT REM PT RETURN 9FT ADLT (ELECTROSURGICAL) ×1
ELECTRODE REM PT RTRN 9FT ADLT (ELECTROSURGICAL) ×2 IMPLANT
EXTRACTOR BROKEN SCREW 3 (INSTRUMENTS) IMPLANT
GAUZE SPONGE 4X4 12PLY STRL (GAUZE/BANDAGES/DRESSINGS) ×2 IMPLANT
GLOVE BIO SURGEON STRL SZ7.5 (GLOVE) ×2 IMPLANT
GLOVE BIO SURGEON STRL SZ8 (GLOVE) ×2 IMPLANT
GLOVE BIOGEL PI IND STRL 7.5 (GLOVE) ×2 IMPLANT
GLOVE BIOGEL PI IND STRL 8 (GLOVE) ×2 IMPLANT
GLOVE SURG ORTHO LTX SZ7.5 (GLOVE) ×4 IMPLANT
GOWN STRL REUS W/ TWL LRG LVL3 (GOWN DISPOSABLE) ×4 IMPLANT
GOWN STRL REUS W/ TWL XL LVL3 (GOWN DISPOSABLE) ×2 IMPLANT
GOWN STRL REUS W/TWL LRG LVL3 (GOWN DISPOSABLE) ×2
GOWN STRL REUS W/TWL XL LVL3 (GOWN DISPOSABLE) ×1
KIT BASIN OR (CUSTOM PROCEDURE TRAY) ×2 IMPLANT
KIT TURNOVER KIT B (KITS) ×2 IMPLANT
MANIFOLD NEPTUNE II (INSTRUMENTS) ×2 IMPLANT
NDL 22X1.5 STRL (OR ONLY) (MISCELLANEOUS) IMPLANT
NEEDLE 22X1.5 STRL (OR ONLY) (MISCELLANEOUS) IMPLANT
NS IRRIG 1000ML POUR BTL (IV SOLUTION) ×2 IMPLANT
PACK ORTHO EXTREMITY (CUSTOM PROCEDURE TRAY) ×2 IMPLANT
PAD ARMBOARD 7.5X6 YLW CONV (MISCELLANEOUS) ×4 IMPLANT
PAD CAST 4YDX4 CTTN HI CHSV (CAST SUPPLIES) IMPLANT
PADDING CAST COTTON 4X4 STRL (CAST SUPPLIES) ×1
PADDING CAST COTTON 6X4 STRL (CAST SUPPLIES) ×6 IMPLANT
SPONGE T-LAP 18X18 ~~LOC~~+RFID (SPONGE) ×2 IMPLANT
STAPLER VISISTAT 35W (STAPLE) IMPLANT
STOCKINETTE IMPERVIOUS LG (DRAPES) ×2 IMPLANT
STRIP CLOSURE SKIN 1/2X4 (GAUZE/BANDAGES/DRESSINGS) IMPLANT
SUCTION TUBE FRAZIER 10FR DISP (SUCTIONS) IMPLANT
SUT ETHILON 2 0 FS 18 (SUTURE) IMPLANT
SUT PDS AB 2-0 CT1 27 (SUTURE) IMPLANT
SUT VIC AB 0 CT1 27 (SUTURE) ×1
SUT VIC AB 0 CT1 27XBRD ANBCTR (SUTURE) IMPLANT
SUT VIC AB 1 CT1 27 (SUTURE) ×1
SUT VIC AB 1 CT1 27XBRD ANBCTR (SUTURE) IMPLANT
SUT VIC AB 2-0 CT1 27 (SUTURE)
SUT VIC AB 2-0 CT1 TAPERPNT 27 (SUTURE) IMPLANT
SYR CONTROL 10ML LL (SYRINGE) IMPLANT
TOWEL GREEN STERILE (TOWEL DISPOSABLE) ×4 IMPLANT
TOWEL GREEN STERILE FF (TOWEL DISPOSABLE) ×4 IMPLANT
TUBE CONNECTING 12X1/4 (SUCTIONS) ×2 IMPLANT
UNDERPAD 30X36 HEAVY ABSORB (UNDERPADS AND DIAPERS) ×2 IMPLANT
WATER STERILE IRR 1000ML POUR (IV SOLUTION) ×4 IMPLANT
YANKAUER SUCT BULB TIP NO VENT (SUCTIONS) ×2 IMPLANT

## 2023-02-27 NOTE — Anesthesia Procedure Notes (Signed)
Procedure Name: LMA Insertion Date/Time: 02/27/2023 8:40 AM  Performed by: Marena Chancy, CRNAPre-anesthesia Checklist: Patient identified, Emergency Drugs available, Suction available and Patient being monitored Patient Re-evaluated:Patient Re-evaluated prior to induction Oxygen Delivery Method: Circle System Utilized Preoxygenation: Pre-oxygenation with 100% oxygen Induction Type: IV induction Ventilation: Mask ventilation without difficulty LMA: LMA inserted LMA Size: 4.0 Number of attempts: 1 Airway Equipment and Method: Bite block Placement Confirmation: positive ETCO2 Tube secured with: Tape Dental Injury: Teeth and Oropharynx as per pre-operative assessment

## 2023-02-27 NOTE — Discharge Instructions (Addendum)
[facility] Discharge Instructions   General Discharge Instructions   WEIGHT BEARING STATUS: Weight-bear as tolerated  RANGE OF MOTION/ACTIVITY: No motion restrictions, slowly increase activity  Bone health:   Review the following resource for additional information regarding bone health  [email or web address]  Wound Care: Daily wound care starting on 03/01/2023.  Please see below   Discharge Wound Care Instructions  Do NOT apply any ointments, solutions or lotions to pin sites or surgical wounds.  These prevent needed drainage and even though solutions like hydrogen peroxide kill bacteria, they also damage cells lining the pin sites that help fight infection.  Applying lotions or ointments can keep the wounds moist and can cause them to breakdown and open up as well. This can increase the risk for infection. When in doubt call the office.  Surgical incisions should be dressed daily.  If any drainage is noted, use one layer of adaptic or Mepitel, then gauze, Kerlix, and an ace wrap.  [email or web address] [email or web address]_[email or web address]  [email or web address]  These dressing supplies should be available at local medical supply stores ([employer], [employer], etc). They are not usually carried at places like [facility], [facility], [facility], etc  Once the incision is completely dry and without drainage, it may be left open to air out.  Showering may begin 36-48 hours later.  Cleaning gently with soap and water.  Traumatic wounds should be dressed daily as well.    One layer of adaptic, gauze, Kerlix, then ace wrap.  The adaptic can be discontinued once the draining has ceased    If you have a wet to dry dressing: wet the gauze with saline the  squeeze as much saline out so the gauze is moist (not soaking wet), place moistened gauze over wound, then place a dry gauze over the moist one, followed by Kerlix wrap, then ace wrap. Diet: as you were eating previously.  Can use over the counter stool softeners and bowel preparations, such as Miralax, to help with bowel movements.  Narcotics can be constipating.  Be sure to drink plenty of fluids  PAIN MEDICATION USE AND EXPECTATIONS  You have likely been given narcotic medications to help control your pain.  After a traumatic event that results in an fracture (broken bone) with or without surgery, it is ok to use narcotic pain medications to help control one's pain.  We understand that everyone responds to pain differently and each individual patient will be evaluated on a regular basis for the continued need for narcotic medications. Ideally, narcotic medication use should last no more than 6-8 weeks (coinciding with fracture healing).   As a patient it is your responsibility as well to monitor narcotic medication use and report the amount and frequency you use these medications when you come to your office visit.   We would also advise that if you are using narcotic medications, you should take a dose prior to therapy to maximize you participation.  IF YOU ARE ON NARCOTIC MEDICATIONS IT IS NOT PERMISSIBLE TO OPERATE A MOTOR VEHICLE (MOTORCYCLE/CAR/TRUCK/MOPED) OR HEAVY MACHINERY DO NOT MIX NARCOTICS WITH OTHER CNS (CENTRAL NERVOUS SYSTEM) DEPRESSANTS SUCH AS ALCOHOL   POST-OPERATIVE OPIOID TAPER INSTRUCTIONS: It is important to wean off of your opioid medication as soon as possible. If you do not need pain medication after your surgery it is ok to stop day one. Opioids include: Codeine, Hydrocodone(Norco, Vicodin), Oxycodone(Percocet, oxycontin) and hydromorphone amongst others.  Long term and even short term use of opiods can cause: Increased pain  response Dependence Constipation Depression Respiratory depression And more.  Withdrawal symptoms can include Flu like symptoms Nausea, vomiting And more Techniques to manage these symptoms Hydrate well Eat regular healthy meals Stay active Use relaxation techniques(deep breathing, meditating, yoga) Do Not substitute Alcohol to help with tapering If you have been on opioids for less than two weeks and do not have pain than it is ok to stop all together.  Plan to wean off of opioids This plan should start within one week post op of your fracture surgery  Maintain the same interval or time between taking each dose and first decrease the dose.  Cut the total daily intake of opioids by one tablet each day Next start to increase the time between doses. The last dose that should be eliminated is the evening dose.    STOP SMOKING OR USING NICOTINE PRODUCTS!!!!  As discussed nicotine severely impairs your body's ability to heal surgical and traumatic wounds but also impairs bone healing.  Wounds and bone heal by forming microscopic blood vessels (angiogenesis) and nicotine is a vasoconstrictor (essentially, shrinks blood vessels).  Therefore, if vasoconstriction occurs to these microscopic blood vessels they essentially disappear and are unable to deliver necessary nutrients to the healing tissue.  This is one modifiable factor that you can do to dramatically increase your chances of healing your injury.    (This means no smoking, no nicotine gum, patches, etc)  DO NOT USE NONSTEROIDAL ANTI-INFLAMMATORY DRUGS (NSAID'S)  Using products such as Advil (ibuprofen), Aleve (naproxen), Motrin (ibuprofen) for additional pain control during fracture healing can delay and/or prevent the healing response.  If you would like to take over the counter (OTC) medication, Tylenol (acetaminophen) is ok.  However, some narcotic medications that are given for pain control contain acetaminophen as well. Therefore,  you should not exceed more than 4000 mg of tylenol in a day if you do not have liver disease.  Also note that there are may OTC medicines, such as cold medicines and allergy medicines that my contain tylenol as well.  If you have any questions about medications and/or interactions please ask your doctor/PA or your pharmacist.      ICE AND ELEVATE INJURED/OPERATIVE EXTREMITY  Using ice and elevating the injured extremity above your heart can help with swelling and pain control.  Icing in a pulsatile fashion, such as 20 minutes on and 20 minutes off, can be followed.    Do not place ice directly on skin. Make sure there is a barrier between to skin and the ice pack.    Using frozen items such as frozen peas works well as the conform nicely to the are that needs to be iced.  USE AN ACE WRAP OR TED HOSE FOR SWELLING CONTROL  In addition to icing and elevation, Ace wraps or TED hose are used to help limit and resolve swelling.  It is recommended to use Ace wraps or TED hose until you are informed to stop.    When using Ace Wraps start the wrapping distally (farthest away from the body) and wrap proximally (closer to the body)   Example: If you had surgery on your leg or thing and you do not have a splint on, start the ace wrap at the toes and work your way up to the thigh        If you had surgery on your upper extremity and do not have a splint on, start the ace wrap at your fingers and work your way  up to the upper arm  IF YOU ARE IN A SPLINT OR CAST DO NOT REMOVE IT FOR ANY REASON   If your splint gets wet for any reason please contact the office immediately. You may shower in your splint or cast as long as you keep it dry.  This can be done by wrapping in a cast cover or garbage back (or similar)  Do Not stick any thing down your splint or cast such as pencils, money, or hangers to try and scratch yourself with.  If you feel itchy take benadryl as prescribed on the bottle for itching  IF YOU ARE IN  A CAM BOOT (BLACK BOOT)  You may remove boot periodically. Perform daily dressing changes as noted below.  Wash the liner of the boot regularly and wear a sock when wearing the boot. It is recommended that you sleep in the boot until told otherwise    Call office for the following: Temperature greater than 101F Persistent nausea and vomiting Severe uncontrolled pain Redness, tenderness, or signs of infection (pain, swelling, redness, odor or green/yellow discharge around the site) Difficulty breathing, headache or visual disturbances Hives Persistent dizziness or light-headedness Extreme fatigue Any other questions or concerns you may have after discharge  In an emergency, call 911 or go to an Emergency Department at a nearby hospital  HELPFUL INFORMATION  If you had a block, it will wear off between 8-24 hrs postop typically.  This is period when your pain may go from nearly zero to the pain you would have had postop without the block.  This is an abrupt transition but nothing dangerous is happening.  You may take an extra dose of narcotic when this happens.  You should wean off your narcotic medicines as soon as you are able.  Most patients will be off or using minimal narcotics before their first postop appointment.   We suggest you use the pain medication the first night prior to going to bed, in order to ease any pain when the anesthesia wears off. You should avoid taking pain medications on an empty stomach as it will make you nauseous.  Do not drink alcoholic beverages or take illicit drugs when taking pain medications.  In most states it is against the law to drive while you are in a splint or sling.  And certainly against the law to drive while taking narcotics.  You may return to work/school in the next couple of days when you feel up to it.   Pain medication may make you constipated.  Below are a few solutions to try in this order: Decrease the amount of pain medication if  you aren't having pain. Drink lots of decaffeinated fluids. Drink prune juice and/or each dried prunes  If the first 3 don't work start with additional solutions Take Colace - an over-the-counter stool softener Take Senokot - an over-the-counter laxative Take Miralax - a stronger over-the-counter laxative     CALL THE OFFICE WITH ANY QUESTIONS OR CONCERNS: [phone]   VISIT OUR WEBSITE FOR ADDITIONAL INFORMATION: [email or web address]

## 2023-02-27 NOTE — Progress Notes (Signed)
Orthopedic Tech Progress Note Patient Details:  BREYER TEJERA 05-07-1986 [id number]  Ortho Devices Type of Ortho Device: Crutches Ortho Device/Splint Interventions: Ordered, Application, Adjustment   Post Interventions Patient Tolerated: Well Instructions Provided: Adjustment of device, Care of device, Poper ambulation with device  Memorie Yokoyama OTR/L 02/27/2023, 1:08 PM

## 2023-02-27 NOTE — Anesthesia Postprocedure Evaluation (Signed)
Anesthesia Post Note  Patient: Nathan Nelson  Procedure(s) Performed: HARDWARE REMOVAL (Left: Leg Upper)     Patient location during evaluation: PACU Anesthesia Type: General Level of consciousness: sedated and patient cooperative Pain management: pain level controlled Vital Signs Assessment: post-procedure vital signs reviewed and stable Respiratory status: spontaneous breathing Cardiovascular status: stable Anesthetic complications: no   No notable events documented.  Last Vitals:  Vitals:   02/27/23 1145 02/27/23 1200  BP: (!) 145/97 (!) 146/99  Pulse: 99 100  Resp: 19 20  Temp:  36.4 C  SpO2: 93% 92%    Last Pain:  Vitals:   02/27/23 1145  TempSrc:   PainSc: 2                  [name]

## 2023-02-27 NOTE — Anesthesia Postprocedure Evaluation (Deleted)
Anesthesia Post Note  Patient: Nathan Nelson  Procedure(s) Performed: HARDWARE REMOVAL (Left: Leg Upper)     Patient location during evaluation: PACU Anesthesia Type: General Level of consciousness: sedated and patient cooperative Pain management: pain level controlled Vital Signs Assessment: post-procedure vital signs reviewed and stable Respiratory status: spontaneous breathing Cardiovascular status: stable Anesthetic complications: no   No notable events documented.  Last Vitals:  Vitals:   02/27/23 1030 02/27/23 1045  BP: (!) 129/92 (!) 164/104  Pulse: 95 (!) 107  Resp: (!) 22 (!) 21  Temp: (!) 36.1 C   SpO2: 93% 94%    Last Pain:  Vitals:   02/27/23 1030  TempSrc:   PainSc: Asleep                 [name]

## 2023-02-27 NOTE — Transfer of Care (Signed)
Immediate Anesthesia Transfer of Care Note  Patient: Nathan Nelson  Procedure(s) Performed: HARDWARE REMOVAL (Left: Leg Upper)  Patient Location: PACU  Anesthesia Type:General  Level of Consciousness: drowsy and patient cooperative  Airway & Oxygen Therapy: Patient Spontanous Breathing and Patient connected to nasal cannula oxygen  Post-op Assessment: Report given to RN and Post -op Vital signs reviewed and stable  Post vital signs: Reviewed and stable  Last Vitals:  Vitals Value Taken Time  BP 129/92 02/27/23 1030  Temp 36.1 C 02/27/23 1030  Pulse 107 02/27/23 1034  Resp 23 02/27/23 1034  SpO2 94 % 02/27/23 1034  Vitals shown include unfiled device data.  Last Pain:  Vitals:   02/27/23 1030  TempSrc:   PainSc: Asleep         Complications: No notable events documented.

## 2023-02-27 NOTE — Progress Notes (Addendum)
Notified Dr. Renold Don of potassium level 2.7. No new orders at present. Dr. Carola Frost aware of potassium level. No new orders , will proceed with surgery.

## 2023-02-28 ENCOUNTER — Encounter (HOSPITAL_COMMUNITY): Payer: Self-pay | Admitting: Orthopedic Surgery

## 2023-03-02 ENCOUNTER — Ambulatory Visit (INDEPENDENT_AMBULATORY_CARE_PROVIDER_SITE_OTHER): Payer: BC Managed Care – PPO | Admitting: Family Medicine

## 2023-03-02 ENCOUNTER — Encounter: Payer: Self-pay | Admitting: Family Medicine

## 2023-03-02 VITALS — BP 140/89 | HR 77 | Temp 98.5°F | Ht 64.5 in

## 2023-03-02 DIAGNOSIS — E559 Vitamin D deficiency, unspecified: Secondary | ICD-10-CM | POA: Diagnosis not present

## 2023-03-02 DIAGNOSIS — I10 Essential (primary) hypertension: Secondary | ICD-10-CM | POA: Diagnosis not present

## 2023-03-02 DIAGNOSIS — R7303 Prediabetes: Secondary | ICD-10-CM

## 2023-03-02 DIAGNOSIS — E782 Mixed hyperlipidemia: Secondary | ICD-10-CM

## 2023-03-02 MED ORDER — ALPRAZOLAM 0.25 MG PO TABS: 0.2500 mg | ORAL_TABLET | Freq: Every day | ORAL | 0 refills | Status: DC | PRN

## 2023-03-02 MED ORDER — CARVEDILOL 6.25 MG PO TABS: 6.2500 mg | ORAL_TABLET | Freq: Two times a day (BID) | ORAL | 1 refills | Status: DC

## 2023-03-02 NOTE — Op Note (Addendum)
02/27/2023  10:59 PM  PATIENT:  Nathan Nelson  12/23/1985 male   MEDICAL RECORD NUMBER: [id number]  PRE-OPERATIVE DIAGNOSIS:  SYMPTOMATIC HARDWARE LEFT FEMUR  POST-OPERATIVE DIAGNOSIS:  SYMPTOMATIC HARDWARE LEFT FEMUR  PROCEDURE:  REMOVAL OF DEEP IMPLANT LEFT FEMUR  SURGEON:  Doralee Albino. Carola Frost, M.D.  ASSISTANT:  None.  ANESTHESIA:  General.  COMPLICATIONS:  None.  TOURNIQUET: None.  SPECIMENS: None.  ESTIMATED BLOOD LOSS:  MIN  DISPOSITION:  To PACU.  CONDITION:  Stable.  DELAY START OF DVT PROPHYLAXIS BECAUSE OF BLEEDING RISK: NO   BRIEF SUMMARY OF INDICATION FOR PROCEDURE:  Patient is a pleasant 37 y.o. who underwent plate fixation of a left femur fracture with subsequent healing. Despite conservative measures, hardware related symptoms have persisted. Therefore, I discussed with the patient the risks and benefits of surgical removal including infection, nerve or vessel injury, failure to alleviate symptoms, occult nonunion, re-fracture, DVT, PE, and multiple others. He did wish to proceed.   BRIEF SUMMARY OF PROCEDURE:  The patient was taken to the operating room after administration of 2 g of Ancef.  General anesthesia was induced. The left lower extremity was prepped and draped in usual sterile fashion.  No tourniquet was used during the procedure.  C-arm was brought in to confirm position of the hardware.  I remade the old incision and dissected sharply down to the plate, elevating the soft tissues. I identified and removed all screws.  The plate was gently rocked to assist with this and then extracted atraumatically. Final x-rays confirmed removal of all hardware and a healed fracture.   The wound was irrigated thoroughly and closed in standard fashion with vicryl and nylon. A sterile gently compressive dressing was applied.  The patient was taken to the PACU in stable condition.     PROGNOSIS: Patient will be weightbearing as tolerated with aggressive active and  passive motion of the knee and ankle. Bleeding would be anticipated. He may change or remove his dressing in 48 hours and shower. Patient will follow up in 10 days for removal of sutures.       Doralee Albino. Carola Frost, M.D.

## 2023-03-02 NOTE — Progress Notes (Signed)
Established patient visit   Patient: Nathan Nelson   DOB: 02-Aug-1985   37 y.o. Male  MRN: [id number] Visit Date: 03/02/2023  Today's healthcare provider: Jacky Kindle, FNP  Introduced to nurse practitioner role and practice setting.  All questions answered.  Discussed provider/patient relationship and expectations.  Subjective    HPI HPI     low potassium    Additional comments: No other concerns today, would like to have left leg evaluated and rewrapped       Last edited by Rolly Salter, CMA on 03/02/2023 11:24 AM.     The patient, with a history of hypertension and prediabetes, recently underwent a surgical procedure to remove hardware from a previous surgery. The patient reports that the procedure was a bit of a struggle due to inflammation around one of the screws. The patient also reports that his potassium levels were found to be low during his hospital stay, which he was told could be due to his blood pressure medication, chlorthalidone. The patient has been experiencing anxiety, particularly around the time of his surgery, and takes Xanax as needed for this. The patient's blood pressure has been running high, with readings ranging from 120 to 164. The patient's weight has remained stable. The patient also reports a concern about a lump he felt in his abdomen, which was not causing any pain or other issues.  Medications: Outpatient Medications Prior to Visit  Medication Sig   amLODipine (NORVASC) 10 MG tablet Take 1 tablet (10 mg total) by mouth daily.   buPROPion (WELLBUTRIN XL) 300 MG 24 hr tablet Take 1 tablet (300 mg total) by mouth daily.   ketorolac (TORADOL) 10 MG tablet Take 1 tablet (10 mg total) by mouth every 6 (six) hours as needed.   oxyCODONE-acetaminophen (PERCOCET) 5-325 MG tablet Take 1-2 tablets by mouth every 8 (eight) hours as needed for severe pain (pain score 7-10).   [DISCONTINUED] ALPRAZolam (XANAX) 0.25 MG tablet Take 1 tablet (0.25 mg total)  by mouth daily as needed for anxiety.   [DISCONTINUED] chlorthalidone (HYGROTON) 50 MG tablet Take 1 tablet (50 mg total) by mouth daily.   [DISCONTINUED] Vitamin D, Ergocalciferol, (DRISDOL) 1.25 MG (50000 UNIT) CAPS capsule Take 1 capsule (50,000 Units total) by mouth every 7 (seven) days. Take with fat containing meal.   methocarbamol (ROBAXIN) 500 MG tablet Take 1-2 tablets (500-1,000 mg total) by mouth every 6 (six) hours as needed. (Patient not taking: Reported on 03/02/2023)   ondansetron (ZOFRAN-ODT) 4 MG disintegrating tablet Take 1 tablet (4 mg total) by mouth every 8 (eight) hours as needed. (Patient not taking: Reported on 03/02/2023)   [DISCONTINUED] acetaminophen (TYLENOL) 500 MG tablet Take 500 mg by mouth every 6 (six) hours as needed for moderate pain. (Patient not taking: Reported on 03/02/2023)   No facility-administered medications prior to visit.    Review of Systems      Objective    BP (!) 140/89 (BP Location: Left Arm, Patient Position: Sitting, Cuff Size: Large)   Pulse 77   Temp 98.5 F (36.9 C) (Oral)   Ht 5' 4.5" (1.638 m)   SpO2 96%   BMI 37.52 kg/m     Physical Exam Vitals and nursing note reviewed.  Constitutional:      Appearance: Normal appearance. He is obese.  HENT:     Head: Normocephalic and atraumatic.  Cardiovascular:     Rate and Rhythm: Normal rate and regular rhythm.     Pulses: Normal  pulses.     Heart sounds: Normal heart sounds.  Pulmonary:     Effort: Pulmonary effort is normal.     Breath sounds: Normal breath sounds.  Abdominal:     General: Bowel sounds are normal.     Palpations: Abdomen is soft.     Tenderness: There is no abdominal tenderness. There is no left CVA tenderness or guarding.     Comments: Increased striae on R side of umbilicus; likely the concern for firmness variation   Musculoskeletal:        General: Signs of injury present. Normal range of motion.     Cervical back: Normal range of motion.      Comments: LLE wrapped with sugical dressing, and ACE; crutches present. Pt in w/c.   Skin:    General: Skin is warm and dry.     Capillary Refill: Capillary refill takes less than 2 seconds.  Neurological:     General: No focal deficit present.     Mental Status: He is alert and oriented to person, place, and time.  Psychiatric:        Mood and Affect: Mood normal.        Behavior: Behavior normal.        Thought Content: Thought content normal.        Judgment: Judgment normal.     No results found for any visits on 03/02/23.  Assessment & Plan     Problem List Items Addressed This Visit       Other   Avitaminosis D - Primary   Relevant Orders   Vitamin D (25 hydroxy)   Hyperlipidemia   Relevant Medications   carvedilol (COREG) 6.25 MG tablet   Other Relevant Orders   Lipid panel   Morbid obesity (HCC)   Prediabetes   Relevant Orders   Hemoglobin A1c   Other Visit Diagnoses     Essential (primary) hypertension       Relevant Medications   carvedilol (COREG) 6.25 MG tablet   Other Relevant Orders   Comprehensive Metabolic Panel (CMET)   CBC with Differential/Platelet   TSH     Post-operative care Recent hardware removal with some difficulty due to screw placement and inflammation. No current complications reported. -Advise gentle cleaning of the surgical site with warm water and gentle soap, ensuring it is kept dry. ACE wrap and monitor edema.  Hypokalemia Recent hospitalization with noted low potassium (2.7). Possible causes discussed include diuretic use and overhydration during procedure. -Discontinue Chlorthalidone. -Order labs to check current potassium levels and adjust treatment as necessary.  Hypertension Blood pressure readings during hospital stay were elevated. Concerns about current medication (Chlorthalidone) potentially contributing to low potassium. -Start Carvedilol 6.25mg  twice daily. -Check blood pressure at follow-up  appointment.  Anxiety Reports increased anxiety around the time of surgery. -Refill Xanax prescription as needed for anxiety.  Follow-up plans -Schedule follow-up appointment for November 14th. -Check labs for potassium, vitamin D levels, and prediabetes status. -Review new blood pressure medication effectiveness and side effects. -Morbid obesity remains with HTN, HLD, prediabetes. Continue to recommend balanced, lower carb meals. Smaller meal size, adding snacks. Choosing water as drink of choice and increasing purposeful exercise. Body mass index is 37.52 kg/m. I continue to emphasize the importance of healthy weight management Discussed diet and exercise       I, Jacky Kindle, FNP, have reviewed all documentation for this visit. The documentation on 03/02/23 for the exam, diagnosis, procedures, and orders are all accurate  and complete.  Jacky Kindle, FNP  [facility] [phone] (phone) [phone] (fax)  [facility]

## 2023-03-03 LAB — CBC WITH DIFFERENTIAL/PLATELET
Basophils Absolute: 0.1 10*3/uL (ref 0.0–0.2)
Basos: 1 %
EOS (ABSOLUTE): 0.2 10*3/uL (ref 0.0–0.4)
Eos: 2 %
Hematocrit: 45.3 % (ref 37.5–51.0)
Hemoglobin: 15.1 g/dL (ref 13.0–17.7)
Immature Grans (Abs): 0 10*3/uL (ref 0.0–0.1)
Immature Granulocytes: 0 %
Lymphocytes Absolute: 3 10*3/uL (ref 0.7–3.1)
Lymphs: 27 %
MCH: 28.5 pg (ref 26.6–33.0)
MCHC: 33.3 g/dL (ref 31.5–35.7)
MCV: 86 fL (ref 79–97)
Monocytes Absolute: 1.1 10*3/uL — ABNORMAL HIGH (ref 0.1–0.9)
Monocytes: 9 %
Neutrophils Absolute: 6.9 10*3/uL (ref 1.4–7.0)
Neutrophils: 61 %
Platelets: 493 10*3/uL — ABNORMAL HIGH (ref 150–450)
RBC: 5.3 x10E6/uL (ref 4.14–5.80)
RDW: 12.9 % (ref 11.6–15.4)
WBC: 11.3 10*3/uL — ABNORMAL HIGH (ref 3.4–10.8)

## 2023-03-03 LAB — COMPREHENSIVE METABOLIC PANEL
ALT: 19 [IU]/L (ref 0–44)
AST: 16 [IU]/L (ref 0–40)
Albumin: 4.4 g/dL (ref 4.1–5.1)
Alkaline Phosphatase: 98 [IU]/L (ref 44–121)
BUN/Creatinine Ratio: 13 (ref 9–20)
BUN: 17 mg/dL (ref 6–20)
Bilirubin Total: 0.4 mg/dL (ref 0.0–1.2)
CO2: 27 mmol/L (ref 20–29)
Calcium: 10.4 mg/dL — ABNORMAL HIGH (ref 8.7–10.2)
Chloride: 93 mmol/L — ABNORMAL LOW (ref 96–106)
Creatinine, Ser: 1.28 mg/dL — ABNORMAL HIGH (ref 0.76–1.27)
Globulin, Total: 3.3 g/dL (ref 1.5–4.5)
Glucose: 92 mg/dL (ref 70–99)
Potassium: 3.9 mmol/L (ref 3.5–5.2)
Sodium: 138 mmol/L (ref 134–144)
Total Protein: 7.7 g/dL (ref 6.0–8.5)
eGFR: 74 mL/min/{1.73_m2} (ref >59)

## 2023-03-03 LAB — LIPID PANEL
Chol/HDL Ratio: 5.3 {ratio} — ABNORMAL HIGH (ref 0.0–5.0)
Cholesterol, Total: 205 mg/dL — ABNORMAL HIGH (ref 100–199)
HDL: 39 mg/dL — ABNORMAL LOW (ref >39)
LDL Chol Calc (NIH): 145 mg/dL — ABNORMAL HIGH (ref 0–99)
Triglycerides: 117 mg/dL (ref 0–149)
VLDL Cholesterol Cal: 21 mg/dL (ref 5–40)

## 2023-03-03 LAB — HEMOGLOBIN A1C
Est. average glucose Bld gHb Est-mCnc: 120 mg/dL
Hgb A1c MFr Bld: 5.8 % — ABNORMAL HIGH (ref 4.8–5.6)

## 2023-03-03 LAB — VITAMIN D 25 HYDROXY (VIT D DEFICIENCY, FRACTURES): Vit D, 25-Hydroxy: 55.9 ng/mL (ref 30.0–100.0)

## 2023-03-03 LAB — TSH: TSH: 0.515 u[IU]/mL (ref 0.450–4.500)

## 2023-03-03 NOTE — Progress Notes (Signed)
Potassium remains stable; however, acute elevation in creatinine. Pre-diabetes remains. Continue to recommend balanced, lower carb meals. Smaller meal size, adding snacks. Choosing water as drink of choice and increasing purposeful exercise. Cholesterol is increased; I continue to recommend diet low in saturated fat and regular exercise - 30 min at least 5 times per week, once cleared from ortho. Vit D looks great!

## 2023-03-06 ENCOUNTER — Ambulatory Visit: Payer: BC Managed Care – PPO | Admitting: Family Medicine

## 2023-03-14 DIAGNOSIS — T8484XD Pain due to internal orthopedic prosthetic devices, implants and grafts, subsequent encounter: Secondary | ICD-10-CM | POA: Diagnosis not present

## 2023-03-16 DIAGNOSIS — Z419 Encounter for procedure for purposes other than remedying health state, unspecified: Secondary | ICD-10-CM | POA: Diagnosis not present

## 2023-03-29 ENCOUNTER — Ambulatory Visit: Payer: BC Managed Care – PPO | Admitting: Family Medicine

## 2023-04-15 DIAGNOSIS — Z419 Encounter for procedure for purposes other than remedying health state, unspecified: Secondary | ICD-10-CM | POA: Diagnosis not present

## 2023-04-25 ENCOUNTER — Ambulatory Visit: Payer: Medicaid Other | Admitting: Cardiology

## 2023-04-25 DIAGNOSIS — T8484XD Pain due to internal orthopedic prosthetic devices, implants and grafts, subsequent encounter: Secondary | ICD-10-CM | POA: Diagnosis not present

## 2023-04-25 NOTE — Progress Notes (Unsigned)
Cardiology Office Note:  .   Date:  04/25/2023  ID:  Nathan Nelson, DOB [birth year], MRN [id number] PCP: Jacky Kindle, FNP  [facility] Providers Cardiologist:  None { Click to update primary MD,subspecialty MD or APP then REFRESH:1}  History of Present Illness: .   Nathan Nelson is a 37 y.o.   Discussed the use of AI scribe software for clinical note transcription with the patient, who gave verbal consent to proceed.  History of Present Illness            ROS  Labs   Lab Results  Component Value Date   CHOL 205 (H) 03/02/2023   HDL 39 (L) 03/02/2023   LDLCALC 145 (H) 03/02/2023   TRIG 117 03/02/2023   CHOLHDL 5.3 (H) 03/02/2023   Lab Results  Component Value Date   NA 138 03/02/2023   K 3.9 03/02/2023   CO2 27 03/02/2023   GLUCOSE 92 03/02/2023   BUN 17 03/02/2023   CREATININE 1.28 (H) 03/02/2023   CALCIUM 10.4 (H) 03/02/2023   EGFR 74 03/02/2023   GFRNONAA >60 02/27/2023      Latest Ref Rng & Units 03/02/2023   11:46 AM 02/27/2023    7:00 AM 05/10/2022    9:48 AM  BMP  Glucose 70 - 99 mg/dL 92  160  99   BUN 6 - 20 mg/dL 17  14  18    Creatinine 0.76 - 1.27 mg/dL 7.37  1.06  2.69   BUN/Creat Ratio 9 - 20 13     Sodium 134 - 144 mmol/L 138  135  135   Potassium 3.5 - 5.2 mmol/L 3.9  2.7  2.9   Chloride 96 - 106 mmol/L 93  96  98   CO2 20 - 29 mmol/L 27  26  29    Calcium 8.7 - 10.2 mg/dL 48.5  9.6  9.1       Latest Ref Rng & Units 03/02/2023   11:46 AM 02/27/2023    7:00 AM 05/10/2022    9:48 AM  CBC  WBC 3.4 - 10.8 x10E3/uL 11.3  11.6  10.3   Hemoglobin 13.0 - 17.7 g/dL 46.2  70.3  50.0   Hematocrit 37.5 - 51.0 % 45.3  45.9  45.1   Platelets 150 - 450 x10E3/uL 493  469  409    External Labs:  ***  Physical Exam:   VS:  There were no vitals taken for this visit.   Wt Readings from Last 3 Encounters:  02/27/23 222 lb (100.7 kg)  08/08/22 225 lb 12.8 oz (102.4 kg)  07/18/22 225 lb (102.1 kg)     Physical Exam  Studies  Reviewed: .    *** EKG:         EKG 08/08/2022: Suspect lead reversal.  No ischemia.  Poor quality. Medications and allergies    Allergies  Allergen Reactions   Lisinopril Other (See Comments)    Tightness in chest     Current Outpatient Medications:    ALPRAZolam (XANAX) 0.25 MG tablet, Take 1 tablet (0.25 mg total) by mouth daily as needed for anxiety., Disp: 90 tablet, Rfl: 0   amLODipine (NORVASC) 10 MG tablet, Take 1 tablet (10 mg total) by mouth daily., Disp: 90 tablet, Rfl: 3   buPROPion (WELLBUTRIN XL) 300 MG 24 hr tablet, Take 1 tablet (300 mg total) by mouth daily., Disp: 90 tablet, Rfl: 3   carvedilol (COREG) 6.25 MG tablet, Take 1 tablet (6.25 mg  total) by mouth 2 (two) times daily with a meal., Disp: 180 tablet, Rfl: 1   ketorolac (TORADOL) 10 MG tablet, Take 1 tablet (10 mg total) by mouth every 6 (six) hours as needed., Disp: 20 tablet, Rfl: 0   methocarbamol (ROBAXIN) 500 MG tablet, Take 1-2 tablets (500-1,000 mg total) by mouth every 6 (six) hours as needed. (Patient not taking: Reported on 03/02/2023), Disp: 60 tablet, Rfl: 0   ondansetron (ZOFRAN-ODT) 4 MG disintegrating tablet, Take 1 tablet (4 mg total) by mouth every 8 (eight) hours as needed. (Patient not taking: Reported on 03/02/2023), Disp: 20 tablet, Rfl: 0   oxyCODONE-acetaminophen (PERCOCET) 5-325 MG tablet, Take 1-2 tablets by mouth every 8 (eight) hours as needed for severe pain (pain score 7-10)., Disp: 40 tablet, Rfl: 0   ASSESSMENT AND PLAN: .      ICD-10-CM   1. Primary hypertension  I10     2. Hypercholesteremia  E78.00     3. Hyperglycemia  R73.9       1. Primary hypertension ***  2. Hypercholesteremia ***  3. Hyperglycemia ***  Assessment and Plan                {Are you ordering a CV Procedure (e.g. stress test, cath, DCCV, TEE, etc)?   Press F2        :[id number]}   Signed,  Yates Decamp, MD, [facility] 04/25/2023, 1:26 PM [facility] [address]  [address] [place], [place] [address] Phone: [phone]. Fax:  [phone]

## 2023-04-26 ENCOUNTER — Encounter: Payer: Self-pay | Admitting: Family Medicine

## 2023-05-01 ENCOUNTER — Ambulatory Visit (INDEPENDENT_AMBULATORY_CARE_PROVIDER_SITE_OTHER): Payer: Medicaid Other | Admitting: Internal Medicine

## 2023-05-01 ENCOUNTER — Encounter (HOSPITAL_BASED_OUTPATIENT_CLINIC_OR_DEPARTMENT_OTHER): Payer: Self-pay | Admitting: Internal Medicine

## 2023-05-01 VITALS — BP 120/84 | HR 79 | Ht 65.0 in | Wt 233.0 lb

## 2023-05-01 DIAGNOSIS — I10 Essential (primary) hypertension: Secondary | ICD-10-CM

## 2023-05-01 DIAGNOSIS — E782 Mixed hyperlipidemia: Secondary | ICD-10-CM | POA: Diagnosis not present

## 2023-05-01 DIAGNOSIS — R002 Palpitations: Secondary | ICD-10-CM | POA: Diagnosis not present

## 2023-05-01 DIAGNOSIS — E876 Hypokalemia: Secondary | ICD-10-CM | POA: Diagnosis not present

## 2023-05-01 NOTE — Progress Notes (Unsigned)
OFFICE NOTE  Chief Complaint:  Hypertension  Primary Care Physician: Jacky Kindle, FNP  HPI:  Nathan Nelson is a 37 y.o. male with a past medial history significant for hypertension and obstructive sleep apnea.  He was seen remotely by Dr. Eden Emms for hypertension.  At the time he reported lifestyle issues including substance use which has subsequently stopped.  He has been followed by his PCP but was not comfortable that his blood pressure was well-controlled.  He had recent knee surgery and was noted to have significant hypokalemia during that procedure.  He has been on chlorthalidone.  Repeat blood pressure today however it looks excellent at 120/84.  He reports taking chlorthalidone 50 mg daily and amlodipine 10 mg daily.  He was also prescribed carvedilol 6.25 mg twice daily but is not taking that medication.  He reports no early onset heart disease in the family but there is a history of hypertension.  He denies any chest pain or worsening shortness of breath.  He has had some recent weight gain which she is working on.  Labs indicate he had some recent hypokalemia dating back to December 2023 at which time his potassium was 2.9.  During surgery 2 months ago potassium was 2.7 however follow-up labs after repletion were 3.9.  He has not had reassessment of potassium since then.  Creatinine also was mildly elevated in October at 1.28.  He does have a degree of dyslipidemia with LDL that was as high as 250 about 3 years ago but is now down to 145 with a total cholesterol of 205, triglycerides 117 and HDL 39.  He is not on any lipid-lowering therapies.  PMHx:  Past Medical History:  Diagnosis Date   Anxiety    Arthritis    left knee   Costochondritis 06/11/2014   Hypertension    Right shoulder injury 09/28/2015   Sleep apnea    mild - does not use CPAP    Past Surgical History:  Procedure Laterality Date   HARDWARE REMOVAL Left 02/27/2023   Procedure: HARDWARE REMOVAL;  Surgeon:  Myrene Galas, MD;  Location: [facility] OR;  Service: Orthopedics;  Laterality: Left;   KNEE SURGERY Left     FAMHx:  Family History  Problem Relation Age of Onset   Hypertension Mother    Diabetes Father    Hypertension Father     SOCHx:   reports that he quit smoking about 9 years ago. His smoking use included cigarettes. He has never used smokeless tobacco. He reports current alcohol use. He reports that he does not use drugs.  ALLERGIES:  Allergies  Allergen Reactions   Lisinopril Other (See Comments)    Tightness in chest    ROS: Pertinent items noted in HPI and remainder of comprehensive ROS otherwise negative.  HOME MEDS: Current Outpatient Medications on File Prior to Visit  Medication Sig Dispense Refill   ALPRAZolam (XANAX) 0.25 MG tablet Take 1 tablet (0.25 mg total) by mouth daily as needed for anxiety. 90 tablet 0   amLODipine (NORVASC) 10 MG tablet Take 1 tablet (10 mg total) by mouth daily. 90 tablet 3   buPROPion (WELLBUTRIN XL) 300 MG 24 hr tablet Take 1 tablet (300 mg total) by mouth daily. 90 tablet 3   chlorthalidone (HYGROTON) 50 MG tablet Take 50 mg by mouth daily.     carvedilol (COREG) 6.25 MG tablet Take 1 tablet (6.25 mg total) by mouth 2 (two) times daily with a meal. (Patient not taking:  Reported on 05/01/2023) 180 tablet 1   ketorolac (TORADOL) 10 MG tablet Take 1 tablet (10 mg total) by mouth every 6 (six) hours as needed. 20 tablet 0   methocarbamol (ROBAXIN) 500 MG tablet Take 1-2 tablets (500-1,000 mg total) by mouth every 6 (six) hours as needed. (Patient not taking: Reported on 03/02/2023) 60 tablet 0   ondansetron (ZOFRAN-ODT) 4 MG disintegrating tablet Take 1 tablet (4 mg total) by mouth every 8 (eight) hours as needed. (Patient not taking: Reported on 05/01/2023) 20 tablet 0   oxyCODONE-acetaminophen (PERCOCET) 5-325 MG tablet Take 1-2 tablets by mouth every 8 (eight) hours as needed for severe pain (pain score 7-10). (Patient not taking: Reported  on 05/01/2023) 40 tablet 0   No current facility-administered medications on file prior to visit.    LABS/IMAGING: No results found for this or any previous visit (from the past 48 hours). No results found.  LIPID PANEL:    Component Value Date/Time   CHOL 205 (H) 03/02/2023 1146   TRIG 117 03/02/2023 1146   HDL 39 (L) 03/02/2023 1146   CHOLHDL 5.3 (H) 03/02/2023 1146   CHOLHDL 5.4 09/14/2009 1256   VLDL 13 09/14/2009 1256   LDLCALC 145 (H) 03/02/2023 1146     WEIGHTS: Wt Readings from Last 3 Encounters:  05/01/23 233 lb (105.7 kg)  02/27/23 222 lb (100.7 kg)  08/08/22 225 lb 12.8 oz (102.4 kg)    VITALS: BP 120/84 (BP Location: Left Arm, Patient Position: Sitting)   Pulse 79   Ht 5\' 5"  (1.651 m)   Wt 233 lb (105.7 kg)   SpO2 99%   BMI 38.77 kg/m   EXAM: General appearance: alert and no distress Neck: no carotid bruit, no JVD, and thyroid not enlarged, symmetric, no tenderness/mass/nodules Lungs: clear to auscultation bilaterally Heart: regular rate and rhythm, S1, S2 normal, no murmur, click, rub or gallop Abdomen: soft, non-tender; bowel sounds normal; no masses,  no organomegaly Extremities: extremities normal, atraumatic, no cyanosis or edema Pulses: 2+ and symmetric Skin: Skin color, texture, turgor normal. No rashes or lesions Neurologic: Grossly normal Psych: Pleasant  EKG: EKG Interpretation Date/Time:  Tuesday May 01 2023 15:31:45 EST Ventricular Rate:  79 PR Interval:  152 QRS Duration:  96 QT Interval:  380 QTC Calculation: 435 R Axis:   75  Text Interpretation: Normal sinus rhythm with sinus arrhythmia Normal ECG When compared with ECG of 20-Aug-2020 15:40, No significant change since last tracing Confirmed by Zoila Shutter [phone]) on 05/01/2023 3:39:49 PM    ASSESSMENT: Essential hypertension Hypokalemia Dyslipidemia   PLAN: 1.   Mr. Presto seems to have good blood pressure control today on his current therapies.  He is concerned  about ongoing hypokalemia.  I like to repeat some labs including metabolic profile as well as look further into his dyslipidemia.  I would also recommend a renin aldosterone ratio.  Concerned there for possible hyperaldosteronism.  This may impact treatment and consideration of possibly switching chlorthalidone over to spironolactone.  I will contact him with these results.  Will plan a follow-up visit in about 6 months or sooner.  Chrystie Nose, MD, [facility], FACP  [place]  [facility]  Medical Director of the Advanced Lipid Disorders &  Cardiovascular Risk Reduction Clinic Diplomate of the American Board of Clinical Lipidology Attending Cardiologist  Direct Dial: [phone]  Fax: [phone]  Website:  [email or web address] [email or web address] [email or web address] 05/01/2023, 3:40 PM

## 2023-05-01 NOTE — Patient Instructions (Signed)
Medication Instructions:  NO CHANGES  *If you need a refill on your cardiac medications before your next appointment, please call your pharmacy*   Lab Work: Renin-Aldosterone Ratio, BMET, Magnesium today  If you have labs (blood work) drawn today and your tests are completely normal, you will receive your results only by: MyChart Message (if you have MyChart) OR A paper copy in the mail If you have any lab test that is abnormal or we need to change your treatment, we will call you to review the results.   Follow-Up: At [facility], you and your health needs are our priority.  As part of our continuing mission to provide you with exceptional heart care, we have created designated Provider Care Teams.  These Care Teams include your primary Cardiologist (physician) and Advanced Practice Providers (APPs -  Physician Assistants and Nurse Practitioners) who all work together to provide you with the care you need, when you need it.  We recommend signing up for the patient portal called "MyChart".  Sign up information is provided on this After Visit Summary.  MyChart is used to connect with patients for Virtual Visits (Telemedicine).  Patients are able to view lab/test results, encounter notes, upcoming appointments, etc.  Non-urgent messages can be sent to your provider as well.   To learn more about what you can do with MyChart, go to [email or web address].    Your next appointment:    6 months with Dr. Rennis Golden Other Instructions Dr. Crawford Givens

## 2023-05-02 ENCOUNTER — Other Ambulatory Visit: Payer: Self-pay | Admitting: Family Medicine

## 2023-05-03 ENCOUNTER — Encounter: Payer: Self-pay | Admitting: Family Medicine

## 2023-05-04 ENCOUNTER — Other Ambulatory Visit: Payer: Self-pay | Admitting: Family Medicine

## 2023-05-04 DIAGNOSIS — F411 Generalized anxiety disorder: Secondary | ICD-10-CM

## 2023-05-10 ENCOUNTER — Encounter (HOSPITAL_BASED_OUTPATIENT_CLINIC_OR_DEPARTMENT_OTHER): Payer: Self-pay | Admitting: Internal Medicine

## 2023-05-10 DIAGNOSIS — E876 Hypokalemia: Secondary | ICD-10-CM

## 2023-05-10 LAB — BASIC METABOLIC PANEL
BUN/Creatinine Ratio: 16 (ref 9–20)
BUN: 18 mg/dL (ref 6–20)
CO2: 28 mmol/L (ref 20–29)
Calcium: 10.2 mg/dL (ref 8.7–10.2)
Chloride: 97 mmol/L (ref 96–106)
Creatinine, Ser: 1.16 mg/dL (ref 0.76–1.27)
Glucose: 85 mg/dL (ref 70–99)
Potassium: 3.3 mmol/L — ABNORMAL LOW (ref 3.5–5.2)
Sodium: 139 mmol/L (ref 134–144)
eGFR: 83 mL/min/{1.73_m2} (ref >59)

## 2023-05-10 LAB — ALDOSTERONE + RENIN ACTIVITY W/ RATIO
Aldos/Renin Ratio: 3.3 (ref 0.0–30.0)
Aldosterone: 12.8 ng/dL (ref 0.0–30.0)
Renin Activity, Plasma: 3.843 ng/mL/h (ref 0.167–5.380)

## 2023-05-10 LAB — MAGNESIUM: Magnesium: 2.2 mg/dL (ref 1.6–2.3)

## 2023-05-13 MED ORDER — POTASSIUM CHLORIDE CRYS ER 20 MEQ PO TBCR: 20.0000 meq | EXTENDED_RELEASE_TABLET | Freq: Every day | ORAL | 5 refills | Status: DC

## 2023-05-13 NOTE — Addendum Note (Signed)
Addended by: Chrystie Nose on: 05/13/2023 11:29 AM   Modules accepted: Orders

## 2023-05-16 DIAGNOSIS — Z419 Encounter for procedure for purposes other than remedying health state, unspecified: Secondary | ICD-10-CM | POA: Diagnosis not present

## 2023-06-16 DIAGNOSIS — Z419 Encounter for procedure for purposes other than remedying health state, unspecified: Secondary | ICD-10-CM | POA: Diagnosis not present

## 2023-06-20 DIAGNOSIS — M7052 Other bursitis of knee, left knee: Secondary | ICD-10-CM | POA: Diagnosis not present

## 2023-06-28 ENCOUNTER — Ambulatory Visit: Payer: Medicaid Other | Admitting: Nurse Practitioner

## 2023-06-29 ENCOUNTER — Ambulatory Visit: Payer: Medicaid Other | Admitting: Family Medicine

## 2023-06-29 ENCOUNTER — Encounter: Payer: Self-pay | Admitting: Family Medicine

## 2023-07-03 ENCOUNTER — Ambulatory Visit (INDEPENDENT_AMBULATORY_CARE_PROVIDER_SITE_OTHER): Payer: Medicaid Other | Admitting: Physician Assistant

## 2023-07-03 ENCOUNTER — Encounter: Payer: Self-pay | Admitting: Physician Assistant

## 2023-07-03 VITALS — BP 137/100 | HR 98 | Ht 65.0 in | Wt 238.0 lb

## 2023-07-03 DIAGNOSIS — G8929 Other chronic pain: Secondary | ICD-10-CM

## 2023-07-03 DIAGNOSIS — F411 Generalized anxiety disorder: Secondary | ICD-10-CM

## 2023-07-03 DIAGNOSIS — R079 Chest pain, unspecified: Secondary | ICD-10-CM

## 2023-07-03 DIAGNOSIS — M25562 Pain in left knee: Secondary | ICD-10-CM

## 2023-07-03 DIAGNOSIS — I10 Essential (primary) hypertension: Secondary | ICD-10-CM | POA: Diagnosis not present

## 2023-07-03 DIAGNOSIS — R7303 Prediabetes: Secondary | ICD-10-CM

## 2023-07-03 MED ORDER — VALSARTAN 40 MG PO TABS: 40.0000 mg | ORAL_TABLET | Freq: Every day | ORAL | 3 refills | Status: DC

## 2023-07-03 MED ORDER — BUSPIRONE HCL 5 MG PO TABS: 5.0000 mg | ORAL_TABLET | Freq: Two times a day (BID) | ORAL | 1 refills | Status: DC

## 2023-07-03 NOTE — Progress Notes (Signed)
Established patient visit  Patient: Nathan Nelson   DOB: November 10, 1985   38 y.o. Male  MRN: [id number] Visit Date: 07/03/2023  Today's healthcare provider: Debera Lat, PA-C   Chief Complaint  Patient presents with   Medication Refill    Pt needs refill on medication , pt has no other , pt hasn't run out , pt is taking medication for his b/p everyday hasn't missed any dose.    Subjective      Discussed the use of AI scribe software for clinical note transcription with the patient, who gave verbal consent to proceed.  History of Present Illness   The patient, a 38 year old with a history of hypertension, prediabetes, and knee surgery, presents for medication refills. He reports elevated blood pressure despite daily adherence to amlodipine and chlorothiazide. He denies missing any doses and takes them around the same time each day. He also reports occasional chest pain, particularly after eating. He has recently resumed physical activity after a period of inactivity due to knee surgery. He reports experiencing pain in the knee, which has been treated with a steroid injection. He has been managing the pain with home exercises as recommended by his surgeon.  The patient also has a history of anxiety, for which he was taking bupropion daily. However, he stopped taking the medication because he didn't feel right and felt it was not helping his anxiety. He has been off alprazolam, a controlled substance, for some time and expresses a desire not to return to it. He admits to not eating right recently but is making efforts to improve his diet.           07/03/2023    4:16 PM 03/02/2023   12:11 PM 08/08/2022    3:46 PM  Depression screen PHQ 2/9  Decreased Interest 2 1 0  Down, Depressed, Hopeless 1 2 1   PHQ - 2 Score 3 3 1   Altered sleeping 3 3 1   Tired, decreased energy 2 1 1   Change in appetite 2 2 1   Feeling bad or failure about yourself  2 0 0  Trouble concentrating 1 0 1  Moving  slowly or fidgety/restless 0 0 0  Suicidal thoughts 0 0 0  PHQ-9 Score 13 9 5   Difficult doing work/chores Somewhat difficult Not difficult at all Not difficult at all      07/03/2023    4:17 PM 03/02/2023   12:11 PM 04/19/2022    1:36 PM 06/10/2021    3:46 PM  GAD 7 : Generalized Anxiety Score  Nervous, Anxious, on Edge 2 1 0 1  Control/stop worrying 2 1 3  0  Worry too much - different things 2 1 3  0  Trouble relaxing 1 2 3 1   Restless 0 0 0 0  Easily annoyed or irritable 1 1 1 1   Afraid - awful might happen 1  1 1   Total GAD 7 Score 9  11 4   Anxiety Difficulty Somewhat difficult Not difficult at all Not difficult at all Somewhat difficult    Medications: Outpatient Medications Prior to Visit  Medication Sig   ALPRAZolam (XANAX) 0.25 MG tablet Take 1 tablet (0.25 mg total) by mouth daily as needed for anxiety.   amLODipine (NORVASC) 10 MG tablet Take 1 tablet (10 mg total) by mouth daily.   buPROPion (WELLBUTRIN XL) 300 MG 24 hr tablet Take 1 tablet (300 mg total) by mouth daily.   chlorthalidone (HYGROTON) 50 MG tablet Take 50 mg by mouth daily.  ondansetron (ZOFRAN-ODT) 4 MG disintegrating tablet Take 1 tablet (4 mg total) by mouth every 8 (eight) hours as needed.   oxyCODONE-acetaminophen (PERCOCET) 5-325 MG tablet Take 1-2 tablets by mouth every 8 (eight) hours as needed for severe pain (pain score 7-10).   potassium chloride SA (KLOR-CON M) 20 MEQ tablet Take 1 tablet (20 mEq total) by mouth daily.   ketorolac (TORADOL) 10 MG tablet Take 1 tablet (10 mg total) by mouth every 6 (six) hours as needed.   methocarbamol (ROBAXIN) 500 MG tablet Take 1-2 tablets (500-1,000 mg total) by mouth every 6 (six) hours as needed. (Patient not taking: Reported on 03/02/2023)   No facility-administered medications prior to visit.    Review of Systems All negative Except see HPI       Objective    BP (!) 137/100 Comment: right  Pulse 98   Ht 5\' 5"  (1.651 m)   Wt 238 lb (108 kg)    SpO2 97%   BMI 39.61 kg/m     Physical Exam Vitals reviewed.  Constitutional:      General: He is not in acute distress.    Appearance: Normal appearance. He is not diaphoretic.  HENT:     Head: Normocephalic and atraumatic.  Eyes:     General: No scleral icterus.    Conjunctiva/sclera: Conjunctivae normal.  Cardiovascular:     Rate and Rhythm: Normal rate and regular rhythm.     Pulses: Normal pulses.     Heart sounds: Normal heart sounds. No murmur heard. Pulmonary:     Effort: Pulmonary effort is normal. No respiratory distress.     Breath sounds: Normal breath sounds. No wheezing or rhonchi.  Musculoskeletal:     Cervical back: Neck supple.     Right lower leg: No edema.     Left lower leg: No edema.  Lymphadenopathy:     Cervical: No cervical adenopathy.  Skin:    General: Skin is warm and dry.     Findings: No rash.  Neurological:     Mental Status: He is alert and oriented to person, place, and time. Mental status is at baseline.  Psychiatric:        Mood and Affect: Mood normal.        Behavior: Behavior normal.      No results found for any visits on 07/03/23.      Assessment and Plan    Hypertension Elevated blood pressure readings in the office despite reported adherence to Amlodipine and Chlorthalidone. Patient reports lower readings at home. Possible contribution from poor diet and lack of exercise. -Continue Amlodipine and Chlorthalidone. -Add a new antihypertensive medication. -Check blood pressure twice daily at home and bring records and device to next appointment. -Adopt a low-salt and DASH diet. -Repeat labs including lipid panel and A1c.  Anxiety Patient reports anxiety, particularly when about to go somewhere or do something. -Discontinue Alprazolam due to risk of addiction and cognitive decline. -Start Buspar at a low dose and titrate as needed. -Return in two weeks for follow-up.  Post-operative knee pain Patient reports pain  following knee surgery in October. Currently managing pain at home without formal physical therapy. -Refer to physical therapy for a consultation and home exercise regimen. -Continue home exercises as tolerated.  Chest discomfort Patient reports occasional chest discomfort, particularly after eating certain foods. Could be due to possible acid reflux? Previously, was evaluated at ED as costochondritis. -Advise to monitor diet and avoid lying down soon after eating. -Consider  further evaluation if symptoms persist.  Prediabetes Last A1c indicated prediabetes. -Check A1c with next labs. -Refer to American Diabetic Association website for dietary recommendations.      Orders Placed This Encounter  Procedures   Lipid panel    Has the patient fasted?:   Yes   Basic Metabolic Panel (BMET)    Has the patient fasted?:   Yes   HgB A1c   Ambulatory referral to Physical Therapy    Referral Priority:   Routine    Referral Type:   Physical Medicine    Referral Reason:   Specialty Services Required    Requested Specialty:   Physical Therapy    Number of Visits Requested:   1    Return in about 2 weeks (around 07/17/2023) for BP f/u.   The patient was advised to call back or seek an in-person evaluation if the symptoms worsen or if the condition fails to improve as anticipated.  I discussed the assessment and treatment plan with the patient. The patient was provided an opportunity to ask questions and all were answered. The patient agreed with the plan and demonstrated an understanding of the instructions.  I, Debera Lat, PA-C have reviewed all documentation for this visit. The documentation on 07/03/2023  for the exam, diagnosis, procedures, and orders are all accurate and complete.  Debera Lat, [facility] MMS [facility] [phone] (phone) [phone] (fax)  [facility]

## 2023-07-04 ENCOUNTER — Encounter: Payer: Self-pay | Admitting: Physician Assistant

## 2023-07-14 DIAGNOSIS — Z419 Encounter for procedure for purposes other than remedying health state, unspecified: Secondary | ICD-10-CM | POA: Diagnosis not present

## 2023-07-16 NOTE — Progress Notes (Deleted)
 Established patient visit  Patient: Nathan Nelson   DOB: 26-Apr-1986   38 y.o. Male  MRN: [id number] Visit Date: 07/17/2023  Today's healthcare provider: Debera Lat, PA-C   No chief complaint on file.  Subjective       Discussed the use of AI scribe software for clinical note transcription with the patient, who gave verbal consent to proceed.  History of Present Illness               07/03/2023    4:16 PM 03/02/2023   12:11 PM 08/08/2022    3:46 PM  Depression screen PHQ 2/9  Decreased Interest 2 1 0  Down, Depressed, Hopeless 1 2 1   PHQ - 2 Score 3 3 1   Altered sleeping 3 3 1   Tired, decreased energy 2 1 1   Change in appetite 2 2 1   Feeling bad or failure about yourself  2 0 0  Trouble concentrating 1 0 1  Moving slowly or fidgety/restless 0 0 0  Suicidal thoughts 0 0 0  PHQ-9 Score 13 9 5   Difficult doing work/chores Somewhat difficult Not difficult at all Not difficult at all      07/03/2023    4:17 PM 03/02/2023   12:11 PM 04/19/2022    1:36 PM 06/10/2021    3:46 PM  GAD 7 : Generalized Anxiety Score  Nervous, Anxious, on Edge 2 1 0 1  Control/stop worrying 2 1 3  0  Worry too much - different things 2 1 3  0  Trouble relaxing 1 2 3 1   Restless 0 0 0 0  Easily annoyed or irritable 1 1 1 1   Afraid - awful might happen 1  1 1   Total GAD 7 Score 9  11 4   Anxiety Difficulty Somewhat difficult Not difficult at all Not difficult at all Somewhat difficult    Medications: Outpatient Medications Prior to Visit  Medication Sig  . ALPRAZolam (XANAX) 0.25 MG tablet Take 1 tablet (0.25 mg total) by mouth daily as needed for anxiety.  Marland Kitchen amLODipine (NORVASC) 10 MG tablet Take 1 tablet (10 mg total) by mouth daily.  Marland Kitchen buPROPion (WELLBUTRIN XL) 300 MG 24 hr tablet Take 1 tablet (300 mg total) by mouth daily.  . busPIRone (BUSPAR) 5 MG tablet Take 1 tablet (5 mg total) by mouth 2 (two) times daily.  . chlorthalidone (HYGROTON) 50 MG tablet Take 50 mg by mouth daily.  .  ondansetron (ZOFRAN-ODT) 4 MG disintegrating tablet Take 1 tablet (4 mg total) by mouth every 8 (eight) hours as needed.  Marland Kitchen oxyCODONE-acetaminophen (PERCOCET) 5-325 MG tablet Take 1-2 tablets by mouth every 8 (eight) hours as needed for severe pain (pain score 7-10).  . potassium chloride SA (KLOR-CON M) 20 MEQ tablet Take 1 tablet (20 mEq total) by mouth daily.  . valsartan (DIOVAN) 40 MG tablet Take 1 tablet (40 mg total) by mouth daily.   No facility-administered medications prior to visit.    Review of Systems  All other systems reviewed and are negative. All negative Except see HPI   {Insert previous labs (optional):23779} {See past labs  Heme  Chem  Endocrine  Serology  Results Review (optional):1}   Objective    There were no vitals taken for this visit. {Insert last BP/Wt (optional):23777}{See vitals history (optional):1}   Physical Exam Vitals reviewed.  Constitutional:      General: He is not in acute distress.    Appearance: Normal appearance. He is not diaphoretic.  HENT:  Head: Normocephalic and atraumatic.  Eyes:     General: No scleral icterus.    Conjunctiva/sclera: Conjunctivae normal.  Cardiovascular:     Rate and Rhythm: Normal rate and regular rhythm.     Pulses: Normal pulses.     Heart sounds: Normal heart sounds. No murmur heard. Pulmonary:     Effort: Pulmonary effort is normal. No respiratory distress.     Breath sounds: Normal breath sounds. No wheezing or rhonchi.  Musculoskeletal:     Cervical back: Neck supple.     Right lower leg: No edema.     Left lower leg: No edema.  Lymphadenopathy:     Cervical: No cervical adenopathy.  Skin:    General: Skin is warm and dry.     Findings: No rash.  Neurological:     Mental Status: He is alert and oriented to person, place, and time. Mental status is at baseline.  Psychiatric:        Mood and Affect: Mood normal.        Behavior: Behavior normal.     No results found for any visits on  07/17/23.      Assessment and Plan             No orders of the defined types were placed in this encounter.   No follow-ups on file.   The patient was advised to call back or seek an in-person evaluation if the symptoms worsen or if the condition fails to improve as anticipated.  I discussed the assessment and treatment plan with the patient. The patient was provided an opportunity to ask questions and all were answered. The patient agreed with the plan and demonstrated an understanding of the instructions.  I, Debera Lat, PA-C have reviewed all documentation for this visit. The documentation on 07/17/2023  for the exam, diagnosis, procedures, and orders are all accurate and complete.  Debera Lat, [facility], MMS [facility] [phone] (phone) [phone] (fax)  [facility]

## 2023-07-17 ENCOUNTER — Ambulatory Visit: Payer: Medicaid Other | Admitting: Physician Assistant

## 2023-07-17 DIAGNOSIS — F411 Generalized anxiety disorder: Secondary | ICD-10-CM

## 2023-07-17 DIAGNOSIS — K219 Gastro-esophageal reflux disease without esophagitis: Secondary | ICD-10-CM

## 2023-07-17 DIAGNOSIS — I10 Essential (primary) hypertension: Secondary | ICD-10-CM

## 2023-07-17 DIAGNOSIS — G8929 Other chronic pain: Secondary | ICD-10-CM

## 2023-07-17 DIAGNOSIS — R7303 Prediabetes: Secondary | ICD-10-CM

## 2023-07-18 LAB — BASIC METABOLIC PANEL
BUN/Creatinine Ratio: 10 (ref 9–20)
BUN: 14 mg/dL (ref 6–20)
CO2: 28 mmol/L (ref 20–29)
Calcium: 10.2 mg/dL (ref 8.7–10.2)
Chloride: 95 mmol/L — ABNORMAL LOW (ref 96–106)
Creatinine, Ser: 1.4 mg/dL — ABNORMAL HIGH (ref 0.76–1.27)
Glucose: 85 mg/dL (ref 70–99)
Potassium: 3.8 mmol/L (ref 3.5–5.2)
Sodium: 140 mmol/L (ref 134–144)
eGFR: 66 mL/min/{1.73_m2} (ref >59)

## 2023-07-18 LAB — LIPID PANEL
Chol/HDL Ratio: 5.7 ratio — ABNORMAL HIGH (ref 0.0–5.0)
Cholesterol, Total: 228 mg/dL — ABNORMAL HIGH (ref 100–199)
HDL: 40 mg/dL (ref >39)
LDL Chol Calc (NIH): 174 mg/dL — ABNORMAL HIGH (ref 0–99)
Triglycerides: 81 mg/dL (ref 0–149)
VLDL Cholesterol Cal: 14 mg/dL (ref 5–40)

## 2023-07-18 LAB — HEMOGLOBIN A1C
Est. average glucose Bld gHb Est-mCnc: 123 mg/dL
Hgb A1c MFr Bld: 5.9 % — ABNORMAL HIGH (ref 4.8–5.6)

## 2023-07-19 ENCOUNTER — Encounter: Payer: Self-pay | Admitting: Physician Assistant

## 2023-07-25 DIAGNOSIS — R079 Chest pain, unspecified: Secondary | ICD-10-CM | POA: Diagnosis not present

## 2023-07-25 DIAGNOSIS — Z7951 Long term (current) use of inhaled steroids: Secondary | ICD-10-CM | POA: Diagnosis not present

## 2023-07-25 DIAGNOSIS — R0789 Other chest pain: Secondary | ICD-10-CM | POA: Diagnosis not present

## 2023-07-25 DIAGNOSIS — F419 Anxiety disorder, unspecified: Secondary | ICD-10-CM | POA: Diagnosis not present

## 2023-07-25 DIAGNOSIS — Z87891 Personal history of nicotine dependence: Secondary | ICD-10-CM | POA: Diagnosis not present

## 2023-07-25 DIAGNOSIS — I10 Essential (primary) hypertension: Secondary | ICD-10-CM | POA: Diagnosis not present

## 2023-07-25 DIAGNOSIS — Z888 Allergy status to other drugs, medicaments and biological substances status: Secondary | ICD-10-CM | POA: Diagnosis not present

## 2023-07-25 DIAGNOSIS — Z79899 Other long term (current) drug therapy: Secondary | ICD-10-CM | POA: Diagnosis not present

## 2023-07-26 DIAGNOSIS — R079 Chest pain, unspecified: Secondary | ICD-10-CM | POA: Diagnosis not present

## 2023-07-29 NOTE — Progress Notes (Unsigned)
 Established patient visit  Patient: Nathan Nelson   DOB: 1985/07/06   38 y.o. Male  MRN: [id number] Visit Date: 07/31/2023  Today's healthcare provider: Debera Lat, PA-C   No chief complaint on file.  Subjective       Discussed the use of AI scribe software for clinical note transcription with the patient, who gave verbal consent to proceed.  History of Present Illness               07/03/2023    4:16 PM 03/02/2023   12:11 PM 08/08/2022    3:46 PM  Depression screen PHQ 2/9  Decreased Interest 2 1 0  Down, Depressed, Hopeless 1 2 1   PHQ - 2 Score 3 3 1   Altered sleeping 3 3 1   Tired, decreased energy 2 1 1   Change in appetite 2 2 1   Feeling bad or failure about yourself  2 0 0  Trouble concentrating 1 0 1  Moving slowly or fidgety/restless 0 0 0  Suicidal thoughts 0 0 0  PHQ-9 Score 13 9 5   Difficult doing work/chores Somewhat difficult Not difficult at all Not difficult at all      07/03/2023    4:17 PM 03/02/2023   12:11 PM 04/19/2022    1:36 PM 06/10/2021    3:46 PM  GAD 7 : Generalized Anxiety Score  Nervous, Anxious, on Edge 2 1 0 1  Control/stop worrying 2 1 3  0  Worry too much - different things 2 1 3  0  Trouble relaxing 1 2 3 1   Restless 0 0 0 0  Easily annoyed or irritable 1 1 1 1   Afraid - awful might happen 1  1 1   Total GAD 7 Score 9  11 4   Anxiety Difficulty Somewhat difficult Not difficult at all Not difficult at all Somewhat difficult    Medications: Outpatient Medications Prior to Visit  Medication Sig  . ALPRAZolam (XANAX) 0.25 MG tablet Take 1 tablet (0.25 mg total) by mouth daily as needed for anxiety.  Marland Kitchen amLODipine (NORVASC) 10 MG tablet Take 1 tablet (10 mg total) by mouth daily.  Marland Kitchen buPROPion (WELLBUTRIN XL) 300 MG 24 hr tablet Take 1 tablet (300 mg total) by mouth daily.  . busPIRone (BUSPAR) 5 MG tablet Take 1 tablet (5 mg total) by mouth 2 (two) times daily.  . chlorthalidone (HYGROTON) 50 MG tablet Take 50 mg by mouth daily.  .  ondansetron (ZOFRAN-ODT) 4 MG disintegrating tablet Take 1 tablet (4 mg total) by mouth every 8 (eight) hours as needed.  Marland Kitchen oxyCODONE-acetaminophen (PERCOCET) 5-325 MG tablet Take 1-2 tablets by mouth every 8 (eight) hours as needed for severe pain (pain score 7-10).  . potassium chloride SA (KLOR-CON M) 20 MEQ tablet Take 1 tablet (20 mEq total) by mouth daily.  . valsartan (DIOVAN) 40 MG tablet Take 1 tablet (40 mg total) by mouth daily.   No facility-administered medications prior to visit.    Review of Systems  All other systems reviewed and are negative. All negative Except see HPI   {Insert previous labs (optional):23779} {See past labs  Heme  Chem  Endocrine  Serology  Results Review (optional):1}   Objective    There were no vitals taken for this visit. {Insert last BP/Wt (optional):23777}{See vitals history (optional):1}   Physical Exam Vitals reviewed.  Constitutional:      General: He is not in acute distress.    Appearance: Normal appearance. He is not diaphoretic.  HENT:  Head: Normocephalic and atraumatic.  Eyes:     General: No scleral icterus.    Conjunctiva/sclera: Conjunctivae normal.  Cardiovascular:     Rate and Rhythm: Normal rate and regular rhythm.     Pulses: Normal pulses.     Heart sounds: Normal heart sounds. No murmur heard. Pulmonary:     Effort: Pulmonary effort is normal. No respiratory distress.     Breath sounds: Normal breath sounds. No wheezing or rhonchi.  Musculoskeletal:     Cervical back: Neck supple.     Right lower leg: No edema.     Left lower leg: No edema.  Lymphadenopathy:     Cervical: No cervical adenopathy.  Skin:    General: Skin is warm and dry.     Findings: No rash.  Neurological:     Mental Status: He is alert and oriented to person, place, and time. Mental status is at baseline.  Psychiatric:        Mood and Affect: Mood normal.        Behavior: Behavior normal.     No results found for any visits on  07/31/23.      Assessment and Plan             No orders of the defined types were placed in this encounter.   No follow-ups on file.   The patient was advised to call back or seek an in-person evaluation if the symptoms worsen or if the condition fails to improve as anticipated.  I discussed the assessment and treatment plan with the patient. The patient was provided an opportunity to ask questions and all were answered. The patient agreed with the plan and demonstrated an understanding of the instructions.  I, Debera Lat, PA-C have reviewed all documentation for this visit. The documentation on 07/31/2023  for the exam, diagnosis, procedures, and orders are all accurate and complete.  Debera Lat, [facility], MMS [facility] [phone] (phone) [phone] (fax)  [facility]

## 2023-07-31 ENCOUNTER — Ambulatory Visit (INDEPENDENT_AMBULATORY_CARE_PROVIDER_SITE_OTHER): Admitting: Physician Assistant

## 2023-07-31 VITALS — BP 128/88 | HR 97 | Temp 98.1°F | Resp 16 | Ht 62.0 in | Wt 234.6 lb

## 2023-07-31 DIAGNOSIS — I10 Essential (primary) hypertension: Secondary | ICD-10-CM | POA: Diagnosis not present

## 2023-07-31 DIAGNOSIS — M25569 Pain in unspecified knee: Secondary | ICD-10-CM

## 2023-07-31 DIAGNOSIS — F411 Generalized anxiety disorder: Secondary | ICD-10-CM

## 2023-07-31 DIAGNOSIS — E559 Vitamin D deficiency, unspecified: Secondary | ICD-10-CM

## 2023-07-31 DIAGNOSIS — R7303 Prediabetes: Secondary | ICD-10-CM

## 2023-08-01 ENCOUNTER — Encounter: Payer: Self-pay | Admitting: Physician Assistant

## 2023-08-07 ENCOUNTER — Ambulatory Visit: Payer: Medicaid Other | Admitting: General Practice

## 2023-08-08 ENCOUNTER — Encounter: Payer: Medicaid Other | Admitting: General Practice

## 2023-08-13 ENCOUNTER — Other Ambulatory Visit: Payer: Self-pay

## 2023-08-13 ENCOUNTER — Other Ambulatory Visit: Payer: Self-pay | Admitting: Physician Assistant

## 2023-08-13 ENCOUNTER — Encounter: Payer: Self-pay | Admitting: Physician Assistant

## 2023-08-13 MED ORDER — CHLORTHALIDONE 50 MG PO TABS
50.0000 mg | ORAL_TABLET | Freq: Every day | ORAL | 3 refills | Status: DC
  Filled 2023-08-13: qty 90, 90d supply, fill #0

## 2023-08-13 NOTE — Telephone Encounter (Signed)
 Copied from CRM [id number]. Topic: Clinical - Medication Refill >> Aug 13, 2023 11:22 AM Geroge Baseman wrote: Most Recent Primary Care Visit:  Provider: Debera Lat  Department: [facility]  Visit Type: OFFICE VISIT  Date: 07/31/2023  Medication: amLODipine (NORVASC) 10 MG, chlorthalidone (HYGROTON) 50 MG   Has the patient contacted their pharmacy? Yes (Agent: If no, request that the patient contact the pharmacy for the refill. If patient does not wish to contact the pharmacy document the reason why and proceed with request.) (Agent: If yes, when and what did the pharmacy advise?)  Is this the correct pharmacy for this prescription? Yes If no, delete pharmacy and type the correct one.  This is the patient's preferred pharmacy:  [facility] [address] [place] - [address] [place] [address] Phone: [phone] Fax: [phone]    Has the prescription been filled recently? No  Is the patient out of the medication? Yes  Has the patient been seen for an appointment in the last year OR does the patient have an upcoming appointment? Yes  Can we respond through MyChart? No  Agent: Please be advised that Rx refills may take up to 3 business days. We ask that you follow-up with your pharmacy.

## 2023-08-13 NOTE — Telephone Encounter (Unsigned)
 Copied from CRM [id number]. Topic: Clinical - Medication Refill >> Aug 13, 2023 11:22 AM Geroge Baseman wrote: Most Recent Primary Care Visit:  Provider: Debera Lat  Department: [facility]  Visit Type: OFFICE VISIT  Date: 07/31/2023  Medication: amLODipine (NORVASC) 10 MG, chlorthalidone (HYGROTON) 50 MG   Has the patient contacted their pharmacy? Yes (Agent: If no, request that the patient contact the pharmacy for the refill. If patient does not wish to contact the pharmacy document the reason why and proceed with request.) (Agent: If yes, when and what did the pharmacy advise?)  Is this the correct pharmacy for this prescription? Yes If no, delete pharmacy and type the correct one.  This is the patient's preferred pharmacy:  [facility] [address] [place] - [address] [place] [address] Phone: [phone] Fax: [phone]    Has the prescription been filled recently? No  Is the patient out of the medication? Yes  Has the patient been seen for an appointment in the last year OR does the patient have an upcoming appointment? Yes  Can we respond through MyChart? No  Agent: Please be advised that Rx refills may take up to 3 business days. We ask that you follow-up with your pharmacy.

## 2023-08-13 NOTE — Telephone Encounter (Signed)
 Copied from CRM [id number]. Topic: Clinical - Prescription Issue >> Aug 13, 2023  4:56 PM Priscille Loveless wrote: Reason for CRM:  chlorthalidone (HYGROTON) 50 MG tablet This was sent to [facility] and it needs to go to [facility] listed below:   [facility] [address] [place] - [address] [place] [address] Phone: [phone]  Fax: [phone]

## 2023-08-14 MED ORDER — AMLODIPINE BESYLATE 10 MG PO TABS
10.0000 mg | ORAL_TABLET | Freq: Every day | ORAL | 3 refills | Status: DC
Start: 2023-08-14 — End: 2023-08-20

## 2023-08-15 NOTE — Telephone Encounter (Signed)
 Duplicate requests, last refilled 08/13/23.  Requested Prescriptions  Pending Prescriptions Disp Refills   amLODipine (NORVASC) 10 MG tablet 90 tablet 3    Sig: Take 1 tablet (10 mg total) by mouth daily.     Cardiovascular: Calcium Channel Blockers 2 Passed - 08/15/2023 11:13 AM      Passed - Last BP in normal range    BP Readings from Last 1 Encounters:  07/31/23 128/88         Passed - Last Heart Rate in normal range    Pulse Readings from Last 1 Encounters:  07/31/23 97         Passed - Valid encounter within last 6 months    Recent Outpatient Visits           2 weeks ago Primary hypertension   [facility] [place], [place], PA-C   1 month ago Primary hypertension   [facility] [place], [place], PA-C       Future Appointments             In 2 months [place], [place], PA-C [facility]             chlorthalidone (HYGROTON) 50 MG tablet      Sig: Take 1 tablet (50 mg total) by mouth daily.     Cardiovascular: Diuretics - Thiazide Failed - 08/15/2023 11:13 AM      Failed - Cr in normal range and within 180 days    Creatinine, Ser  Date Value Ref Range Status  07/17/2023 1.40 (H) 0.76 - 1.27 mg/dL Final   Creatinine,U  Date Value Ref Range Status  09/14/2009  mg/dL Final   528.4 (NOTE)  Cutoff Values for Urine Drug Screen:        Drug Class           Cutoff (ng/mL)        Amphetamines            1000        Barbiturates             200        Cocaine Metabolites      300        Benzodiazepines          200        Methadone                 300        Opiates                 2000        Phencyclidine             25        Propoxyphene             300        Marijuana Metabolites     50  For medical purposes only.         Passed - K in normal range and within 180 days    Potassium  Date Value Ref Range Status  07/17/2023 3.8 3.5 - 5.2 mmol/L Final         Passed - Na in normal range  and within 180 days    Sodium  Date Value Ref Range Status  07/17/2023 140 134 - 144 mmol/L Final         Passed - Last BP in normal range  BP Readings from Last 1 Encounters:  07/31/23 128/88         Passed - Valid encounter within last 6 months    Recent Outpatient Visits           2 weeks ago Primary hypertension   [place] [facility] [place], [place], PA-C   1 month ago Primary hypertension   [place] [facility] [place], [place], PA-C       Future Appointments             In 2 months Ostwalt, [place], PA-C [place] [facility]             chlorthalidone (HYGROTON) 50 MG tablet 90 tablet 3    Sig: Take 1 tablet (50 mg total) by mouth daily.     Cardiovascular: Diuretics - Thiazide Failed - 08/15/2023 11:13 AM      Failed - Cr in normal range and within 180 days    Creatinine, Ser  Date Value Ref Range Status  07/17/2023 1.40 (H) 0.76 - 1.27 mg/dL Final   Creatinine,U  Date Value Ref Range Status  09/14/2009  mg/dL Final   440.1 (NOTE)  Cutoff Values for Urine Drug Screen:        Drug Class           Cutoff (ng/mL)        Amphetamines            1000        Barbiturates             200        Cocaine Metabolites      300        Benzodiazepines          200        Methadone                 300        Opiates                 2000        Phencyclidine             25        Propoxyphene             300        Marijuana Metabolites     50  For medical purposes only.         Passed - K in normal range and within 180 days    Potassium  Date Value Ref Range Status  07/17/2023 3.8 3.5 - 5.2 mmol/L Final         Passed - Na in normal range and within 180 days    Sodium  Date Value Ref Range Status  07/17/2023 140 134 - 144 mmol/L Final         Passed - Last BP in normal range    BP Readings from Last 1 Encounters:  07/31/23 128/88         Passed - Valid encounter within last 6 months    Recent  Outpatient Visits           2 weeks ago Primary hypertension   [facility] [place], [place], PA-C   1 month ago Primary hypertension   [place] [facility] [place], [place], [place]       Future Appointments  In 2 months Ostwalt, Edmon [place], PA-C [facility] [place]

## 2023-08-16 ENCOUNTER — Telehealth: Payer: Self-pay | Admitting: Physician Assistant

## 2023-08-16 ENCOUNTER — Other Ambulatory Visit: Payer: Self-pay

## 2023-08-16 ENCOUNTER — Telehealth: Payer: Self-pay

## 2023-08-16 DIAGNOSIS — I10 Essential (primary) hypertension: Secondary | ICD-10-CM

## 2023-08-16 MED ORDER — CHLORTHALIDONE 50 MG PO TABS
50.0000 mg | ORAL_TABLET | Freq: Every day | ORAL | 3 refills | Status: DC
Start: 2023-08-16 — End: 2023-08-17

## 2023-08-16 NOTE — Telephone Encounter (Signed)
 Patient called and is out of medication .Marland KitchenMedication was sent to wrong Pharmacy [facility]... [facility] [address]is the correct Pharmacy    chlorthalidone (HYGROTON) 50 MG tablet    Please advise

## 2023-08-16 NOTE — Telephone Encounter (Signed)
 Copied from CRM [id number]. Topic: Clinical - Prescription Issue >> Aug 16, 2023  2:37 PM Nyra Capes wrote: Reason for CRM: patient calling in asking why the medication  chlorthalidone (HYGROTON) 50 MG tablet has not been sent to the  Patient is out of medication  [facility] [address] [place], [place] - [address] [place] [address] Phone: [phone] Fax: [phone] Patient spoke to pharmacy this morning and they do not have it. Wife is at the pharmacy now wanting to pick it up.

## 2023-08-17 MED ORDER — CHLORTHALIDONE 50 MG PO TABS: 50.0000 mg | ORAL_TABLET | Freq: Every day | ORAL | 3 refills | Status: AC

## 2023-08-17 NOTE — Addendum Note (Signed)
 Addended by: Debera Lat on: 08/17/2023 11:04 AM   Modules accepted: Orders

## 2023-08-20 ENCOUNTER — Telehealth: Payer: Self-pay | Admitting: Physician Assistant

## 2023-08-20 MED ORDER — AMLODIPINE BESYLATE 10 MG PO TABS: 10.0000 mg | ORAL_TABLET | Freq: Every day | ORAL | 3 refills | Status: AC

## 2023-08-20 NOTE — Telephone Encounter (Signed)
 [facility] faxed refill request for the following medications:   amLODipine (NORVASC) 10 MG tablet    Please advise

## 2023-08-25 DIAGNOSIS — Z419 Encounter for procedure for purposes other than remedying health state, unspecified: Secondary | ICD-10-CM | POA: Diagnosis not present

## 2023-09-22 ENCOUNTER — Encounter: Payer: Self-pay | Admitting: Physician Assistant

## 2023-09-22 ENCOUNTER — Emergency Department: Admission: EM | Admit: 2023-09-22 | Discharge: 2023-09-22 | Disposition: A | Discharge status: Home / Self Care

## 2023-09-22 DIAGNOSIS — R000 Tachycardia, unspecified: Secondary | ICD-10-CM | POA: Diagnosis not present

## 2023-09-22 DIAGNOSIS — I10 Essential (primary) hypertension: Secondary | ICD-10-CM | POA: Insufficient documentation

## 2023-09-22 DIAGNOSIS — F419 Anxiety disorder, unspecified: Secondary | ICD-10-CM | POA: Diagnosis not present

## 2023-09-22 DIAGNOSIS — F410 Panic disorder [episodic paroxysmal anxiety] without agoraphobia: Secondary | ICD-10-CM | POA: Insufficient documentation

## 2023-09-22 MED ORDER — LORAZEPAM 1 MG PO TABS
0.5000 mg | ORAL_TABLET | Freq: Once | ORAL | Status: AC
  Administered 2023-09-22: 0.5 mg via ORAL
  Filled 2023-09-22: qty 1

## 2023-09-22 MED ORDER — HYDROXYZINE HCL 10 MG PO TABS: 10.0000 mg | ORAL_TABLET | Freq: Three times a day (TID) | ORAL | 0 refills | Status: DC | PRN

## 2023-09-22 NOTE — Discharge Instructions (Addendum)
 Your evaluation in the emergency department was overall reassuring.  Please do restart your bupropion  and buspirone  as already prescribed.  I have also prescribed you a separate medication called hydroxyzine  to use as needed for any panic attacks.  You can discuss with your primary provider whether you should restart Xanax .  Return to the emergency department with any new or worsening symptoms.

## 2023-09-22 NOTE — ED Provider Notes (Signed)
 [facility] Provider Note    Event Date/Time   First MD Initiated Contact with Patient 09/22/23 1447     (approximate)   History   Anxiety  Patient states he is here for anxiety and a panic attack; has taken Xanax  and Wellbutrin  in the past but he stopped taking them about a month ago.    HPI Nathan Nelson is a 38 y.o. male past medical history generalized anxiety disorder, hyperlipidemia, chronic prescription benzodiazepine use, hypertension presents for evaluation of reported anxiety - Feels he is having panic attacks, impending sense of doom.  Not been doing well with his anxiety but drank alcohol last night has been feeling very anxious this morning. - Denies SI, HI, hallucinations.  Wife drove him to the hospital.  Took one of his BuSpar  tablets prior to arrival.  Has not been taking BuSpar  and Wellbutrin  for the past couple months because he has been because he has been doing well this is anxiety. - No chest pain, shortness of breath, abdominal pain  Per chart review, last seen in clinic on 07/31/2023.  Commented on anxiety, noted not to be taking his BuSpar .  Encouraged exercise and lifestyle modifications.     Physical Exam   Triage Vital Signs: ED Triage Vitals  Encounter Vitals Group     BP 09/22/23 1441 (!) 174/106     Systolic BP Percentile --      Diastolic BP Percentile --      Pulse Rate 09/22/23 1442 (!) 110     Resp 09/22/23 1441 18     Temp 09/22/23 1441 (!) 97.5 F (36.4 C)     Temp Source 09/22/23 1441 Oral     SpO2 09/22/23 1441 100 %     Weight --      Height 09/22/23 1441 5\' 4"  (1.626 m)     Head Circumference --      Peak Flow --      Pain Score --      Pain Loc --      Pain Education --      Exclude from Growth Chart --     Most recent vital signs: Vitals:   09/22/23 1441 09/22/23 1442  BP: (!) 174/106   Pulse:  (!) 110  Resp: 18   Temp: (!) 97.5 F (36.4 C)   SpO2: 100%    General: Awake, no acute distress  but does appear somewhat anxious CV:  Good peripheral perfusion.  Mild tachycardia, regular rhythm, RP 2+ Resp:  Normal effort. CTAB Psych:  Denies SI, HI, hallucinations. +anxiety   ED Results / Procedures / Treatments   Labs (all labs ordered are listed, but only abnormal results are displayed) Labs Reviewed - No data to display   EKG  Ecg = sinus tach, rate 103, no ST elevation or depression, no significant repolarization abnormality, normal axis, normal intervals.  No evidence of ischemia nor arrhythmia on my read.   RADIOLOGY N/a   PROCEDURES:  Critical Care performed: No  Procedures   MEDICATIONS ORDERED IN ED: Medications  LORazepam  (ATIVAN ) tablet 0.5 mg (0.5 mg Oral Given 09/22/23 1515)     IMPRESSION / MDM / ASSESSMENT AND PLAN / ED COURSE  I reviewed the triage vital signs and the nursing notes.                              DDX/MDM/AP: Differential diagnosis includes, but is  not limited to, panic attack, doubt underlying organic etiology though will screen with EKG given mild tachycardia here-open highly doubt ACS.  Will give small dose of Ativan  and counseled patient to resume his usual bupropion  and buspirone  with plan for PMD follow-up.  ED return precautions in place.  Patient agrees with plan.  Plan: - Oral Ativan  - EKG - Encouraged to restart antianxiety medications and follow-up with PMD --confirmed he already has these at home - Rx hydroxyzine  for any breakthrough anxiety  Patient's presentation is most consistent with exacerbation of chronic illness.       FINAL CLINICAL IMPRESSION(S) / ED DIAGNOSES   Final diagnoses:  Panic attack  Anxiety     Rx / DC Orders   ED Discharge Orders          Ordered    hydrOXYzine  (ATARAX ) 10 MG tablet  3 times daily PRN        09/22/23 1515             Note:  This document was prepared using Dragon voice recognition software and may include unintentional dictation errors.   Collis Deaner, MD 09/22/23 [phone]

## 2023-09-22 NOTE — ED Triage Notes (Signed)
 Patient states he is here for anxiety and a panic attack; has taken Xanax  and Wellbutrin  in the past but he stopped taking them about a month ago.

## 2023-09-24 DIAGNOSIS — Z419 Encounter for procedure for purposes other than remedying health state, unspecified: Secondary | ICD-10-CM | POA: Diagnosis not present

## 2023-10-18 ENCOUNTER — Ambulatory Visit: Attending: Orthopedic Surgery

## 2023-10-18 DIAGNOSIS — M79605 Pain in left leg: Secondary | ICD-10-CM | POA: Diagnosis not present

## 2023-10-18 DIAGNOSIS — R262 Difficulty in walking, not elsewhere classified: Secondary | ICD-10-CM | POA: Diagnosis not present

## 2023-10-18 DIAGNOSIS — M6281 Muscle weakness (generalized): Secondary | ICD-10-CM | POA: Insufficient documentation
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
## 2023-10-23 ENCOUNTER — Ambulatory Visit

## 2023-10-23 DIAGNOSIS — M79605 Pain in left leg: Secondary | ICD-10-CM

## 2023-10-23 DIAGNOSIS — M6281 Muscle weakness (generalized): Secondary | ICD-10-CM

## 2023-10-23 DIAGNOSIS — R262 Difficulty in walking, not elsewhere classified: Secondary | ICD-10-CM
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
## 2023-10-25 ENCOUNTER — Telehealth: Payer: Self-pay

## 2023-10-25 ENCOUNTER — Ambulatory Visit

## 2023-10-25 DIAGNOSIS — Z419 Encounter for procedure for purposes other than remedying health state, unspecified: Secondary | ICD-10-CM | POA: Diagnosis not present

## 2023-10-25 NOTE — Telephone Encounter (Signed)
 Patient called due to no show. Patient apologetic, lost track of time due to son's graduation. Informed of next appt time and date.   Nathan Nelson  Brain Cahill, PT, DPT Physical Therapist - [place] [facility]  Outpatient Physical Therapy- [facility] [phone]

## 2023-10-30 ENCOUNTER — Ambulatory Visit

## 2023-10-30 DIAGNOSIS — R262 Difficulty in walking, not elsewhere classified: Secondary | ICD-10-CM | POA: Diagnosis not present

## 2023-10-30 DIAGNOSIS — M79605 Pain in left leg: Secondary | ICD-10-CM | POA: Diagnosis not present

## 2023-10-30 DIAGNOSIS — M6281 Muscle weakness (generalized): Secondary | ICD-10-CM
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
## 2023-10-31 ENCOUNTER — Ambulatory Visit (INDEPENDENT_AMBULATORY_CARE_PROVIDER_SITE_OTHER): Admitting: Physician Assistant

## 2023-10-31 ENCOUNTER — Encounter: Payer: Self-pay | Admitting: Physician Assistant

## 2023-10-31 VITALS — BP 112/84 | HR 80 | Resp 16 | Ht 63.0 in | Wt 230.0 lb

## 2023-10-31 DIAGNOSIS — M542 Cervicalgia: Secondary | ICD-10-CM | POA: Diagnosis not present

## 2023-10-31 DIAGNOSIS — I10 Essential (primary) hypertension: Secondary | ICD-10-CM | POA: Diagnosis not present

## 2023-10-31 DIAGNOSIS — E785 Hyperlipidemia, unspecified: Secondary | ICD-10-CM

## 2023-10-31 DIAGNOSIS — F339 Major depressive disorder, recurrent, unspecified: Secondary | ICD-10-CM

## 2023-10-31 DIAGNOSIS — F411 Generalized anxiety disorder: Secondary | ICD-10-CM | POA: Diagnosis not present

## 2023-10-31 MED ORDER — BUPROPION HCL ER (XL) 150 MG PO TB24: 150.0000 mg | ORAL_TABLET | Freq: Every day | ORAL | 1 refills | Status: DC

## 2023-10-31 NOTE — Progress Notes (Signed)
 Established patient visit  Patient: Nathan Nelson   DOB: 1986/01/27   38 y.o. Male  MRN: [id number] Visit Date: 10/31/2023  Today's healthcare provider: Blane Bunting, PA-C   Chief Complaint  Patient presents with   Follow-up    3 month f/u pt wants to discuss aching pain inn the back of his neck that moves to his head. Also pt states that he feels like he is loosing oxygen when wt lifting.   Subjective     HPI     Follow-up    Additional comments: 3 month f/u pt wants to discuss aching pain inn the back of his neck that moves to his head. Also pt states that he feels like he is loosing oxygen when wt lifting.      Last edited by Estill Hemming, CMA on 10/31/2023  3:52 PM.       Discussed the use of AI scribe software for clinical note transcription with the patient, who gave verbal consent to proceed.  History of Present Illness Nathan Nelson is a 38 year old male with hypertension who presents with neck and back pain associated with exercise.  He experiences neck and back pain, particularly after exercise, described as an aching sensation in the neck with occasional lightheadedness. The pain is also present in the back and ribs, sometimes tender to touch, and has been ongoing for a couple of years. Swelling in the neck area is occasionally noted. He has a history of lifting heavy weights, approximately 80 to 100 pounds daily for eight hours, which he associates with the onset of his symptoms. He is currently not working and is attending school for real estate. He takes amlodipine  and chlorthalidone  for hypertension and manages cholesterol and blood sugar through diet. He also takes multivitamins regularly.       07/03/2023    4:16 PM 03/02/2023   12:11 PM 08/08/2022    3:46 PM  Depression screen PHQ 2/9  Decreased Interest 2 1 0  Down, Depressed, Hopeless 1 2 1   PHQ - 2 Score 3 3 1   Altered sleeping 3 3 1   Tired, decreased energy 2 1 1   Change in appetite 2 2 1    Feeling bad or failure about yourself  2 0 0  Trouble concentrating 1 0 1  Moving slowly or fidgety/restless 0 0 0  Suicidal thoughts 0 0 0  PHQ-9 Score 13 9 5   Difficult doing work/chores Somewhat difficult Not difficult at all Not difficult at all      07/03/2023    4:17 PM 03/02/2023   12:11 PM 04/19/2022    1:36 PM 06/10/2021    3:46 PM  GAD 7 : Generalized Anxiety Score  Nervous, Anxious, on Edge 2 1 0 1  Control/stop worrying 2 1 3  0  Worry too much - different things 2 1 3  0  Trouble relaxing 1 2 3 1   Restless 0 0 0 0  Easily annoyed or irritable 1 1 1 1   Afraid - awful might happen 1  1 1   Total GAD 7 Score 9  11 4   Anxiety Difficulty Somewhat difficult Not difficult at all Not difficult at all Somewhat difficult    Medications: Outpatient Medications Prior to Visit  Medication Sig   ALPRAZolam  (XANAX ) 0.25 MG tablet Take 1 tablet (0.25 mg total) by mouth daily as needed for anxiety.   amLODipine  (NORVASC ) 10 MG tablet Take 1 tablet (10 mg total) by mouth daily.   buPROPion  (WELLBUTRIN   XL) 300 MG 24 hr tablet Take 1 tablet (300 mg total) by mouth daily.   busPIRone  (BUSPAR ) 5 MG tablet Take 1 tablet (5 mg total) by mouth 2 (two) times daily.   chlorthalidone  (HYGROTON ) 50 MG tablet Take 1 tablet (50 mg total) by mouth daily.   hydrOXYzine  (ATARAX ) 10 MG tablet Take 1 tablet (10 mg total) by mouth 3 (three) times daily as needed for anxiety (For breakthrough anxiety).   ondansetron  (ZOFRAN -ODT) 4 MG disintegrating tablet Take 1 tablet (4 mg total) by mouth every 8 (eight) hours as needed.   oxyCODONE -acetaminophen  (PERCOCET) 5-325 MG tablet Take 1-2 tablets by mouth every 8 (eight) hours as needed for severe pain (pain score 7-10).   potassium chloride  SA (KLOR-CON  M) 20 MEQ tablet Take 1 tablet (20 mEq total) by mouth daily.   valsartan  (DIOVAN ) 40 MG tablet Take 1 tablet (40 mg total) by mouth daily.   No facility-administered medications prior to visit.    Review of  Systems All negative Except see HPI       Objective    BP 112/84 (BP Location: Left Arm, Patient Position: Sitting, Cuff Size: Large)   Pulse 80   Resp 16   Ht 5' 3 (1.6 m)   Wt 230 lb (104.3 kg)   SpO2 100%   BMI 40.74 kg/m     Physical Exam Vitals reviewed.  Constitutional:      General: He is not in acute distress.    Appearance: Normal appearance. He is not diaphoretic.  HENT:     Head: Normocephalic and atraumatic.   Eyes:     General: No scleral icterus.    Conjunctiva/sclera: Conjunctivae normal.    Cardiovascular:     Rate and Rhythm: Normal rate and regular rhythm.     Pulses: Normal pulses.     Heart sounds: Normal heart sounds. No murmur heard. Pulmonary:     Effort: Pulmonary effort is normal. No respiratory distress.     Breath sounds: Normal breath sounds. No wheezing or rhonchi.   Musculoskeletal:     Cervical back: Neck supple.     Right lower leg: No edema.     Left lower leg: No edema.  Lymphadenopathy:     Cervical: No cervical adenopathy.   Skin:    General: Skin is warm and dry.     Findings: No rash.   Neurological:     Mental Status: He is alert and oriented to person, place, and time. Mental status is at baseline.   Psychiatric:        Mood and Affect: Mood normal.        Behavior: Behavior normal.      No results found for any visits on 10/31/23.      Assessment & Plan Musculoskeletal Pain Intermittent neck and back pain, likely due to muscle tension or strain from previous heavy lifting. Occasional swelling and lightheadedness during exercise noted. - Apply Voltaren  gel to painful areas. - Use acetaminophen  for pain management. - Apply heat and ice to affected areas, ensuring limited exposure time. - Massage the area using a lacrosse ball in a pillowcase. - Consider chiropractic care or massage therapy. - Engage in stretching exercises and yoga, particularly for the upper back and neck.  Anxiety and  Depression Chronic and worsening Previously managed with medication, stopped in 2023. Discussed benefits and risks of resuming medication, including potential weight loss and energy improvement. - Prescribe medication for anxiety and depression, starting with one tablet  to monitor response. - Schedule follow-up appointment in one month to assess response to medication. Depression>Anxiety Advised to continue taking wellbutrin  150mg  daily Will reassess in a month  Hypertension Chronic and stable Blood pressure well-controlled with amlodipine  and chlorthalidone . - Continue current blood pressure medications: amlodipine  and chlorthalidone . Will follow-up  Hyperlipidemia Chronic and stable High cholesterol levels, not currently on medication. Emphasized importance of managing cholesterol, especially in African American patients. - Prescribe cholesterol medication. Rosuvastatin 5mg  Will follow-up  General Health Maintenance Encouraged healthy lifestyle through diet and exercise. Discussed regular monitoring of kidney function and A1c levels. - Order A1c and kidney function tests at the next appointment. - Encourage continuation of diet and exercise regimen.    No orders of the defined types were placed in this encounter.   No follow-ups on file.   The patient was advised to call back or seek an in-person evaluation if the symptoms worsen or if the condition fails to improve as anticipated.  I discussed the assessment and treatment plan with the patient. The patient was provided an opportunity to ask questions and all were answered. The patient agreed with the plan and demonstrated an understanding of the instructions.  I, Theophil Thivierge, PA-C have reviewed all documentation for this visit. The documentation on 10/31/2023  for the exam, diagnosis, procedures, and orders are all accurate and complete.  Blane Bunting, [facility], MMS [facility] [phone]  (phone) [phone] (fax)  [facility]

## 2023-11-01 ENCOUNTER — Encounter (HOSPITAL_BASED_OUTPATIENT_CLINIC_OR_DEPARTMENT_OTHER): Payer: Medicaid Other | Admitting: Internal Medicine

## 2023-11-06 ENCOUNTER — Ambulatory Visit

## 2023-11-06 ENCOUNTER — Encounter: Payer: Self-pay | Admitting: Physical Therapy

## 2023-11-06 DIAGNOSIS — M79605 Pain in left leg: Secondary | ICD-10-CM | POA: Diagnosis not present

## 2023-11-06 DIAGNOSIS — R262 Difficulty in walking, not elsewhere classified: Secondary | ICD-10-CM | POA: Diagnosis not present

## 2023-11-06 DIAGNOSIS — M6281 Muscle weakness (generalized): Secondary | ICD-10-CM | POA: Diagnosis not present
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
## 2023-11-08 ENCOUNTER — Ambulatory Visit
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
## 2023-11-12 ENCOUNTER — Ambulatory Visit: Admitting: Physical Therapy

## 2023-11-13 ENCOUNTER — Ambulatory Visit: Attending: Orthopedic Surgery | Admitting: Physical Therapy

## 2023-11-13 ENCOUNTER — Telehealth: Payer: Self-pay | Admitting: Physical Therapy
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
## 2023-11-13 NOTE — Telephone Encounter (Signed)
 Pt contacted via telephone and author left voice mail informing of missed appointment and informed pt of future PT appointment date and time.   Casimiro Needle, PT, DPT, NCS, CSRS

## 2023-11-15 ENCOUNTER — Ambulatory Visit

## 2023-11-15 ENCOUNTER — Telehealth: Payer: Self-pay

## 2023-11-15 NOTE — Telephone Encounter (Signed)
 Called patient to inform about missed PT appointment. No answer but LVM informing pt about no show policy and if he wishes to continue PT he will only be able to schedule one appointment at a time.

## 2023-11-19 ENCOUNTER — Encounter: Payer: Self-pay | Admitting: Physician Assistant

## 2023-11-20 ENCOUNTER — Ambulatory Visit: Admitting: Physical Therapy

## 2023-11-24 DIAGNOSIS — Z419 Encounter for procedure for purposes other than remedying health state, unspecified: Secondary | ICD-10-CM | POA: Diagnosis not present

## 2023-11-27 ENCOUNTER — Encounter: Admitting: Physical Therapy

## 2023-11-29 ENCOUNTER — Encounter

## 2023-12-04 ENCOUNTER — Encounter

## 2023-12-06 ENCOUNTER — Encounter

## 2023-12-11 ENCOUNTER — Encounter: Admitting: Physical Therapy

## 2023-12-12 ENCOUNTER — Ambulatory Visit: Admitting: Physician Assistant

## 2023-12-13 ENCOUNTER — Encounter: Admitting: Physical Therapy

## 2023-12-14 ENCOUNTER — Encounter: Payer: Self-pay | Admitting: Physician Assistant

## 2023-12-17 ENCOUNTER — Other Ambulatory Visit: Payer: Self-pay

## 2023-12-17 NOTE — Telephone Encounter (Signed)
 LOV6/18/25 NOV 01/01/24 LRF 09/22/23 30 x 0

## 2023-12-18 ENCOUNTER — Encounter

## 2023-12-18 NOTE — Telephone Encounter (Signed)
 Called pt regarding provider message below. No response, lvm to callback to have a little more clarification on medicine.

## 2023-12-19 DIAGNOSIS — M7052 Other bursitis of knee, left knee: Secondary | ICD-10-CM | POA: Diagnosis not present

## 2023-12-19 DIAGNOSIS — M25562 Pain in left knee: Secondary | ICD-10-CM | POA: Diagnosis not present

## 2023-12-20 ENCOUNTER — Encounter

## 2023-12-24 MED ORDER — HYDROXYZINE HCL 10 MG PO TABS: 10.0000 mg | ORAL_TABLET | Freq: Three times a day (TID) | ORAL | 0 refills | Status: DC | PRN

## 2023-12-25 ENCOUNTER — Encounter

## 2023-12-25 DIAGNOSIS — Z419 Encounter for procedure for purposes other than remedying health state, unspecified: Secondary | ICD-10-CM | POA: Diagnosis not present

## 2024-01-01 ENCOUNTER — Encounter: Payer: Self-pay | Admitting: Physician Assistant

## 2024-01-01 ENCOUNTER — Ambulatory Visit (INDEPENDENT_AMBULATORY_CARE_PROVIDER_SITE_OTHER): Admitting: Physician Assistant

## 2024-01-01 VITALS — BP 131/86 | HR 85 | Ht 65.0 in | Wt 232.5 lb

## 2024-01-01 DIAGNOSIS — E785 Hyperlipidemia, unspecified: Secondary | ICD-10-CM

## 2024-01-01 DIAGNOSIS — M25561 Pain in right knee: Secondary | ICD-10-CM

## 2024-01-01 DIAGNOSIS — G8929 Other chronic pain: Secondary | ICD-10-CM | POA: Diagnosis not present

## 2024-01-01 DIAGNOSIS — Z23 Encounter for immunization: Secondary | ICD-10-CM

## 2024-01-01 DIAGNOSIS — M7918 Myalgia, other site: Secondary | ICD-10-CM

## 2024-01-01 DIAGNOSIS — R7303 Prediabetes: Secondary | ICD-10-CM | POA: Diagnosis not present

## 2024-01-01 DIAGNOSIS — F411 Generalized anxiety disorder: Secondary | ICD-10-CM

## 2024-01-01 DIAGNOSIS — I10 Essential (primary) hypertension: Secondary | ICD-10-CM | POA: Diagnosis not present

## 2024-01-01 NOTE — Progress Notes (Signed)
 Established patient visit  Patient: Nathan Nelson   DOB: Oct 12, 1985   38 y.o. Male  MRN: [id number] Visit Date: 01/01/2024  Today's healthcare provider: Jolynn Spencer, PA-C   Chief Complaint  Patient presents with   Follow-up    Patient is present to follow-up on knee pain. He was last seen 07/31/23. Pt was not persuade at the time due to managing well with gym exercises, no current pain.Continuation of gym exercises was encouraged,advised to pursue physical therapy if pain worsens or current management became insufficient, and recommend stretching and warming up before exercises.    Knee Pain    Patient reports things have still being going good with exercise and no PT. No pain   Care Management    HPV Vaccines - ok to order    Subjective     HPI     Follow-up    Additional comments: Patient is present to follow-up on knee pain. He was last seen 07/31/23. Pt was not persuade at the time due to managing well with gym exercises, no current pain.Continuation of gym exercises was encouraged,advised to pursue physical therapy if pain worsens or current management became insufficient, and recommend stretching and warming up before exercises.         Knee Pain    Additional comments: Patient reports things have still being going good with exercise and no PT. No pain        Care Management    Additional comments: HPV Vaccines - ok to order       Last edited by Lilian Fitzpatrick, CMA on 01/01/2024  1:29 PM.       Discussed the use of AI scribe software for clinical note transcription with the patient, who gave verbal consent to proceed.  History of Present Illness Nathan Nelson is a 38 year old male with chronic knee pain, hypertension, and anxiety who presents for evaluation of persistent knee pain and management of hypertension and anxiety.  He experiences persistent knee pain following knee surgery, described as mild but occasionally sharp, located at the knee. Pain  management includes Voltaren , acetaminophen , and the application of heat or ice. 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 Despite these measures, the pain persists but is generally manageable.  He is on amlodipine  and chlorthalidone  for hypertension, with previously stable blood pressure. He takes his medications in the morning and monitors his blood pressure at home with a cuff.  He experiences anxiety more than depression and has been prescribed Wellbutrin  in the past but does not take it. He occasionally uses hydroxyzine  for anxiety, which was recently refilled. Anxiety increases when unable to work due to appointments.  He reports musculoskeletal pain in his back and shoulder area, attributed to tight muscles, and uses a cream and a tennis ball for relief. No shortness of breath or rapid heart beating.       01/01/2024    1:28 PM 10/31/2023    4:47 PM 07/03/2023    4:16 PM  Depression screen PHQ 2/9  Decreased Interest 0 0 2  Down, Depressed, Hopeless 0 0 1  PHQ - 2 Score 0 0 3  Altered sleeping 2 3 3   Tired, decreased energy 1 1 2   Change in appetite 1 1 2   Feeling bad or failure about yourself  1 0 2  Trouble concentrating 1 1 1   Moving slowly or fidgety/restless 0 0 0  Suicidal thoughts  0 0  PHQ-9  Score 6 6 13   Difficult doing work/chores  Somewhat difficult Somewhat difficult      01/01/2024    1:28 PM 10/31/2023    4:48 PM 07/03/2023    4:17 PM 03/02/2023   12:11 PM  GAD 7 : Generalized Anxiety Score  Nervous, Anxious, on Edge 1 1 2 1   Control/stop worrying 1 1 2 1   Worry too much - different things 1 1 2 1   Trouble relaxing 0 1 1 2   Restless 0 0 0 0  Easily annoyed or irritable 1 1 1 1   Afraid - awful might happen 1 1 1    Total GAD 7 Score 5 6 9    Anxiety Difficulty  Somewhat difficult Somewhat difficult Not difficult at all    Medications: Outpatient Medications Prior to Visit  Medication Sig   amLODipine   (NORVASC ) 10 MG tablet Take 1 tablet (10 mg total) by mouth daily.   chlorthalidone  (HYGROTON ) 50 MG tablet Take 1 tablet (50 mg total) by mouth daily.   hydrOXYzine  (ATARAX ) 10 MG tablet Take 1 tablet (10 mg total) by mouth 3 (three) times daily as needed for anxiety (For breakthrough anxiety).   potassium chloride  SA (KLOR-CON  M) 20 MEQ tablet Take 1 tablet (20 mEq total) by mouth daily.   ALPRAZolam  (XANAX ) 0.25 MG tablet Take 1 tablet (0.25 mg total) by mouth daily as needed for anxiety. (Patient not taking: Reported on 01/01/2024)   buPROPion  (WELLBUTRIN  XL) 150 MG 24 hr tablet Take 1 tablet (150 mg total) by mouth daily. (Patient not taking: Reported on 01/01/2024)   busPIRone  (BUSPAR ) 5 MG tablet Take 1 tablet (5 mg total) by mouth 2 (two) times daily. (Patient not taking: Reported on 01/01/2024)   ondansetron  (ZOFRAN -ODT) 4 MG disintegrating tablet Take 1 tablet (4 mg total) by mouth every 8 (eight) hours as needed. (Patient not taking: Reported on 01/01/2024)   oxyCODONE -acetaminophen  (PERCOCET) 5-325 MG tablet Take 1-2 tablets by mouth every 8 (eight) hours as needed for severe pain (pain score 7-10). (Patient not taking: Reported on 01/01/2024)   No facility-administered medications prior to visit.    Review of Systems All negative Except see HPI       Objective    BP (!) 145/89 (BP Location: Right Arm, Patient Position: Sitting, Cuff Size: Normal)   Pulse 90   Ht 5' 5 (1.651 m)   Wt 232 lb 8 oz (105.5 kg)   BMI 38.69 kg/m     Physical Exam Vitals reviewed.  Constitutional:      General: He is not in acute distress.    Appearance: Normal appearance. He is obese. He is not diaphoretic.  HENT:     Head: Normocephalic and atraumatic.  Eyes:     General: No scleral icterus.    Conjunctiva/sclera: Conjunctivae normal.  Cardiovascular:     Rate and Rhythm: Normal rate and regular rhythm.     Pulses: Normal pulses.     Heart sounds: Normal heart sounds. No murmur  heard. Pulmonary:     Effort: Pulmonary effort is normal. No respiratory distress.     Breath sounds: Normal breath sounds. No wheezing or rhonchi.  Musculoskeletal:     Cervical back: Neck supple.     Right lower leg: No edema.     Left lower leg: No edema.  Lymphadenopathy:     Cervical: No cervical adenopathy.  Skin:    General: Skin is warm and dry.     Findings: No rash.  Neurological:  Mental Status: He is alert and oriented to person, place, and time. Mental status is at baseline.  Psychiatric:        Mood and Affect: Mood normal.        Behavior: Behavior normal.      No results found for any visits on 01/01/24.      Assessment and Plan Assessment & Plan Chronic right knee pain post-patellectomy Chronic pain post-patellectomy, exacerbated by activity, managed with stability and support. - Continue Voltaren  gel and acetaminophen  as needed. - Apply heat or ice based on symptoms. - Wear knee brace during demanding activities. - Incorporate daily stretching exercises.  Chronic musculoskeletal pain (upper back, shoulder, and rib area) Chronic pain likely due to muscle tightness, with previous costochondritis diagnosis. Focus on non-pharmacological management. - Continue exercise regimen, consider yoga. - Consider chiropractic care or physical therapy. - Avoid chronic prescription pain medication use. - Monitor for changes in pain character.  Hypertension Chronic  hypertension with elevated blood pressure, possibly due to non-compliance and recent food intake. Emphasis on lifestyle and medication adherence. - Measure blood pressure at home twice daily, bring readings to next appointment. - Take chlorthalidone  50 Mg in the morning, amlodipine  10 Mg p.o. in the evening. - Avoid salty foods, continue exercise. - Complete blood work before next appointment.  Anxiety disorder Anxiety disorder with predominant anxiety symptoms. Not taking Wellbutrin  due to anxiety  concerns. Hydroxyzine  used as needed. - Continue hydroxyzine  as needed. - Consider Buspar  if needed. - Engage in regular exercise. - Referral to counseling for anxiety and depression management.  Collaboration of Care: Medication Management AEB if applicable, Primary Care Provider AEB  , Psychiatrist AEB  , and Referral or follow-up with counselor/therapist AEB    Patient/Guardian was advised Release of Information must be obtained prior to any record release in order to collaborate their care with an outside provider. Patient/Guardian was advised if they have not already done so to contact the registration department to sign all necessary forms in order for us  to release information regarding their care.   Consent: Patient/Guardian gives verbal consent for treatment and assignment of benefits for services provided during this visit. Patient/Guardian expressed understanding and agreed to proceed.   Immunization due (Primary)  - HPV 9-valent vaccine,Recombinat , Morbid obesity (HCC) Chronic and stable Current BMI 38.69 Advised to continue with lifestyle modifications Review of lab work from 07/17/2023 Will need updated labs Consider weight loss medication if Patient agrees Will follow-up after receiving lab work  Prediabetes Chronic and stable Last A1c was 5.9 Low-carb diet and regular exercise advised A1c was ordered Will reassess  No orders of the defined types were placed in this encounter.   No follow-ups on file.   The patient was advised to call back or seek an in-person evaluation if the symptoms worsen or if the condition fails to improve as anticipated.  I discussed the assessment and treatment plan with the patient. The patient was provided an opportunity to ask questions and all were answered. The patient agreed with the plan and demonstrated an understanding of the instructions.  I, Zyeir Dymek, PA-C have reviewed all documentation for this visit. The documentation  on 01/01/2024  for the exam, diagnosis, procedures, and orders are all accurate and complete.  Jolynn Spencer, [facility], MMS [facility] [phone] (phone) [phone] (fax)  [facility]

## 2024-01-02 ENCOUNTER — Ambulatory Visit (HOSPITAL_BASED_OUTPATIENT_CLINIC_OR_DEPARTMENT_OTHER): Admitting: Nurse Practitioner

## 2024-01-02 ENCOUNTER — Encounter (HOSPITAL_BASED_OUTPATIENT_CLINIC_OR_DEPARTMENT_OTHER): Payer: Self-pay | Admitting: Nurse Practitioner

## 2024-01-02 VITALS — BP 118/82 | HR 72 | Ht 65.0 in | Wt 238.0 lb

## 2024-01-02 DIAGNOSIS — E876 Hypokalemia: Secondary | ICD-10-CM

## 2024-01-02 DIAGNOSIS — R7303 Prediabetes: Secondary | ICD-10-CM

## 2024-01-02 DIAGNOSIS — I10 Essential (primary) hypertension: Secondary | ICD-10-CM

## 2024-01-02 DIAGNOSIS — Z7189 Other specified counseling: Secondary | ICD-10-CM | POA: Diagnosis not present

## 2024-01-02 DIAGNOSIS — Z720 Tobacco use: Secondary | ICD-10-CM

## 2024-01-02 DIAGNOSIS — Z79899 Other long term (current) drug therapy: Secondary | ICD-10-CM

## 2024-01-02 DIAGNOSIS — R072 Precordial pain: Secondary | ICD-10-CM

## 2024-01-02 DIAGNOSIS — E782 Mixed hyperlipidemia: Secondary | ICD-10-CM | POA: Diagnosis not present

## 2024-01-02 NOTE — Progress Notes (Signed)
 Cardiology Office Note   Date:  01/02/2024  ID:  Nathan Nelson, Nathan Nelson 05/23/1985, MRN [id number] PCP: Dineen Channel, PA-C  [facility] Providers Cardiologist:  None     PMH Dyslipidemia Hypertension Palpitations Former tobacco abuse Hypokalemia OSA  Seen remotely by Dr. Nishan in 2014 for hypertension. He established with Dr. Mona 05/01/2023 for management of hypertension and OSA. He reported lifestyle issues including substance abuse which he has subsequently discontinued. BP has not been well controlled. Noted to have significant hypokalemia at time of knee surgery recently. Has been on chlorthalidone  50 mg daily and amlodipine  10 mg daily.  BP at time of visit 120/24. Was prescribed carvedilol  but not taking. Potassium in December 2023 2.9. Two months prior to office visit, K+ was 2.7 which was repleted and rose to 3.9. Creatinine mildly elevated at 1.28. Not on any lipid lowering therapies with LDL as high as 250 years prior, now down to 145, triglycerides 117 and HDL 39. He was tested for hyperaldosternoism which was negative. K+ was 3.3 and he was advised to start potassium chloride  20 mEq daily.    History of Present Illness  Discussed the use of AI scribe software for clinical note transcription with the patient, who gave verbal consent to proceed.  History of Present Illness Nathan Nelson is a very pleasant 38 year old male who presents for follow-up of dyslipidemia and hypertension. He experiences chest pain that varies between sharp and aching, localized around the sternum and sometimes radiating to the back. The pain occurs regardless of activity or rest and is exacerbated by certain postures or activities and with palpation. 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 He also lifts weights frequently for exercise. Pain has not worsened with exertional activities. Soreness is noted  upon palpation in certain areas. He experiences palpitations, particularly when wearing a waist trainer and holding his breath. Blood pressure readings at home range from the 120s/80s to 140s/90s, with higher readings after physical activity. No shortness of breath, orthopnea, PND, edema, presyncope or syncope. Family history of hypertension and diabetes in his father, with no immediate family history of myocardial infarction or cerebrovascular accidents. He occasionally smokes, especially when consuming alcohol, but is working on cessation. His diet includes fruits and he is mindful of processed food intake. He sometimes forgets to take his prescribed potassium supplement.  ROS: See HPI  Studies Reviewed      No results found for: LIPOA  Risk Assessment/Calculations           Physical Exam VS:  BP 118/82 (BP Location: Left Arm, Patient Position: Sitting, Cuff Size: Large)   Pulse 72   Ht 5' 5 (1.651 m)   Wt 238 lb (108 kg)   BMI 39.61 kg/m    Wt Readings from Last 3 Encounters:  01/02/24 238 lb (108 kg)  01/01/24 232 lb 8 oz (105.5 kg)  10/31/23 230 lb (104.3 kg)    GEN: Well nourished, well developed in no acute distress NECK: No JVD; No carotid bruits CARDIAC: RRR, no murmurs, rubs, gallops RESPIRATORY:  Clear to auscultation without rales, wheezing or rhonchi  ABDOMEN: Soft, non-tender, non-distended EXTREMITIES:  No edema; No deformity    Assessment & Plan Chest pain Cardiac risk   Intermittent sharp and aching chest pains, with occasional radiation into his back. Pain is not worsened with exertion and is not associated with shortness of breath,  n/v, or diaphoresis. Has noted improvement with massage and was advised to use a tennis ball for muscle tension relief.  -Recommend continuing physically active lifestyle along with aiming for at least 150 minutes of moderate intensity exercise each week -Heart healthy, mostly whole food diet recommended limiting processed foods,  saturated fat, sugar and other simple carbohydreates -Goal LDL at least < 100 due to no known CV disease  Hypokalemia   History of low potassium levels associated with chlorthalidone  use. He has discontinued potassium supplements inadvertently. Occasional palpitations noted.  -We will recheck kidney function and potassium levels today -Will advise on potassium supplementation based on lab results  Hyperlipidemia   Lipid panel completed 07/17/23 with total cholesterol 228, HDL 40, triglycerides 81, and LDL 174 mg/dL, above target. Discussed the risk of plaque formation and cardiovascular events, emphasizing the importance of lowering cholesterol.  -We will recheck lipid panel today  -Recommend starting rosuvastatin  if cholesterol levels remain elevated - Advise dietary modifications to include more fruits, vegetables, whole grains, and lean proteins -Encourage regular cardiovascular exercise for goal of 150 minutes or more per week along with weightlifting and resistance training at least 3 days per week  Hypertension   Blood pressure is well-controlled with occasional higher readings post-activity.  -Continue current anti-hypertensive therapy  -We will reassess renal function today -Routinely monitor BP for goal < 130/80  Pre-diabetes   A1c level is 5.9%, indicating pre-diabetes.  -Advise dietary modifications to control blood glucose levels and encourage regular physical activity to improve insulin sensitivity  Smoking cessation   Occasional smoking is associated with alcohol consumption. He acknowledge the need to quit smoking and reduce alcohol intake for cardiovascular health.  -Complete smoking cessation advised        Dispo: 6 months with me  Signed, Rosaline Bane, NP-C

## 2024-01-02 NOTE — Patient Instructions (Addendum)
 Medication Instructions:   Your physician recommends that you continue on your current medications as directed. Please refer to the Current Medication list given to you today.  *If you need a refill on your cardiac medications before your next appointment, please call your pharmacy*   Lab Work:  TODAY ON THE 3RD FLOOR AT [address]--LIPID, CMET, AND LIPOPROTEIN A  If you have labs (blood work) drawn today and your tests are completely normal, you will receive your results only by: MyChart Message (if you have MyChart) OR A paper copy in the mail If you have any lab test that is abnormal or we need to change your treatment, we will call you to review the results.   Follow-Up:  6 MONTHS WITH ROSALINE BANE NP IN LIPID CLINIC  Adopting a Healthy Lifestyle.   Weight: Know what a healthy weight is for you (roughly BMI <25) and aim to maintain this. You can calculate your body mass index on your smart phone. Unfortunately, this is not the most accurate measure of healthy weight, but it is the simplest measurement to use. A more accurate measurement involves body scanning which measures lean muscle, fat tissue and bony density. We do not have this equipment at [facility].    Diet: Aim for 7+ servings of fruits and vegetables daily Limit animal fats in diet for cholesterol and heart health - choose grass fed whenever available Avoid highly processed foods (fast food burgers, tacos, fried chicken, pizza, hot dogs, french fries)  Saturated fat comes in the form of butter, lard, coconut oil, margarine, partially hydrogenated oils, and fat in meat. These increase your risk of cardiovascular disease.  Use healthy plant oils, such as olive, canola, soy, corn, sunflower and peanut.  Whole foods such as fruits, vegetables and whole grains have fiber  Men need > 38 grams of fiber per day Women need > 25 grams of fiber per day  Load up on vegetables and fruits - one-half of your plate: Aim for color  and variety, and remember that potatoes dont count. Go for whole grains - one-quarter of your plate: Whole wheat, barley, wheat berries, quinoa, oats, brown rice, and foods made with them. If you want pasta, go with whole wheat pasta. Protein power - one-quarter of your plate: Fish, chicken, beans, and nuts are all healthy, versatile protein sources. Limit red meat. You need carbohydrates for energy! The type of carbohydrate is more important than the amount. Choose carbohydrates such as vegetables, fruits, whole grains, beans, and nuts in the place of white rice, white pasta, potatoes (baked or fried), macaroni and cheese, cakes, cookies, and donuts.  If youre thirsty, drink water. Coffee and tea are good in moderation, but skip sugary drinks and limit milk and dairy products to one or two daily servings. Keep sugar intake at 6 teaspoons or 24 grams or LESS       Exercise: Aim for 150 min of moderate intensity exercise weekly for heart health, and weights twice weekly for bone health Stay active - any steps are better than no steps! Aim for 7-9 hours of sleep daily        Tackling Obesity with Lifestyle Changes  Obesity- What is it? And What can we do about it?  Obesity is a chronic complex disease defined as excessive fat deposits that can have a negative effect on our health. It can lead to many other diseases including type 2 diabetes.  Weight gain occurs when the amount of energy (calories) we  consume is greater than the amount we use.  When our energy output is greater than our energy input we lose weight. The basic concept is simple, but in reality, it's much more complicated.  Unfortunately, in some people, our bodies have many ways it can compensate when we try to eat less and move more which can prevent us  from changing our weight. This can lead to some people having a much more difficult time losing weight even when they put healthy habits into practice. This can be  frustrating. We want to focus on healthy habits, physical activity and how we feel, and less the number on the scale.  Food As Energy  Calories  Calories is just a unit of measurement for energy.  Counting calories is not required to lose weight but counting for a short period of time can:   help you learn good portion sizes   Learn what your true energy needs are.   Help you be more aware of your snacking or grazing habits  To help calculate how many calories you should be eating, the NIH has a great body weight planner calculator at BeverageBuggy.si  Types of Energy Expenditure  Basal Metabolic Rate (BMR) Energy that our bodies use to preform everyday tasks. More muscle mass through resistance training can increase this a small amount  Thermic Effect of Food The amount of energy that it takes to breakdown the food we eat. This will be highest when we eat protein and fiber rich foods  Exercise Energy Expenditure The amount of energy used during formal exercise (walking, biking, weightlifting)  Non-exercise activity thermogenesis (NEAT) The amount of energy spent on activities that are not formal exercise (standing, fidgeting). Therefore, it is not only important to do formal exercise but also move around throughout the day.  Managing The Meal  Macro nutrients (carbohydrates, fats and protein, fiber, water)  Micronutrients (vitamins, minerals)  Dietary Fiber  Benefits Examples Cautions  Soluble fiber  Decreases cholesterol  improve blood sugar control,  Feeds our gut bacteria  Allows us  to feel fuller for longer so we eat less  fruits  oats  barley  legumes  peas  Beans  vegetables (broccoli) and root vegetables (carrots) Add fiber into your diet slowly and be sure to drink at least 8 cups of water a day. This will help limit gas, bloating, diarrhea, or constipation.  Insoluble Fiber  Improves digestive health by making stool easier to pass  Allows us  to  feel fuller for longer so we eat less  whole grains  nuts  seeds  skin of fruit  vegetables (green beans, zucchini, cauliflower)  Tricks to add more fiber to your diet   Add beans (pinto, kidney, lima, navy and garbanzo) to salads, ground meat or brown rice   Add nuts or seeds and or fresh/frozen fruit to yogurt, cottage cheese, salads or steel cut oats   Cut up vegetables and eat with hummus   Look for unsweetened whole grain cereals with at least 5g of fiber per serving   Switch to whole grain bread. Look for bread that has whole grain flour as the first ingredient and has more fiber than carbs if you were to multiple the fiber x 10.   Try bulgar, barely, quinoa, buckwheat, brown rice wild rice instead of white rice   Keep frozen vegetables on hand to add to dishes or soups  Meal Planning:  Meal planning is the key to setting you up for success. Here are  some examples of healthy meal options.  Breakfast  Option 1: Omelette with vegetables (1 egg, spinach, mushrooms, or other vegetable of your choice), 2 slices whole-grain toast, tip of thumb size butter or soft margarine,  cup low-fat milk or yogurt  Option 2: steel-cut rolled oats (? cup dry), 1 tbsp peanut butter added to cooked oats,  cup low-fat milk.  Option 3: 2 slices whole-grain or rye toast with avocado spread ( small avocado mased with herbs and pepper to taste), 1 poached egg or [place] up (cooked to your liking)  Option 4:  cup plain 0% [place] yogurt topped with  cup berries and  cup walnuts or almonds, 2 slices whole-grain or rye toast, tip of thumb size soft margarine/butter  Lunch:  Option 1: 2 cups red lentil soup, green salad with 1 tbsp homemade vinaigrette (extra virgin olive oil and vinegar of choice plus spices)  Option 2: 3 oz. roasted chicken, 2 slices whole-grain bread, 2 tsp mayonnaise, mustard, lettuce, tomato if desired, 1 fruit (example: medium-sized apple or small pear)  Option  3: 3 oz. tuna packed in water, 1 whole-wheat pita (6 inch), 2 tsp mayonnaise, lettuce, tomato, or other non-starchy vegetable of your choice, 1 fruit (example: medium-sized apple or small pear)  Option 4: 1 serving of garden veggie buddha bowl with lentils and tahini sauce and 1 cup berries topped with  cup plain 0% Greek yogurt  Dinner:  Option 1: 1 serving roasted cauliflower salad, 3-4 oz. grilled or baked pork loin chop, 1/2 cup mashed potato, or brown rice or quinoa  Option 2: 1 serving fish (baked, grilled or air fried), green salad, 1 tbsp homemade vinaigrette,  cup cooked couscous  Option 3: 1 cup cooked whole grained pasta (example: spaghetti, spirals, macaroni),  cup favorite pasta sauce (preferably homemade), 3-4 oz. grilled or baked chicken, green salad, 1 tbsp homemade vinaigrette  Option 4: 1 serving oven roasted salmon,  cup mashed sweet potato or couscous or brown rice or quinoa, broccoli (steamed or roasted)  Healthy snacks:   Carrots or celery with 1 tbsp of hummus   1 medium-sized fruit (apple or orange)   1 cup plain 0% [place] yogurt with  cup berries   Half apple, sliced, with 1 tbsp (15 mL) peanut or almond butter  Dining out:  Eating away from home has become a part of many people's lifestyle. Making healthy choices when you are eating out is important too. Portion size is an important part of healthy choices. Most branded fast-food places provide calories, sodium, and fat content for their menu items. www.calorieking.com would be great resource to find nutrition facts for your favorite brands and fast-food restaurants. Company specific website can be Chief Technology Officer for nutrition information for their items. (e.g. www.mcdonalds.com or www.nutritionix.com/[place]/menu/premium)  Here are some tips to help you make wise food choices when you are dining out.  Chose more often Avoid  Beverages   Choose more often: Water, low fat milk  Sugar-free/diet  drinks  Unsweet tea or coffee    Avoid: Milkshakes, fruit drinks, regular pop  Alcohol, specialty drinks (e.g. iced cappuccino)  Fast food  Choose more often:  Garden salad  Mini subs, pita sandwiches ect with extra vegetables  plain burgers, grilled chicken  Vegetarian or cheese pizza with whole-grain crust    Avoid: Burgers/sandwiches with bacon, cheese, and high-fat sauces  [place] fries, fried chicken, fried fish, poutine, hash browns  Pizza with processed meats  Starters   Choose more often:  Raw vegetables, salads (garden, spinach, fruit)  clear or vegetable soups  Seafood cocktail  Whole-grain breads and rolls    Avoid: Salads with high-fat dressings or toppings  Creamy soups  Wings, egg rolls  onion rings, nachos  White or garlic bread  Main courses Grains & Starches (amount equal to  of your plate)  Choose more often:  Oatmeal, high-fiber/lower-sugar cereals  Whole-grain breads, rice, pasta, barley, couscous  Sweet potatoes    Avoid: Sugary, low-fiber cereals  Large bagels, muffins, croissants, white bread  [place] fries, hash browns, fried rice   Meat and alternative (amount equal to  of your plate)  Choose more often:  Lean meats, poultry, fish, eggs, low-fat cheese  Tofu, vegetable protein Legumes (e.g. lentils, chickpeas, beans)    Avoid: High-salt and/or high-fat meats (e.g. ribs, wings, sausages, wieners, processed lunch meats, imposter meats)   Vegetables (amount equal to  of your plate)  Choose more often:  Salads ([place], garden, spinach), plain vegetables   Avoid:  Salads with creamy, high-fat dressings and   Vegetables on sandwiches ect toppings like bacon bits, croutons, cheese  Desserts  Choose more often:  Fresh fruit, frozen yogourt, skim milk latte    Avoid: Cakes, pies, pastries, ice cream, cheesecake

## 2024-01-03 ENCOUNTER — Ambulatory Visit: Payer: Self-pay | Admitting: Nurse Practitioner

## 2024-01-03 DIAGNOSIS — E782 Mixed hyperlipidemia: Secondary | ICD-10-CM

## 2024-01-03 DIAGNOSIS — R7303 Prediabetes: Secondary | ICD-10-CM

## 2024-01-03 DIAGNOSIS — E876 Hypokalemia: Secondary | ICD-10-CM

## 2024-01-03 LAB — COMPREHENSIVE METABOLIC PANEL WITH GFR
ALT: 21 IU/L (ref 0–44)
AST: 19 IU/L (ref 0–40)
Albumin: 4.3 g/dL (ref 4.1–5.1)
Alkaline Phosphatase: 98 IU/L (ref 44–121)
BUN/Creatinine Ratio: 15 (ref 9–20)
BUN: 16 mg/dL (ref 6–20)
Bilirubin Total: 0.3 mg/dL (ref 0.0–1.2)
CO2: 29 mmol/L (ref 20–29)
Calcium: 10.1 mg/dL (ref 8.7–10.2)
Chloride: 96 mmol/L (ref 96–106)
Creatinine, Ser: 1.09 mg/dL (ref 0.76–1.27)
Globulin, Total: 3.3 g/dL (ref 1.5–4.5)
Glucose: 81 mg/dL (ref 70–99)
Potassium: 3.8 mmol/L (ref 3.5–5.2)
Sodium: 142 mmol/L (ref 134–144)
Total Protein: 7.6 g/dL (ref 6.0–8.5)
eGFR: 89 mL/min/1.73 (ref >59)

## 2024-01-03 LAB — LIPID PANEL
Chol/HDL Ratio: 4.9 ratio (ref 0.0–5.0)
Cholesterol, Total: 204 mg/dL — ABNORMAL HIGH (ref 100–199)
HDL: 42 mg/dL (ref >39)
LDL Chol Calc (NIH): 147 mg/dL — ABNORMAL HIGH (ref 0–99)
Triglycerides: 82 mg/dL (ref 0–149)
VLDL Cholesterol Cal: 15 mg/dL (ref 5–40)

## 2024-01-03 LAB — LIPOPROTEIN A (LPA): Lipoprotein (a): 24.8 nmol/L (ref <75.0)

## 2024-01-03 MED ORDER — ROSUVASTATIN CALCIUM 10 MG PO TABS: 10.0000 mg | ORAL_TABLET | Freq: Every day | ORAL | 3 refills | Status: AC

## 2024-01-03 MED ORDER — POTASSIUM CHLORIDE CRYS ER 20 MEQ PO TBCR: 20.0000 meq | EXTENDED_RELEASE_TABLET | Freq: Every day | ORAL | 3 refills | Status: AC

## 2024-01-25 ENCOUNTER — Ambulatory Visit: Admitting: Physician Assistant

## 2024-01-25 DIAGNOSIS — Z419 Encounter for procedure for purposes other than remedying health state, unspecified: Secondary | ICD-10-CM | POA: Diagnosis not present

## 2024-02-08 ENCOUNTER — Ambulatory Visit: Admitting: Physician Assistant

## 2024-02-24 DIAGNOSIS — Z419 Encounter for procedure for purposes other than remedying health state, unspecified: Secondary | ICD-10-CM | POA: Diagnosis not present

## 2024-04-25 DIAGNOSIS — Z419 Encounter for procedure for purposes other than remedying health state, unspecified: Secondary | ICD-10-CM | POA: Diagnosis not present

## 2024-04-28 ENCOUNTER — Other Ambulatory Visit: Payer: Self-pay

## 2024-04-28 ENCOUNTER — Encounter: Payer: Self-pay | Admitting: Physician Assistant

## 2024-05-19 ENCOUNTER — Emergency Department

## 2024-05-19 ENCOUNTER — Other Ambulatory Visit: Payer: Self-pay

## 2024-05-19 ENCOUNTER — Emergency Department
Admission: EM | Admit: 2024-05-19 | Discharge: 2024-05-19 | Disposition: A | Discharge status: Home / Self Care | Attending: Emergency Medicine | Admitting: Emergency Medicine

## 2024-05-19 DIAGNOSIS — M25562 Pain in left knee: Secondary | ICD-10-CM | POA: Insufficient documentation

## 2024-05-19 DIAGNOSIS — I10 Essential (primary) hypertension: Secondary | ICD-10-CM | POA: Insufficient documentation

## 2024-05-19 DIAGNOSIS — Y92410 Unspecified street and highway as the place of occurrence of the external cause: Secondary | ICD-10-CM | POA: Diagnosis not present

## 2024-05-19 MED ORDER — CYCLOBENZAPRINE HCL 5 MG PO TABS: 5.0000 mg | ORAL_TABLET | Freq: Three times a day (TID) | ORAL | 0 refills | Status: AC | PRN
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
